# Patient Record
Sex: Male | Born: 1950 | Race: Black or African American | Hispanic: No | Marital: Married | Smoking: Never smoker
Health system: Southern US, Community
[De-identification: ages and names within clinical notes are randomized; demographics above are authoritative.]

## PROBLEM LIST (undated history)

## (undated) DIAGNOSIS — F329 Major depressive disorder, single episode, unspecified: Secondary | ICD-10-CM

## (undated) DIAGNOSIS — F0390 Unspecified dementia without behavioral disturbance: Secondary | ICD-10-CM

## (undated) DIAGNOSIS — F259 Schizoaffective disorder, unspecified: Secondary | ICD-10-CM

## (undated) DIAGNOSIS — I10 Essential (primary) hypertension: Secondary | ICD-10-CM

## (undated) DIAGNOSIS — E119 Type 2 diabetes mellitus without complications: Secondary | ICD-10-CM

---

## 2002-03-08 ENCOUNTER — Inpatient Hospital Stay (HOSPITAL_COMMUNITY): Admission: EM | Admit: 2002-03-08 | Discharge: 2002-03-15 | Payer: Self-pay | Admitting: Psychiatry

## 2004-03-13 ENCOUNTER — Ambulatory Visit: Payer: Self-pay | Admitting: Psychiatry

## 2004-03-13 ENCOUNTER — Inpatient Hospital Stay (HOSPITAL_COMMUNITY): Admission: AD | Admit: 2004-03-13 | Discharge: 2004-03-20 | Payer: Self-pay | Admitting: Psychiatry

## 2004-03-15 ENCOUNTER — Ambulatory Visit (HOSPITAL_COMMUNITY): Admission: RE | Admit: 2004-03-15 | Discharge: 2004-03-15 | Payer: Self-pay | Admitting: Psychiatry

## 2004-04-11 ENCOUNTER — Ambulatory Visit: Payer: Self-pay | Admitting: Psychiatry

## 2004-04-11 ENCOUNTER — Inpatient Hospital Stay (HOSPITAL_COMMUNITY): Admission: AD | Admit: 2004-04-11 | Discharge: 2004-04-26 | Payer: Self-pay | Admitting: Psychiatry

## 2004-06-24 ENCOUNTER — Inpatient Hospital Stay (HOSPITAL_COMMUNITY): Admission: AD | Admit: 2004-06-24 | Discharge: 2004-06-27 | Payer: Self-pay | Admitting: Psychiatry

## 2004-06-24 ENCOUNTER — Ambulatory Visit: Payer: Self-pay | Admitting: Psychiatry

## 2004-06-24 ENCOUNTER — Emergency Department (HOSPITAL_COMMUNITY): Admission: EM | Admit: 2004-06-24 | Discharge: 2004-06-24 | Payer: Self-pay | Admitting: *Deleted

## 2006-07-29 ENCOUNTER — Emergency Department (HOSPITAL_COMMUNITY): Admission: EM | Admit: 2006-07-29 | Discharge: 2006-07-29 | Payer: Self-pay | Admitting: Emergency Medicine

## 2006-07-29 ENCOUNTER — Inpatient Hospital Stay (HOSPITAL_COMMUNITY): Admission: AD | Admit: 2006-07-29 | Discharge: 2006-08-14 | Payer: Self-pay | Admitting: Psychiatry

## 2006-07-29 ENCOUNTER — Ambulatory Visit: Payer: Self-pay | Admitting: Psychiatry

## 2010-07-26 NOTE — Discharge Summary (Signed)
NAME:  Stanley Watkins, Stanley Watkins                   ACCOUNT NO.:  [id number]   MEDICAL RECORD NO.:  [id number]                   PATIENT TYPE:  IPS   LOCATION:  0404                                 FACILITY:  [facility]   PHYSICIAN:  Jeanice Lim, M.D.              DATE OF BIRTH:  03/09/1951   DATE OF ADMISSION:  03/08/2002  DATE OF DISCHARGE:  03/15/2002                                 DISCHARGE SUMMARY   IDENTIFYING DATA:  This is a 60 year old African-American male involuntarily  committed.  He was paranoid, hallucinating, noncompliant with medications,  behavior bizarre.   MEDICATIONS:  None.   DRUG ALLERGIES:  No known drug allergies.   PHYSICAL EXAMINATION:  GENERAL: Essentially within normal limits.  Difficult  to evaluate due to psychosis.  NEUROLOGIC: Nonfocal.   LABORATORY DATA:  Routine admission labs were essentially within normal  limits including CBC and CMET.   MENTAL STATUS EXAM:  Alert male, poor eye contact, casually dressed, mute.  Appeared somewhat withdrawn and possibly depressed versus negative symptoms.  Thought process: Difficult to evaluate due to mute presentation.  Cognitive:  The patient appeared to be most likely intact.  Judgment and insight: Poor.   ADMISSION DIAGNOSES:   AXIS I:  Schizoaffective disorder, depressed.   AXIS II:  None.   AXIS III:  None.   AXIS IV:  Moderate problems with primary support group.   AXIS V:  25/68   HOSPITAL COURSE:  The patient was admitted, ordered routine p.r.n.  medications, underwent further monitoring, and was encouraged to participate  in individual, group, and milieu therapy.  The patient was resumed on  Risperdal and given Ativan and Zyprexa p.r.n. agitation.  Risperdal was  optimized and changed to a wafer to ensure compliance and Cogentin p.r.n.  any extrapyramidal symptoms.  Risperdal was further titrated and Zoloft was  added for possible depressive symptoms.  Ativan was added due to old records  showing the patient had responded well to Ativan, antipsychotics, and  antidepressants.  The patient did show improvement tolerating medications  without side effects, participating some in aftercare planning.   CONDITION ON DISCHARGE:  The patient's condition on discharge was improved.  The patient was less paranoid, no overt psychotic symptoms, denied dangerous  ideation.  Mood was more euthymic.  Affect: Slightly brighter; the patient  no longer appeared to be in distress and was able to maintain eye contact.  The patient reported motivation to be compliant with medications and was  discharged.   DISCHARGE MEDICATIONS:  1. Zoloft 50 mg q.a.m.  2. Ativan 0.5 mg t.i.d. p.r.n. anxiety.  3. Risperdal 2 mg one half q.a.m., one half at 3 p.m., and one q.h.s.  4. Ambien one q.h.s. p.r.n.   FOLLOW UP:  The patient was to follow up at [facility] on January 8 with Dr. Lang Snow.   DISCHARGE DIAGNOSES:   AXIS I:  Schizoaffective disorder, depressed.  AXIS II:  None.   AXIS III:  None.   AXIS IV:  Moderate problems with primary support group.   AXIS V:  Global assessment of functioning on discharge was 59-55.                                                Jeanice Lim, M.D.    JEM/MEDQ  D:  03/28/2002  T:  03/28/2002  Job:  [id number]

## 2010-07-26 NOTE — H&P (Signed)
NAME:  Stanley Watkins, DIRR NO.:  [id number]   MEDICAL RECORD NO.:  [id number]          PATIENT TYPE:  IPS   LOCATION:  0401                          FACILITY:  [facility]   PHYSICIAN:  [name], M.D.      DATE OF BIRTH:  March 16, 1950   DATE OF ADMISSION:  06/24/2004  DATE OF DISCHARGE:                         PSYCHIATRIC ADMISSION ASSESSMENT   IDENTIFYING INFORMATION:  A 60 year old married African-American male,  involuntarily committed on June 24, 2004.   HISTORY OF PRESENT ILLNESS:  The patient presents with a history of  petition.  The patient was petitioned per his wife as the patient was not  drinking or eating for the past several days and not taking his medications.  The patient is mute and sleeping all the time.  Also reports that the  patient has had a 12 pound weight loss.  There are no specific stressors.  The patient again has been noncompliant with his medications for at least 2  weeks, no apparent suicide ideation or psychotic symptoms.   PAST PSYCHIATRIC HISTORY:  Third admission to [facility].  The  patient was here recently for similar symptoms.  He sees Dr. Allyne Gee at  [facility].   SOCIAL HISTORY:  He is a 60 year old married African-American male currently  living with his wife.  The remainder of social history is unclear at this  time.   FAMILY HISTORY:  Denies.   ALCOHOL DRUG HISTORY:  There is no apparent alcohol or drug use.   PAST MEDICAL HISTORY:  Primary care Josi Roediger is unknown. Medical problems  are none.   MEDICATIONS:  The patient was on Risperdal 1 mg in the morning, 2 mg at  bedtime, Celexa 20 mg daily, Cogentin 1 mg b.i.d. and a Risperdal Consta  injection which the patient apparently has not been taking.   DRUG ALLERGIES:  No known allergies.   PHYSICAL EXAMINATION:  The patient was assessed at [facility] ED for  concerns of dehydration.  The patient appears fairly hydrated.  He was  uncooperative in triage area in emergency department, would not allow his  pulse oximetry and temperature to be taken.  His temperature is 97.8, 87  heart rate, 16 respirations, blood pressure is 133/80.  180 pounds.  The  patient is currently sleeping, in no acute distress.  His urine drug screen  is negative.  CMET within normal limits.  Urinalysis was negative.  CBC is  within normal limits.   MENTAL STATUS EXAM:  The patient is in the bed, will not respond, catatonic.  The patient is mute.  Thought processes and mood and affect, unable to  ascertain.  The patient is a poor historian.  Judgment and poor, insight is  poor.   ADMISSION DIAGNOSES:   AXIS I:  Schizoaffective disorder   AXIS II:  Deferred.   AXIS III:  None.   AXIS IV:  Unclear at this time.   AXIS V:  Current is 20, past year 71.   PLAN:  Stabilize mood and thinking.  Will monitor intake.  Will contact wife  for background information and any significant stressors.  Will monitor labs  as needed.  The patient will be placed on 400 hall for close monitoring.  Will consider forced medications.  The patient is to be medication compliant  and to follow up with mental health.   TENTATIVE LENGTH OF CARE:  5-7 days.      JO/MEDQ  D:  06/27/2004  T:  06/27/2004  Job:  [id number]

## 2010-07-26 NOTE — Discharge Summary (Signed)
NAME:  Stanley Watkins, Stanley Watkins NO.:  [id number]   MEDICAL RECORD NO.:  [id number]          PATIENT TYPE:  IPS   LOCATION:  0407                          FACILITY:  [facility]   PHYSICIAN:  [name], M.D.      DATE OF BIRTH:  1950/05/09   DATE OF ADMISSION:  03/13/2004  DATE OF DISCHARGE:  03/20/2004                                 DISCHARGE SUMMARY   CHIEF COMPLAINT/HISTORY OF PRESENT ILLNESS:  This is the second admission to  [facility] for this 60 year old married African-American  male involuntarily committed.  He presents with a history of being  depressed, poor eating, poor sleeping, poor hygiene, not responding  verbally, isolating, withdrawing to a point that he was not functioning at  home for which inpatient treatment was recommended.  He has a history of  depression, had been hospitalized 2 years prior to this admission at [facility].  There was apparently a history of similar responses  when he was under stress in the past.   PAST PSYCHIATRIC HISTORY:  As already stated.  [facility]  in 2003 for psychosis.  He was paranoid, hallucinating and noncompliant with  medication.   PAST MEDICAL HISTORY:  Denies.   ALCOHOL OR DRUG USE:  There is no evidence of alcohol or drug use.   PAST SURGICAL HISTORY:  Noncontributory.   MEDICATIONS:  In December 2003, he was Zoloft 50 mg per day, Risperdal 2 mg  1/2 tablet in the morning and 1/2 tablet at 2 p.m. and 1 at night.  Ativan  0.5 mg three times a day.   PHYSICAL EXAMINATION:  Exam is performed with no acute findings.   LABORATORY:  CBC was within normal limits.  Blood chemistries, glucose 107.  Liver profile within normal limits.  TSH 2.277.  CT scan showed no acute  evidence of intracranial abnormalities.   MENTAL STATUS EXAM:  Reveals a male who was completely withdrawn, mute,  unable to cooperate with initial admission or following simple commands such  at  sit here, come into the room.  No eye contact.  No spontaneous verbal  communication.   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   AXIS II:  No diagnosis.   AXIS III:  No diagnosis.   AXIS IV:  Moderate.   AXIS V:  Global assessment of function on admission 20.  Highest within the  last year 60.   HOSPITAL COURSE:  He was involuntarily committed.  He was started in  supportive psychotherapy.  He was offered Ambien for sleep.  He was given  Zyprexa was placed at 5 in the morning and 5 at night.  He was given some  Ativan as needed for anxiety.  He was also started on Zoloft 25 mg per day.  Zyprexa was eventually discontinued.  He was placed on Risperdal.  As  already stated, initially he was unresponsive.  He would open his eyes when  his name was called, no eye contact.  Obvious evidencing self-neglect, not  taking a bath for awhile.  Dehydrated, refused fluids.  Eventually he was  able to accept fluids and food.  He would not answer questions.  He was  selective in whom he would initiate a couple of words.  He would not offer  more information.  There was psychomotor retardation.  We continued to be  sure that he was hydrated and he had enough nutrients.  He continued to  spend most of the time in bed, eyes closed, mute.  Continued to be isolated,  eye open, would not respond to questions.  Started being able to go to the  cafeteria to eat lunch and dinner and went back to bed.  He would not admit  if he was depressed or hearing voices.  There was an interview with his wife  over the phone.  Apparently, he has been experiencing depression for 17  years to his admission.  Apparently, he had a nervous breakdown, problems  with his job, paranoid of coworkers, neighbors, Nurse, adult.  He felt like he is  a newborn baby as a robot.  He had some delusions, so he was admitted to  [facility].  Over the past 17 years he has been hospitalized several   times at [facility], [place], [facility], __________.  Last episode was  Christmas 2 years prior to this admission.  Wife stated that he will change  _________ bottom on and off.  He has never been compliant with medications.  Apparently one of the triggers could be his interaction with his mother  during Christmas.  She suffers from Alzheimer's and is totally dependent on  others for care.  Apparently it was very hard for him to see her.  The wife  saw increased sleep, isolating, not eating, no showering.  He continued to  stabilize.  He was compliant with the medications and on January 11, he was  better, but he was wanting to be discharged.  It was felt that he would  recover faster if he was in more familiar surroundings.  He was endorsing  that there was no evidence of suicidal or homicidal ideation.  He was more  spontaneous.  He was eating.  He was sleeping, keeping up with his hygiene.  He felt that he was stable enough that he could be discharged to outpatient  treatment.   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   AXIS II:  No diagnosis.   AXIS III:  No diagnosis.   AXIS IV:  Moderate.   AXIS V:  Global assessment of function upon discharge 50.   DISCHARGE MEDICATIONS:  1.  Zoloft 50 mg per day.  2.  Ambien 10 at night for sleep.  3.  Risperdal M-tab 0.5, 1 in the morning and 2 at night.   FOLLOWUP:  Dr. Milagros Evener.      IL/MEDQ  D:  04/09/2004  T:  04/09/2004  Job:  [id number]

## 2010-07-26 NOTE — Discharge Summary (Signed)
NAME:  SIMPSON, PAULOS NO.:  [id number]   MEDICAL RECORD NO.:  [id number]          PATIENT TYPE:  IPS   LOCATION:  0401                          FACILITY:  [facility]   PHYSICIAN:  [name], M.D.      DATE OF BIRTH:  April 16, 1950   DATE OF ADMISSION:  06/24/2004  DATE OF DISCHARGE:  06/27/2004                                 DISCHARGE SUMMARY   CHIEF COMPLAINT AND PRESENTING ILLNESS:  This was the third admission to  [facility]  for this 60 year old married African-American  male, involuntarily committed.  Was petitioned per his wife as the patient  was not drinking or eating for the past several days and not taking his  medications.  He was mute and sleeping all the time.  He has had 12 pound  weight loss.  No specific stressors.  Noncompliant with the medications for  at least 2 weeks.   PAST PSYCHIATRIC HISTORY:  Third time [employer].  Sees Dr. Allyne Gee  at [facility].   ALCOHOL AND DRUG HISTORY:  No apparently alcohol or drug use.   PAST MEDICAL HISTORY:  Noncontributory.   MEDICATIONS:  Risperdal 1 mg in the morning, 2 at night, Celexa 20 mg per  day, Cogentin 1 mg twice a day, Risperdal Consta which he has not been  taking.   PHYSICAL EXAMINATION:  Performed, failed to show any acute findings.   LABORATORY WORKUP:  CBC:  White blood cells 4.6, hemoglobin 16.1.  Blood  chemistries were within normal limits.  Glucose 76.  Liver enzymes:  SGOT  16, SGPT 12. Drug screening negative for substances of abuse.   MENTAL STATUS EXAM:  Reveals a male, in bed, not responding, mute.  Thought  process and mood unable to ascertain.  Affect was pretty constricted, not as  spontaneous.  Endorsed no hallucinations.  Cognition could not be evaluated  due to his inability to communicate.   ADMISSION DIAGNOSES:   AXIS I:  Schizoaffective disorder versus major depression with psychotic  features.   AXIS II:  No  diagnosis.   AXIS III:  No diagnosis.   AXIS IV:  Moderate.   AXIS V:  Global assessment of function upon admission 20 highest global  assessment of function in past year 65.   COURSE IN HOSPITAL:  He was admitted and started on individual and group  psychotherapy.  He was given Ambien for sleep.  He was re-ordered Risperdal  Consta 37.5 IM and placed on Seroquel 150 mg per day and Cogentin 1 mg 3  times a day.  He was also given Ativan 0.5 4 times a day.  He seemed to be  responding to internal stimuli, not as spontaneous, not answering questions,  little eye contact.  On April 19 he was more communicative.  He said he was  doing okay, claimed he was taking his medications but the information we got  said that he was not.  He endorsed that he wanted to go home.  We wanted him  to take the Consta.  Initially he refused to, but later he agreed  and he  was given Consta 50 mg on April 19.  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  On April 20, he was endorsing that he  wanted to leave.  He got the IM and indeed he had communicated verbally more  so than the last time.  He was denying any suicidal or homicidal ideas.  He  was going to be followed by Dr. Allyne Gee and the [facility] Team.   DISCHARGE DIAGNOSES:   AXIS I:  Major depression with psychotic features versus schizoaffective  disorder, depressed.   AXIS II:  No diagnosis.   AXIS III:  No diagnosis.   AXIS IV:  Moderate.   AXIS V:  Global assessment of function upon discharge 45-50.   DISCHARGE MEDICATIONS:  1.  Zoloft 100 mg 1.5 daily.  2.  Cogentin 1 mg 3 times a day.  3.  Ativan 0.5 4 times a day.  4.  Risperdal M-Tab 1 mg twice a day and 2 mg at night.  5.  Consta 50 mg IM given April 19.   DISPOSITION:  Follow up at [facility].      IL/MEDQ  D:  07/27/2004  T:  07/27/2004  Job:  [id number]

## 2010-07-26 NOTE — Discharge Summary (Signed)
NAME:  Stanley Watkins, Stanley Watkins         ACCOUNT NO.:  [id number]   MEDICAL RECORD NO.:  [id number]          PATIENT TYPE:  IPS   LOCATION:  0302                          FACILITY:  [facility]   PHYSICIAN:  Anselm Jungling, MD  DATE OF BIRTH:  02/25/1961   DATE OF ADMISSION:  07/29/2006  DATE OF DISCHARGE:  08/14/2006                               DISCHARGE SUMMARY   IDENTIFYING DATA AND REASON FOR ADMISSION:  This was an inpatient  psychiatric admission for Stanley Watkins, a 60 year old married male  with a history of schizoaffective disorder.  He came to [place] as a client  of [facility], where he sees Dr. Allyne Gee.  He was  admitted due to severe psychotic decompensation involving mutism,  withdrawal, and decreased p.o. intake.  He had come to [place] on a regimen  of Invega and Zoloft, doses unclear.  Please refer to the admission note  for further details pertaining to the symptoms, circumstances and  history that led to his hospitalization.  He was given initial Axis I  diagnosis of schizoaffective disorder NOS, with catatonic presentation.   MEDICAL AND LABORATORY:  The patient was medically and physically  assessed by the psychiatric nurse practitioner.  He was in good health  without any active or chronic medical problems.  There were no  significant medical issues during this inpatient psychiatric stay.   HOSPITAL COURSE:  The patient was admitted to the adult inpatient  psychiatric service.  He presented as a very withdrawn, guarded male who  was well-nourished and normally developed.  He was completely  unresponsive verbally; although, his eyes were open, and he appeared to  be aware of the presence of others around him.   He had previously been on a regimen of Invega and had reportedly been  doing well with this, but apparently stopped taking his medication for  reasons that were never clear.  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   His treatment was challenging, in that he appeared to be taking his  medication during the first several days of his inpatient stay.  He  remained isolative, mute, withdrawn, and spending most of his time in  bed.  We were concerned about his p.o. intake.  Eventually, we found it  necessary to change from oral Risperdal to liquid Haldol concentrate to  make sure that he was getting adequate doses of antipsychotic  medication.  As the patient began to take medication more consistently,  he gradually became less withdrawn and more verbal.  His p.o. intake  improved.   Towards the end of his hospital stay, we began to discuss with the  patient the possibility of getting on Risperdal Consta, which would  obviate the need for daily oral medication.  The patient was reluctant  to agree to this, but his wife insisted, as she has had apparently made  experiences in the past of the patient going off of his medication and  then needing hospitalization.  The patient was discharged on the 17th  hospital day and agreed to the following aftercare plan.   AFTERCARE:  The  patient was to follow-up at the [facility] with an  appointment with Dr. Lang Snow on 08/18/2006.  The [facility] was to  provide other services as indicated.   DISCHARGE MEDICATIONS:  Invega 9 mg at bedtime, Cogentin 1 mg b.i.d.,  Zoloft 50 mg daily, and Risperdal Consta 50 mg IM every 2 weeks, next  due 08/28/2006.   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  AXIS II:  Deferred.  AXIS III:  No acute or chronic illnesses.  AXIS IV:  Stressors severe.  AXIS V:  GAF on discharge 60.      Anselm Jungling, MD  Electronically Signed     SPB/MEDQ  D:  08/26/2006  T:  08/27/2006  Job:  [id number]

## 2010-07-26 NOTE — H&P (Signed)
NAME:  NAJEEB, UPTAIN                   ACCOUNT NO.:  [id number]   MEDICAL RECORD NO.:  [id number]                   PATIENT TYPE:  IPS   LOCATION:  0404                                 FACILITY:  BH   PHYSICIAN:  Jeanice Lim, M.D.              DATE OF BIRTH:  Mar 21, 1950   DATE OF ADMISSION:  03/08/2002  DATE OF DISCHARGE:                         PSYCHIATRIC ADMISSION ASSESSMENT   IDENTIFYING INFORMATION:  This is 60 year old African-American male  involuntarily committed on March 08, 2002.   HISTORY OF PRESENT ILLNESS:  The patient presents on petition papers that  report patient has a 15-year history of paranoia, hallucinations,  withdrawal.  He has been noncompliant with his medications.  He has been  behaving bizarrely, changing clothes frequently, wandering the street and  has been extremely guarded.  Chart documents state that patient has  apparently been depressed over two deaths recently.  The patient was mute  and will not offer any information.   PAST PSYCHIATRIC HISTORY:  Fifteen-year history of psychiatric illness.  Sees Dr. Mila Homer at [facility].  Has been hospitalized eight  times at [facility].   SOCIAL HISTORY:  This is a 60 year old African-American male.  He is married  and works at the [employer].   FAMILY HISTORY:  Unclear.   ALCOHOL/DRUG HISTORY:  There is no apparent drug use.   PRIMARY CARE PHYSICIAN:  Unknown.   MEDICAL PROBLEMS:  None apparent.   MEDICATIONS:  None.   ALLERGIES:  No known allergies.   PHYSICAL EXAMINATION:  Deferred at this time due to patient's psychosis and  patient's unpredictable behavior.  He appears in no acute distress.  His  vital signs seem stable with temperature 97.6, pulse 100, respiratory rate  24, blood pressure 114/85 and he is 232 pounds.   LABORATORY DATA:  CBC is within normal limits.  CMET is within normal  limits.   MENTAL STATUS EXAM:  He is alert.  He  is standing on the side of the bed.  He has a food tray in front of him, most of the food is uneaten.  There is  no eye contact and no sign that patient is realizing I am talking to him.  He is mute.  He appears withdrawn and has a sad expression.  Thought  processes unable to ascertain hallucinations.  Cognitive function intact.  Memory is uncertain.  Judgment and insight is poor.   DIAGNOSES:    AXIS I:  1. Psychosis not otherwise specified.  2. Rule out schizoaffective disorder, depressed.   AXIS II:  Deferred.   AXIS III:  None.   AXIS IV:  Problems with primary support group, other psychosocial problems,  noncompliance with medication.   AXIS V:  Current 25; this past year 3.   PLAN:  Voluntary admit for psychosis.  Contract for safety.  Check every 15  minutes.  The patient to be on the [address].  Will resume Risperdal to  target psychotic symptoms.  Will contact the wife for background  information.  To stabilize mood and thinking to patient can be safe and  functional.  Return to prior living arrangements.  Have a family session  prior to discharge.  The patient to follow up with mental health and to be  medication-compliant.   TENTATIVE LENGTH OF STAY:  Four to six days or more depending on patient's  response to medication.      Landry Corporal, N.P.                       Jeanice Lim, M.D.    JO/MEDQ  D:  03/10/2002  T:  03/10/2002  Job:  [id number]

## 2010-07-26 NOTE — Discharge Summary (Signed)
NAME:  Stanley Watkins, WEINERT NO.:  [id number]   MEDICAL RECORD NO.:  [id number]          PATIENT TYPE:  IPS   LOCATION:  0406                          FACILITY:  [facility]   PHYSICIAN:  [name], M.D.      DATE OF BIRTH:  1951/01/17   DATE OF ADMISSION:  04/11/2004  DATE OF DISCHARGE:  04/26/2004                                 DISCHARGE SUMMARY   CHIEF COMPLAINT/HISTORY OF PRESENT ILLNESS:  This is patient's third  admission to [facility] for this 60 year old  married African-American male involuntarily committed.  Diagnosed with  schizo-affective disorder and has two recent hospitalizations, brought to  mental health by his wife endorsing that he was not functional, not  speaking, eating or drinking.  He has history of stressors of mother's  Alzheimer's disease, history of stopping medications when he feels better,  nonverbal, nonresponsive, cannot give a history upon admission.   PAST PSYCHIATRIC HISTORY:  Followed by Dr. Ezzard Flax, noncompliant.  Third  time to behavioral health center, last time was March 20, 2004.  He was  also hospitalized in 1984   ALCOHOL OR DRUG HISTORY:  Denies use or abuse of any substances.   PAST MEDICAL HISTORY:  Noncontributory.   MEDICATIONS:  1.  Risperdal M-tab 0.5 in the morning and 1 mg at night.  2.  Zoloft 50 mg per day.  3.  Ambien 10 mg at bedtime for sleep.   PHYSICAL EXAMINATION:  Performed and failed to show any acute findings.   LABORATORY WORKUP:  Labs were not repeated as he was recently hospitalized.   MENTAL STATUS EXAM:  Revealed a male with eyes open, blinks rapidly, no  spontaneous content, no eye contract, no verbal communications.  He did not  respond verbally or to simple commands.  Speech is nonverbal.  Mood  withdrawn, noncommunicative.  Thought process no spontaneous content.  Cognition could not be tested due to his being unresponsive verbally.   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   AXIS II:  No diagnosis.   AXIS III:  No diagnosis.   AXIS IV:  Moderate.   AXIS V:  Upon admission 15-20, highest in the last year 60.   COURSE IN HOSPITAL:  He was admitted and started in group psychotherapy.  He  was maintained on his medications, Ambien 10 at night, Risperdal M-tab 0.5  in the morning and 1 mg at night, Cogentin 2 mg as needed for EPS, Zoloft 50  mg per day.  He was also given Ativan as needed.  Risperdal was eventually  changed to 1 mg in the morning and 1.5 at night.  We continued to work with  the Zoloft; that was increased to 75 mg per day.  He was placed on Risperdal  Consta on February 7, and we continued to work with the Zoloft increasing it  as tolerated.  As already stated, initially he was noncommunicative, most of  the time lying in bed, not responsive to verbal stimuli, but able to look at  the  person wanting to interact.  He was compliant with taking his  medications.  Most of the hospitalization he remained verbally  uncommunicative, acknowledged depression.  He seemed to start sleeping  better.  With a lot of encouragement he started taking care of his hygiene.  There were some attempts to communicate with several staff members, but  would not elaborate.  It was almost like he would say a couple of words and  then not produce anything else.  He was visually engaged.  He seemed to  understand what he was being asked, seemed to be involved in some internal  stimuli.  He would deny if he was hearing voices or he was responding to  what seemed to delusional ideations.  As the hospitalization progressed, he  was a little bit more communicative with some staff.  There were attempts to  communicate verbally, and then he would stop, but he continued to be  compliant with medications.  We up to Zoloft 150 mg in the morning and  Risperdal M-tab 1 mg in the  afternoon and 2.5 mg at night.  He endorsed in  one of the better days that he wanted to be the way he was before, but  attempts to pursue this light of thought resulted in him not answering or  saying anything else.  We started trying to encourage him to endorse that he  would be discharged as soon as he was able to demonstrate that he could  communicate, but even then he was not able to do so.  We went up to 200 mg  of Zoloft.  We went up to Risperdal 3 mg per day.  We had him on Consta 25  mg IM.  Apparently he became a little more interactive with his wife.  It  was felt that he had obtained full benefit from the hospitalization, so on  April 26, 2004 he was discharged.  He will still hesitant, but able to  establish more of a conversation, had been compliant with his medication,  eating, drinking, taking care of his ADLs.  We went ahead and discharged him  to outpatient followup.   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   AXIS II:  No diagnosis.   AXIS III:  No diagnosis.   AXIS IV:  Moderate.   AXIS V:  45 on discharge.   DISCHARGE MEDICATIONS:  1.  Ativan 0.5 mg three times a day.  2.  Risperdal M-tabs 1 mg in the morning, in the afternoon, and 2 at night.  3.  Zoloft 200 mg per day.  4.  Cogentin 1 mg three times a day.  5.  Consta 37.5 IM every 2 weeks, last dose April 16, 2004.   FOLLOW UP:  Dr. Mikey Bussing at [facility].      IL/MEDQ  D:  05/28/2004  T:  05/28/2004  Job:  [id number]

## 2012-04-28 ENCOUNTER — Encounter (HOSPITAL_COMMUNITY): Payer: Self-pay | Admitting: Emergency Medicine

## 2012-04-28 ENCOUNTER — Emergency Department (HOSPITAL_COMMUNITY): Payer: Self-pay

## 2012-04-28 ENCOUNTER — Emergency Department (HOSPITAL_COMMUNITY)
Admission: EM | Admit: 2012-04-28 | Discharge: 2012-04-28 | Payer: Self-pay | Attending: Emergency Medicine | Admitting: Emergency Medicine

## 2012-04-28 DIAGNOSIS — Z008 Encounter for other general examination: Secondary | ICD-10-CM | POA: Insufficient documentation

## 2012-04-28 DIAGNOSIS — F319 Bipolar disorder, unspecified: Secondary | ICD-10-CM | POA: Insufficient documentation

## 2012-04-28 DIAGNOSIS — F411 Generalized anxiety disorder: Secondary | ICD-10-CM | POA: Insufficient documentation

## 2012-04-28 LAB — COMPREHENSIVE METABOLIC PANEL
ALT: 22 U/L (ref 0–53)
Alkaline Phosphatase: 80 U/L (ref 39–117)
BUN: 13 mg/dL (ref 6–23)
CO2: 26 mEq/L (ref 19–32)
Chloride: 100 mEq/L (ref 96–112)
GFR calc Af Amer: >90 mL/min (ref >90)
GFR calc non Af Amer: 78 mL/min — ABNORMAL LOW (ref >90)
Glucose, Bld: 140 mg/dL — ABNORMAL HIGH (ref 70–99)
Potassium: 3.7 mEq/L (ref 3.5–5.1)
Sodium: 136 mEq/L (ref 135–145)
Total Bilirubin: 0.2 mg/dL — ABNORMAL LOW (ref 0.3–1.2)
Total Protein: 7.8 g/dL (ref 6.0–8.3)

## 2012-04-28 LAB — CBC WITH DIFFERENTIAL/PLATELET
Basophils Relative: 0 % (ref 0–1)
Eosinophils Absolute: 0.3 10*3/uL (ref 0.0–0.7)
Hemoglobin: 15.1 g/dL (ref 13.0–17.0)
MCH: 27.7 pg (ref 26.0–34.0)
MCHC: 34.2 g/dL (ref 30.0–36.0)
Monocytes Absolute: 0.6 10*3/uL (ref 0.1–1.0)
Monocytes Relative: 7 % (ref 3–12)
Neutrophils Relative %: 59 % (ref 43–77)
RDW: 15.5 % (ref 11.5–15.5)

## 2012-04-28 LAB — URINALYSIS, ROUTINE W REFLEX MICROSCOPIC
Bilirubin Urine: NEGATIVE
Ketones, ur: NEGATIVE mg/dL
Nitrite: NEGATIVE
Protein, ur: NEGATIVE mg/dL
pH: 5.5 (ref 5.0–8.0)

## 2012-04-28 LAB — RAPID URINE DRUG SCREEN, HOSP PERFORMED: Opiates: NOT DETECTED

## 2012-04-28 NOTE — ED Notes (Signed)
Pt is IVC  Papers state pt has been literally where he will not communicate, not performing daily care, eating very little

## 2012-04-28 NOTE — ED Provider Notes (Signed)
History    This chart was scribed for Stanley Gourd, PA-C, non-physician practitioner working with Stanley Anger, DO by Stanley Watkins, ED Scribe. This patient was seen in room WTR4/WLPT4 and the patient's care was started at 2043.    CSN: [id number]  Arrival date & time 04/28/12  2000   First MD Initiated Contact with Patient 04/28/12 2043      Chief Complaint  Patient presents with  . Medical Clearance   Level V Caveat: Pt is non-verbal   History provided by: IVC papers and accompaning security. No language interpreter was used.   Stanley Watkins is a 62 y.o. male who presents to the Emergency Department for medical clearance. Deputy picked the pt up at the home and took the him to [place]. Wife took out the IVC papers on the pt due to him not talking, not eating or taking care of himself in regards to personal hygiene. Pt was sent here from [facility] for medical clearance. IVC papers states that the pt suffers from manic depression and anxiety and that the pt has been non-verbal, occasionally whispers to himself, has no regard for personal hygiene, eating little and calls people on the phone but doesn't speak. IVC papers state that the he is danger to himself.   History reviewed. No pertinent past medical history.  No past surgical history on file.  No family history on file.  History  Substance Use Topics  . Smoking status: Not on file  . Smokeless tobacco: Not on file  . Alcohol Use: Not on file      Review of Systems  Unable to perform ROS: Patient nonverbal    Allergies  Review of patient's allergies indicates not on file.  Home Medications  No current outpatient prescriptions on file.  There were no vitals taken for this visit.  Physical Exam  Nursing note and vitals reviewed. Constitutional: He appears well-developed and well-nourished. No distress.  HENT:  Head: Normocephalic and atraumatic.  Eyes: Conjunctivae are normal. Pupils are  equal, round, and reactive to light.  Neck: Normal range of motion. Neck supple. No tracheal deviation present.  Cardiovascular: Normal rate, regular rhythm and normal heart sounds.   Pulmonary/Chest: Effort normal and breath sounds normal. No respiratory distress.  Abdominal: Soft. Bowel sounds are normal. He exhibits no distension.  Musculoskeletal: Normal range of motion. He exhibits no edema.  Neurological: He is alert.  Skin: Skin is warm and dry.  Psychiatric: He is withdrawn. He is noncommunicative.  Poor eye contact    ED Course  Procedures (including critical care time)  COORDINATION OF CARE:  21:00-Preformed physical exam. A chest x-ray, ethanol and drug screen, CMP, CBC and UA have been ordered.    Results for orders placed during the hospital encounter of 04/28/12  URINE RAPID DRUG SCREEN (HOSP PERFORMED)      Result Value Range   Opiates NONE DETECTED  NONE DETECTED   Cocaine NONE DETECTED  NONE DETECTED   Benzodiazepines NONE DETECTED  NONE DETECTED   Amphetamines NONE DETECTED  NONE DETECTED   Tetrahydrocannabinol NONE DETECTED  NONE DETECTED   Barbiturates NONE DETECTED  NONE DETECTED  COMPREHENSIVE METABOLIC PANEL      Result Value Range   Sodium 136  135 - 145 mEq/L   Potassium 3.7  3.5 - 5.1 mEq/L   Chloride 100  96 - 112 mEq/L   CO2 26  19 - 32 mEq/L   Glucose, Bld 140 (*) 70 - 99 mg/dL  BUN 13  6 - 23 mg/dL   Creatinine, Ser 8.29  0.50 - 1.35 mg/dL   Calcium 9.0  8.4 - 56.2 mg/dL   Total Protein 7.8  6.0 - 8.3 g/dL   Albumin 3.5  3.5 - 5.2 g/dL   AST 17  0 - 37 U/L   ALT 22  0 - 53 U/L   Alkaline Phosphatase 80  39 - 117 U/L   Total Bilirubin 0.2 (*) 0.3 - 1.2 mg/dL   GFR calc non Af Amer 78 (*) >90 mL/min   GFR calc Af Amer >90  >90 mL/min  URINALYSIS, ROUTINE W REFLEX MICROSCOPIC      Result Value Range   Color, Urine YELLOW  YELLOW   APPearance CLEAR  CLEAR   Specific Gravity, Urine 1.021  1.005 - 1.030   pH 5.5  5.0 - 8.0   Glucose, UA  NEGATIVE  NEGATIVE mg/dL   Hgb urine dipstick NEGATIVE  NEGATIVE   Bilirubin Urine NEGATIVE  NEGATIVE   Ketones, ur NEGATIVE  NEGATIVE mg/dL   Protein, ur NEGATIVE  NEGATIVE mg/dL   Urobilinogen, UA 1.0  0.0 - 1.0 mg/dL   Nitrite NEGATIVE  NEGATIVE   Leukocytes, UA NEGATIVE  NEGATIVE  CBC WITH DIFFERENTIAL      Result Value Range   WBC 7.9  4.0 - 10.5 K/uL   RBC 5.46  4.22 - 5.81 MIL/uL   Hemoglobin 15.1  13.0 - 17.0 g/dL   HCT 13.0  86.5 - 78.4 %   MCV 80.8  78.0 - 100.0 fL   MCH 27.7  26.0 - 34.0 pg   MCHC 34.2  30.0 - 36.0 g/dL   RDW 69.6  29.5 - 28.4 %   Platelets 176  150 - 400 K/uL   Neutrophils Relative 59  43 - 77 %   Neutro Abs 4.7  1.7 - 7.7 K/uL   Lymphocytes Relative 30  12 - 46 %   Lymphs Abs 2.4  0.7 - 4.0 K/uL   Monocytes Relative 7  3 - 12 %   Monocytes Absolute 0.6  0.1 - 1.0 K/uL   Eosinophils Relative 4  0 - 5 %   Eosinophils Absolute 0.3  0.0 - 0.7 K/uL   Basophils Relative 0  0 - 1 %   Basophils Absolute 0.0  0.0 - 0.1 K/uL  ETHANOL      Result Value Range   Alcohol, Ethyl (B) <11  0 - 11 mg/dL    Dg Chest 2 View  1/32/4401  *RADIOLOGY REPORT*  Clinical Data: Medical clearance.  CHEST - 2 VIEW  Comparison: None available.  Findings: The heart size is normal.  Minimal left basilar atelectasis is present.  The lungs are otherwise clear. Degenerative changes are noted in the thoracic spine.  IMPRESSION:  1.  Mild left basilar atelectasis. 2.  Degenerative changes of the thoracic spine.   Original Report Authenticated By: Stanley Watkins, M.D.      1. Medical clearance for psychiatric admission       MDM  Patient medically cleared to return to [facility].    I personally performed the services described in this documentation, which was scribed in my presence. The recorded information has been reviewed and is accurate.     Stanley Mace, PA-C 04/28/12 2203

## 2012-04-28 NOTE — ED Notes (Signed)
Spoke to [place] on the phone  Told pt could return to facility  Copy of labs, ekg, and xray report sent with pt  Pt returned to [facility] with Marketing executive

## 2012-04-28 NOTE — ED Notes (Signed)
Pt sent here from [facility] for med clearance with [place] security  Pt will not answer questions in triage regarding medical history  Pt will not make eye contact

## 2012-04-29 NOTE — ED Provider Notes (Signed)
Medical screening examination/treatment/procedure(s) were performed by non-physician practitioner and as supervising physician I was immediately available for consultation/collaboration.   Laray Anger, DO 04/29/12 1144

## 2012-11-09 ENCOUNTER — Encounter (HOSPITAL_COMMUNITY): Payer: Self-pay | Admitting: Emergency Medicine

## 2012-11-09 ENCOUNTER — Emergency Department (HOSPITAL_COMMUNITY)
Admission: EM | Admit: 2012-11-09 | Discharge: 2012-11-11 | Disposition: A | Discharge status: Home / Self Care | Payer: 59 | Attending: Emergency Medicine | Admitting: Emergency Medicine

## 2012-11-09 DIAGNOSIS — F259 Schizoaffective disorder, unspecified: Secondary | ICD-10-CM | POA: Insufficient documentation

## 2012-11-09 DIAGNOSIS — F29 Unspecified psychosis not due to a substance or known physiological condition: Secondary | ICD-10-CM | POA: Insufficient documentation

## 2012-11-09 DIAGNOSIS — Z8659 Personal history of other mental and behavioral disorders: Secondary | ICD-10-CM | POA: Insufficient documentation

## 2012-11-09 DIAGNOSIS — F438 Other reactions to severe stress: Secondary | ICD-10-CM

## 2012-11-09 DIAGNOSIS — F4320 Adjustment disorder, unspecified: Secondary | ICD-10-CM

## 2012-11-09 HISTORY — DX: Type 2 diabetes mellitus without complications: E11.9

## 2012-11-09 HISTORY — DX: Major depressive disorder, single episode, unspecified: F32.9

## 2012-11-09 HISTORY — DX: Essential (primary) hypertension: I10

## 2012-11-09 HISTORY — DX: Schizoaffective disorder, unspecified: F25.9

## 2012-11-09 LAB — COMPREHENSIVE METABOLIC PANEL
AST: 18 U/L (ref 0–37)
Albumin: 4.1 g/dL (ref 3.5–5.2)
Calcium: 9.6 mg/dL (ref 8.4–10.5)
Creatinine, Ser: 1.18 mg/dL (ref 0.50–1.35)

## 2012-11-09 LAB — CBC
HCT: 45.9 % (ref 39.0–52.0)
Hemoglobin: 15.5 g/dL (ref 13.0–17.0)
MCH: 27.6 pg (ref 26.0–34.0)
MCHC: 33.8 g/dL (ref 30.0–36.0)
MCV: 81.7 fL (ref 78.0–100.0)
Platelets: 188 K/uL (ref 150–400)
RBC: 5.62 MIL/uL (ref 4.22–5.81)
RDW: 14.9 % (ref 11.5–15.5)
WBC: 6.4 K/uL (ref 4.0–10.5)

## 2012-11-09 LAB — RAPID URINE DRUG SCREEN, HOSP PERFORMED
Amphetamines: NOT DETECTED
Cocaine: NOT DETECTED
Opiates: NOT DETECTED
Tetrahydrocannabinol: NOT DETECTED

## 2012-11-09 LAB — ETHANOL: Alcohol, Ethyl (B): <11 mg/dL (ref 0–11)

## 2012-11-09 MED ORDER — POTASSIUM CHLORIDE CRYS ER 20 MEQ PO TBCR: 40.0000 meq | EXTENDED_RELEASE_TABLET | Freq: Once | ORAL | Status: DC

## 2012-11-09 MED ORDER — NICOTINE 21 MG/24HR TD PT24
21.0000 mg | MEDICATED_PATCH | Freq: Every day | TRANSDERMAL | Status: DC
  Administered 2012-11-10: 21 mg via TRANSDERMAL
  Filled 2012-11-09: qty 1

## 2012-11-09 MED ORDER — ZOLPIDEM TARTRATE 5 MG PO TABS: 5.0000 mg | ORAL_TABLET | Freq: Every evening | ORAL | Status: DC | PRN

## 2012-11-09 MED ORDER — LORAZEPAM 1 MG PO TABS
1.0000 mg | ORAL_TABLET | Freq: Three times a day (TID) | ORAL | Status: DC | PRN
  Administered 2012-11-09: 1 mg via ORAL
  Filled 2012-11-09 (×3): qty 1

## 2012-11-09 MED ORDER — ACETAMINOPHEN 325 MG PO TABS: 650.0000 mg | ORAL_TABLET | ORAL | Status: DC | PRN

## 2012-11-09 MED ORDER — IBUPROFEN 200 MG PO TABS: 600.0000 mg | ORAL_TABLET | Freq: Three times a day (TID) | ORAL | Status: DC | PRN

## 2012-11-09 NOTE — ED Notes (Signed)
Attempted to speak with pt., pt. Completely nonverbal, minimal eye contact.  Explained where the bathroom and call bell are.  Will continue to monitor pt. And assist when I can.

## 2012-11-09 NOTE — ED Notes (Signed)
Pt brought here by sheriff with IVC, pt won't talk, wife states pt is a danger to himself and others

## 2012-11-09 NOTE — ED Notes (Signed)
Oriented pt. To psych ED. 

## 2012-11-09 NOTE — ED Notes (Signed)
Pt will not converse with any staff; unable to assess psych assessment; pt does seem agitated, constantly moving leg and will not make eye contact or acknowledge staff presence
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
## 2012-11-09 NOTE — ED Provider Notes (Signed)
See prior note   Ward Givens, MD 11/09/12 1732

## 2012-11-09 NOTE — ED Provider Notes (Signed)
CSN: [id number]     Arrival date & time 11/09/12  1406 History   First MD Initiated Contact with Patient 11/09/12 1506     Chief Complaint  Patient presents with  . Medical Clearance   (Consider location/radiation/quality/duration/timing/severity/associated sxs/prior Treatment) HPI Stanley Watkins is a 63 y.o. male with history of schizoaffective disorder, who presents to ED by [employer] with IVC papers taken out by his wife. Pt refuses to talk. He does not answer any questions or even nod yes or no. According to the officer and wife, pt has not been sleeping well, poor hygiene, having hallucinations, having anger management problems. Pt with history tof the same, with prior IVCs and admissions to psychiatric facilities. No medical problems otherwise.    No past medical history on file. No past surgical history on file. No family history on file. History  Substance Use Topics  . Smoking status: Not on file  . Smokeless tobacco: Not on file  . Alcohol Use: Not on file    Review of Systems  Unable to perform ROS: Psychiatric disorder    Allergies  Review of patient's allergies indicates not on file.  Home Medications  No current outpatient prescriptions on file. BP 147/94  Pulse 90  Temp(Src) 98.7 F (37.1 C) (Oral)  Resp 20  SpO2 95% Physical Exam  Nursing note and vitals reviewed. Constitutional: He appears well-developed and well-nourished. No distress.  HENT:  Head: Normocephalic and atraumatic.  Eyes: Conjunctivae are normal.  Neck: Neck supple.  Cardiovascular: Normal rate, regular rhythm and normal heart sounds.   Pulmonary/Chest: Effort normal. No respiratory distress. He has no wheezes. He has no rales.  Abdominal: Bowel sounds are normal. He exhibits no distension.  Musculoskeletal: He exhibits no edema.  Neurological: He is alert.  Skin: Skin is warm and dry.  Psychiatric:  Pt will not answer any questions.    ED Course  Procedures (including critical  care time) Labs Review Results for orders placed during the hospital encounter of 11/09/12  CBC      Result Value Range   WBC 6.4  4.0 - 10.5 K/uL   RBC 5.62  4.22 - 5.81 MIL/uL   Hemoglobin 15.5  13.0 - 17.0 g/dL   HCT 40.9  81.1 - 91.4 %   MCV 81.7  78.0 - 100.0 fL   MCH 27.6  26.0 - 34.0 pg   MCHC 33.8  30.0 - 36.0 g/dL   RDW 78.2  95.6 - 21.3 %   Platelets 188  150 - 400 K/uL  COMPREHENSIVE METABOLIC PANEL      Result Value Range   Sodium 135  135 - 145 mEq/L   Potassium 3.3 (*) 3.5 - 5.1 mEq/L   Chloride 101  96 - 112 mEq/L   CO2 24  19 - 32 mEq/L   Glucose, Bld 109 (*) 70 - 99 mg/dL   BUN 11  6 - 23 mg/dL   Creatinine, Ser 0.86  0.50 - 1.35 mg/dL   Calcium 9.6  8.4 - 57.8 mg/dL   Total Protein 8.0  6.0 - 8.3 g/dL   Albumin 4.1  3.5 - 5.2 g/dL   AST 18  0 - 37 U/L   ALT 19  0 - 53 U/L   Alkaline Phosphatase 71  39 - 117 U/L   Total Bilirubin 0.3  0.3 - 1.2 mg/dL   GFR calc non Af Amer 65 (*) >90 mL/min   GFR calc Af Amer 76 (*) >90 mL/min  ETHANOL      Result Value Range   Alcohol, Ethyl (B) <11  0 - 11 mg/dL  URINE RAPID DRUG SCREEN (HOSP PERFORMED)      Result Value Range   Opiates NONE DETECTED  NONE DETECTED   Cocaine NONE DETECTED  NONE DETECTED   Benzodiazepines NONE DETECTED  NONE DETECTED   Amphetamines NONE DETECTED  NONE DETECTED   Tetrahydrocannabinol NONE DETECTED  NONE DETECTED   Barbiturates NONE DETECTED  NONE DETECTED   No results found.    MDM   1. Adjustment reaction, other   2. Psychosis     Patient with history of schizoaffective disorder. Here with IVC papers taken on by his wife who believes that patient is psychotic and harm to himself or others. Patient is currently mute and will not talk to me or any nursing staff. Reviewed his records it appears that the mucous part of his psychotic episodes. I have medically cleared him. Spoke with asked who will assess patient.  Filed Vitals:   11/09/12 1428  BP: 147/94  Pulse: 90  Temp:  98.7 F (37.1 C)  TempSrc: Oral  Resp: 20  SpO2: 95%       Adriene Padula A Hilarie Sinha, PA-C 11/09/12 1714

## 2012-11-09 NOTE — ED Notes (Signed)
Notified EDP of pt.'s B/P.  Dr. Lynelle Doctor on phone, Dr. Rubin Payor will relay message to Dr. Lynelle Doctor.  New orders to redo B/P in 30 minutes., Ativan, 1mg  given.

## 2012-11-09 NOTE — Consult Note (Signed)
Reason for Consult: Patient Stanley Watkins due to threat to himself and others Referring Physician: ED MD  Stanley Watkins is an 62 y.o. male.  HPI: Patient was IVC'd by his wife as a threat to himself and others.  However, he will not talk on assessment.  He looked at this assessor when his name was called and started twirling his hair with slight pulls but never answered or spoke.  At the end of asking questions, he sighed and looked bored.  No past medical history on file.  No past surgical history on file.  No family history on file.  Social History:  has no tobacco, alcohol, and drug history on file.  Allergies: Not on File  Medications: I have reviewed the patient's current medications.  Results for orders placed during the hospital encounter of 11/09/12 (from the past 48 hour(s))  CBC     Status: None   Collection Time    11/09/12  3:00 PM      Result Value Range   WBC 6.4  4.0 - 10.5 K/uL   RBC 5.62  4.22 - 5.81 MIL/uL   Hemoglobin 15.5  13.0 - 17.0 g/dL   HCT 47.8  29.5 - 62.1 %   MCV 81.7  78.0 - 100.0 fL   MCH 27.6  26.0 - 34.0 pg   MCHC 33.8  30.0 - 36.0 g/dL   RDW 30.8  65.7 - 84.6 %   Platelets 188  150 - 400 K/uL  COMPREHENSIVE METABOLIC PANEL     Status: Abnormal   Collection Time    11/09/12  3:00 PM      Result Value Range   Sodium 135  135 - 145 mEq/L   Potassium 3.3 (*) 3.5 - 5.1 mEq/L   Chloride 101  96 - 112 mEq/L   CO2 24  19 - 32 mEq/L   Glucose, Bld 109 (*) 70 - 99 mg/dL   BUN 11  6 - 23 mg/dL   Creatinine, Ser 9.62  0.50 - 1.35 mg/dL   Calcium 9.6  8.4 - 95.2 mg/dL   Total Protein 8.0  6.0 - 8.3 g/dL   Albumin 4.1  3.5 - 5.2 g/dL   AST 18  0 - 37 U/L   ALT 19  0 - 53 U/L   Alkaline Phosphatase 71  39 - 117 U/L   Total Bilirubin 0.3  0.3 - 1.2 mg/dL   GFR calc non Af Amer 65 (*) >90 mL/min   GFR calc Af Amer 76 (*) >90 mL/min   Comment: (NOTE)     The eGFR has been calculated using the CKD EPI equation.     This calculation has not been  validated in all clinical situations.     eGFR's persistently <90 mL/min signify possible Chronic Kidney     Disease.  ETHANOL     Status: None   Collection Time    11/09/12  3:00 PM      Result Value Range   Alcohol, Ethyl (B) <11  0 - 11 mg/dL   Comment:            LOWEST DETECTABLE LIMIT FOR     SERUM ALCOHOL IS 11 mg/dL     FOR MEDICAL PURPOSES ONLY  URINE RAPID DRUG SCREEN (HOSP PERFORMED)     Status: None   Collection Time    11/09/12  3:01 PM      Result Value Range   Opiates NONE DETECTED  NONE DETECTED  Cocaine NONE DETECTED  NONE DETECTED   Benzodiazepines NONE DETECTED  NONE DETECTED   Amphetamines NONE DETECTED  NONE DETECTED   Tetrahydrocannabinol NONE DETECTED  NONE DETECTED   Barbiturates NONE DETECTED  NONE DETECTED   Comment:            DRUG SCREEN FOR MEDICAL PURPOSES     ONLY.  IF CONFIRMATION IS NEEDED     FOR ANY PURPOSE, NOTIFY LAB     WITHIN 5 DAYS.                LOWEST DETECTABLE LIMITS     FOR URINE DRUG SCREEN     Drug Class       Cutoff (ng/mL)     Amphetamine      1000     Barbiturate      200     Benzodiazepine   200     Tricyclics       300     Opiates          300     Cocaine          300     THC              50    No results found.  ROS Blood pressure 147/94, pulse 90, temperature 98.7 F (37.1 C), temperature source Oral, resp. rate 20, SpO2 95.00%. Physical Exam:  Completed in the ED, reviewed, concur with findings  Assessment/Plan: 1.  Monitor for 24 hours and try to get the patient to talk.  Nanine Means, PMH-NP 11/09/2012, 4:03 PM

## 2012-11-09 NOTE — ED Notes (Signed)
Medical necessity form taken to physician office to be filled out by Dr. Devoria Albe.

## 2012-11-09 NOTE — ED Notes (Signed)
Pt will not answer questions about hx or allergies

## 2012-11-09 NOTE — ED Notes (Signed)
The B/P at 1811 was manual.

## 2012-11-09 NOTE — ED Notes (Signed)
Notified EDP of pt.'s B/P.  Ordered pharm rec to be done asap, redo B/P at 2000, and notify EDP.

## 2012-11-09 NOTE — ED Notes (Signed)
Pt. Ate 2 small pkg.s of crackers and some applesauce.  Drank a few sips of water.

## 2012-11-09 NOTE — ED Provider Notes (Signed)
She brought to emergency department for IVC papers by his wife. He has a history of psychosis and he has episodes where he becomes almost catatonic. Patient is lying in bed and appears to be watching TV. He does not make eye contact. He is nonverbal. However the nurse does report he did feed himself and ate some applesauce and drink on his own.  Medical screening examination/treatment/procedure(s) were conducted as a shared visit with non-physician practitioner(s) and myself.  I personally evaluated the patient during the encounter  Devoria Albe, MD, Franz Dell, MD 11/09/12 [id number]

## 2012-11-09 NOTE — ED Notes (Signed)
Per patient wife [name] patient had written notes stating that he was ready to end everything. Wife also patient reports that patient has been making statements that people was following him and tapping his cell phone. Wife also states that patient has been non-verbal since February and out of work due to this since February. Wife reports that patient will not follow up with his doctors or take his medications. Wife reports that patient use to take risperdal constant injection but he also stop that too.

## 2012-11-10 ENCOUNTER — Encounter (HOSPITAL_COMMUNITY): Payer: Self-pay | Admitting: Emergency Medicine

## 2012-11-10 DIAGNOSIS — F29 Unspecified psychosis not due to a substance or known physiological condition: Secondary | ICD-10-CM

## 2012-11-10 MED ORDER — RISPERIDONE 1 MG PO TBDP
1.0000 mg | ORAL_TABLET | Freq: Two times a day (BID) | ORAL | Status: DC
  Filled 2012-11-10 (×4): qty 1

## 2012-11-10 MED ORDER — ZIPRASIDONE MESYLATE 20 MG IM SOLR
20.0000 mg | Freq: Once | INTRAMUSCULAR | Status: AC
  Administered 2012-11-10: 20 mg via INTRAMUSCULAR
  Filled 2012-11-10: qty 20

## 2012-11-10 MED ORDER — SERTRALINE HCL 50 MG PO TABS
25.0000 mg | ORAL_TABLET | Freq: Every day | ORAL | Status: DC
  Filled 2012-11-10: qty 1

## 2012-11-10 MED ORDER — RISPERIDONE 2 MG PO TBDP
2.0000 mg | ORAL_TABLET | Freq: Every day | ORAL | Status: DC
  Filled 2012-11-10 (×2): qty 1

## 2012-11-10 NOTE — BH Assessment (Signed)
Received a telephone call from [facility] in intake at [facility]. She reports that pt's information is currently under review and she will contact TTS once pt is reviewed by their doctor.   Glorious Peach, MS, LCASA Assessment Counselor

## 2012-11-10 NOTE — ED Notes (Signed)
Attempted to give pt PO medication.  Pt would not take meds.

## 2012-11-10 NOTE — Progress Notes (Signed)
[facility] provided pt with a [employer] Card application.  °

## 2012-11-10 NOTE — ED Notes (Signed)
[employer] and security called to bedside for medication administration.

## 2012-11-10 NOTE — ED Notes (Signed)
Up to bathroom x2 today. Ate some grits and eggs and drank the OJ for breakfast tray. Unsure of lunch tray-did not see it.

## 2012-11-10 NOTE — ED Notes (Signed)
Pt refuses to speak with staff.  Pt refuses to cooperate.  2 staff members attempted to get pt's BP.  Pt refused.

## 2012-11-10 NOTE — BH Assessment (Signed)
Assessment Note  Stanley Watkins is a 62 y.o. male who presents to [facility] with psychosis.  The following assessment is has been completed with collateral information gathered from pt.'s spouse, nurse and IVC petition.  Pt is under currently under a psychiatrist care--Dr. Tomasa Rand with [facility] and has stopped taking his mediations since approx March 2014 and stopped verbally communicating since February 2014; refuses to attend scheduled psychiatric appointments. Pt has been communicating by leaving notes round the home the home.  Pt has not spoken to medical staff since arriving to [facility].  Pt.'s spouse reports poor hygiene, eating/sleeping habits and AVH/Delusional thought process--thinks that the mole on his neck has been put there by the devil and he's being controlled by that mole; thinks he being spied on by the government by the use of his mobile phone and people are following him.  Pt wrote a note to his wife asking her not to send him to hell because she controls his mind.  Pt is employed with the [employer] and has been out of work for several months since he stopped communicating.  Pt has hx of schizoaffective disorder.    Axis I: Psychotic Disorder NOS Axis II: Deferred Axis III:  Past Medical History  Diagnosis Date  . Diabetes mellitus without complication     pre diabetes  . Schizoaffective disorder   . Depression, major   . Hypertension     non complaint   Axis IV: other psychosocial or environmental problems, problems related to social environment and problems with access to health care services Axis V: 11-20 some danger of hurting self or others possible OR occasionally fails to maintain minimal personal hygiene OR gross impairment in communication  Past Medical History:  Past Medical History  Diagnosis Date  . Diabetes mellitus without complication     pre diabetes  . Schizoaffective disorder   . Depression, major   . Hypertension     non complaint     History reviewed. No pertinent past surgical history.  Family History:  Family History  Problem Relation Age of Onset  . Diabetes Mother   . Cancer Father   . Schizophrenia Maternal Aunt     Social History:  reports that he has never smoked. He has never used smokeless tobacco. He reports that he does not drink alcohol. His drug history is not on file.  Additional Social History:  Alcohol / Drug Use Pain Medications: See MAR  Prescriptions: See MAR  Over the Counter: See MAR  History of alcohol / drug use?: No history of alcohol / drug abuse Longest period of sobriety (when/how long): Unable to assess  CIWA: CIWA-Ar BP: 131/87 mmHg Pulse Rate: 85 COWS:    Allergies: No Known Allergies  Home Medications:  (Not in a hospital admission)  OB/GYN Status:  No LMP for male patient.  General Assessment Data [facility] of Assessment: [facility] Is this a Tele or Face-to-Face Assessment?: Tele Assessment Is this an Initial Assessment or a Re-assessment for this encounter?: Initial Assessment Living Arrangements: Spouse/significant other Can pt return to current living arrangement?: Yes Admission Status: Involuntary Is patient capable of signing voluntary admission?: No Transfer from: Acute Hospital Referral Source: MD  Medical Screening Exam [facility] ONLY) Medical Exam completed: No Reason for MSE not completed: Other: (None )  [facility] Crisis Care Plan Living Arrangements: Spouse/significant other Name of Psychiatrist: Dr. Syble Creek  Name of Therapist: Unk   Education Status Is patient currently in school?: No Current Grade:  None  Highest grade of school patient has completed: None  Name of school: None  Contact person: None   Risk to self Suicidal Ideation: No Suicidal Intent: No Is patient at risk for suicide?: No Suicidal Plan?: No Access to Means: No What has been your use of drugs/alcohol within the last 12 months?: None  Previous  Attempts/Gestures: No How many times?: 0 Other Self Harm Risks: None  Triggers for Past Attempts: None known Intentional Self Injurious Behavior: None Family Suicide History: No Recent stressful life event(s): Other (Comment) (has not verbally communicating since February, writes notes ) Persecutory voices/beliefs?: No Depression: No Depression Symptoms:  (None reported ) Substance abuse history and/or treatment for substance abuse?: No Suicide prevention information given to non-admitted patients: Not applicable  Risk to Others Homicidal Ideation: No Thoughts of Harm to Others: No Current Homicidal Intent: No Current Homicidal Plan: No Access to Homicidal Means: No Identified Victim: None  History of harm to others?: No Assessment of Violence: None Noted Violent Behavior Description: None  Does patient have access to weapons?: No Criminal Charges Pending?: No Does patient have a court date: No  Psychosis Hallucinations: Visual;Auditory (Unk if with command ) Delusions: Unspecified;Persecutory (Paranoid )  Mental Status Report Appear/Hygiene: Disheveled Eye Contact: Poor (No eye contact at all ) Motor Activity: Unremarkable Speech: Unable to assess Level of Consciousness: Alert Mood: Apprehensive;Preoccupied Affect: Unable to Assess Anxiety Level: None Thought Processes:  (UTA ) Judgement: Impaired Orientation: Unable to assess Obsessive Compulsive Thoughts/Behaviors: None  Cognitive Functioning Concentration: Normal Memory:  (Unable to assess ) IQ: Average Insight: Poor Impulse Control: Poor Appetite: Fair Weight Loss: 0 Weight Gain: 0 Sleep: No Change Total Hours of Sleep:  (Unable to assess ) Vegetative Symptoms: None  ADLScreening [facility]) Patient's cognitive ability adequate to safely complete daily activities?: Yes Patient able to express need for assistance with ADLs?: Yes Independently performs ADLs?: Yes (appropriate for  developmental age)  Prior Inpatient Therapy Prior Inpatient Therapy: No (Unk ) Prior Therapy Dates: Unk  Prior Therapy Facilty/Provider(s): Unk  Reason for Treatment: Unk   Prior Outpatient Therapy Prior Outpatient Therapy: Yes Prior Therapy Dates: Current  Prior Therapy Facilty/Provider(s): Dr.Cunningham--[facility]  Reason for Treatment: Med Mgt   ADL Screening (condition at time of admission) Patient's cognitive ability adequate to safely complete daily activities?: Yes Is the patient deaf or have difficulty hearing?: No Does the patient have difficulty seeing, even when wearing glasses/contacts?: No Does the patient have difficulty concentrating, remembering, or making decisions?: No Patient able to express need for assistance with ADLs?: Yes Does the patient have difficulty dressing or bathing?: No Independently performs ADLs?: Yes (appropriate for developmental age) Does the patient have difficulty walking or climbing stairs?: No Weakness of Legs: None Weakness of Arms/Hands: None  Home Assistive Devices/Equipment Home Assistive Devices/Equipment: None  Therapy Consults (therapy consults require a physician order) PT Evaluation Needed: No OT Evalulation Needed: No SLP Evaluation Needed: No Abuse/Neglect Assessment (Assessment to be complete while patient is alone) Physical Abuse: Denies Verbal Abuse: Denies Sexual Abuse: Denies Exploitation of patient/patient's resources: Denies Self-Neglect: Denies Values / Beliefs Cultural Requests During Hospitalization: None Spiritual Requests During Hospitalization: None Consults Spiritual Care Consult Needed: No Social Work Consult Needed: No Merchant navy officer (For Healthcare) Advance Directive: Patient does not have advance directive;Patient would not like information Pre-existing out of facility DNR order (yellow form or pink MOST form): No Nutrition Screen- [facility] Patient's home diet: Other (Comment) (Unable to  assess)  Additional Information  1:1 In Past 12 Months?: No CIRT Risk: No Elopement Risk: No Does patient have medical clearance?: Yes     Disposition:  Disposition Initial Assessment Completed for this Encounter: Yes Disposition of Patient: Inpatient treatment program;Referred to (Pt accepted by Donell Sievert, PA pending 400 hall bed ) Type of inpatient treatment program: Adult Patient referred to: Other (Comment) (Pt accepted by Donell Sievert, PA pending 400 hall bed )  On Site Evaluation by:   Reviewed with Physician:    Murrell Redden 11/10/2012 6:35 AM

## 2012-11-10 NOTE — ED Notes (Signed)
Tele- assessment in progress with Terri, Counselor.

## 2012-11-10 NOTE — BH Assessment (Signed)
This Clinical research associate notified by Thurman Coyer, [facility] in assessment that pt has been accepted to [facility]. Per Catha Gosselin LCSW pt was accepted by Donell Sievert and Dr. Lolly Mustache. Pt is assigned to bed 405-1. Pt's admission checklist and Consent to Release, and IVC paperwork faxed to [facility].   Glorious Peach, MS, LCASA Assessment Counselor

## 2012-11-10 NOTE — Progress Notes (Signed)
CSW called [facility] again.  Still unable to get Thayer Ohm on the phone.  CSW spoke with Dahlia Byes, NP who confirmed that she had accepted patient to [facility].  She stated that room assignment and other information would have to be confirmed with Thayer Ohm who had been very busy and not available.  CSW confirmed that it would be ok to have day time CSW follow up in the am and coordinate transport, etc.  .Antionette Fairy. Antiono Ettinger, LCSWA  [phone]  .11/10/2012  11;15 pm

## 2012-11-10 NOTE — Progress Notes (Addendum)
Call from [place] at [facility].  Pt has been accepted. CSW discussed with Thayer Ohm that I thought pt had been accepted to another facility but that I would confirm before turning down placement.  Thayer Ohm reported that he would be there until 11:30.   CSW called to [place] @ [facility] concerning pts pending acceptance to [facility]  Per Minerva Areola, if [facility] will accept pt, then send pt to [facility] as [facility] only has one opening currently and that bed could be given to another hard to place patient that was being reviewed so that both pts could receive treatment and placement.   CSW attempted to call [facility] back and left a message for [place].  Marva Panda, Theresia Majors  [phone]  .11/10/2012 8:45 pm   CSW called Thayer Ohm at [facility] again and left another message concerning pts acceptance to facility.  CSW will call again prior to shift change @ 11:30 pm.  Marva Panda, LCSWA  [phone] .11/10/2012 10:30 pm

## 2012-11-10 NOTE — Consult Note (Signed)
Patient did not speak except to say no when asked if he slept.  He follows directions appropriately.  Flat affect and forwards no information.  He is restarted on Risperdal 1 mg BID and 2 mg at bedtime with Zoloft 25 mg daily from his last discharge from [facility].  Recommend placement in an inpatient hospital for stabilization.  I have personally seen the patient and agreed with the findings and involved in the treatment plan. Kathryne Sharper, MD

## 2012-11-11 ENCOUNTER — Inpatient Hospital Stay (HOSPITAL_COMMUNITY)
Admission: AD | Admit: 2012-11-11 | Discharge: 2012-11-23 | DRG: 885 | Disposition: A | Discharge status: Home / Self Care | Payer: Federal, State, Local not specified - Other | Source: Intra-hospital | Attending: Psychiatry | Admitting: Psychiatry

## 2012-11-11 DIAGNOSIS — E119 Type 2 diabetes mellitus without complications: Secondary | ICD-10-CM | POA: Diagnosis present

## 2012-11-11 DIAGNOSIS — F329 Major depressive disorder, single episode, unspecified: Secondary | ICD-10-CM | POA: Diagnosis present

## 2012-11-11 DIAGNOSIS — Z91199 Patient's noncompliance with other medical treatment and regimen due to unspecified reason: Secondary | ICD-10-CM

## 2012-11-11 DIAGNOSIS — Z9119 Patient's noncompliance with other medical treatment and regimen: Secondary | ICD-10-CM

## 2012-11-11 DIAGNOSIS — F259 Schizoaffective disorder, unspecified: Principal | ICD-10-CM | POA: Diagnosis present

## 2012-11-11 DIAGNOSIS — I10 Essential (primary) hypertension: Secondary | ICD-10-CM | POA: Diagnosis present

## 2012-11-11 MED ORDER — ACETAMINOPHEN 325 MG PO TABS: 650.0000 mg | ORAL_TABLET | Freq: Four times a day (QID) | ORAL | Status: DC | PRN

## 2012-11-11 MED ORDER — FLUPHENAZINE HCL 5 MG PO TABS
5.0000 mg | ORAL_TABLET | Freq: Every day | ORAL | Status: DC
  Administered 2012-11-12 – 2012-11-13 (×2): 5 mg via ORAL
  Filled 2012-11-11 (×4): qty 1

## 2012-11-11 MED ORDER — LORAZEPAM 2 MG/ML IJ SOLN
1.0000 mg | Freq: Two times a day (BID) | INTRAMUSCULAR | Status: DC
  Administered 2012-11-11: 1 mg via INTRAMUSCULAR
  Filled 2012-11-11: qty 1

## 2012-11-11 MED ORDER — BENZTROPINE MESYLATE 1 MG PO TABS
1.0000 mg | ORAL_TABLET | Freq: Every day | ORAL | Status: DC
  Administered 2012-11-12 – 2012-11-22 (×9): 1 mg via ORAL
  Filled 2012-11-11 (×7): qty 1
  Filled 2012-11-11: qty 14
  Filled 2012-11-11 (×6): qty 1

## 2012-11-11 MED ORDER — SERTRALINE HCL 50 MG PO TABS
50.0000 mg | ORAL_TABLET | Freq: Every day | ORAL | Status: DC
  Administered 2012-11-12 – 2012-11-14 (×3): 50 mg via ORAL
  Filled 2012-11-11 (×5): qty 1

## 2012-11-11 MED ORDER — LORAZEPAM 1 MG PO TABS: 1.0000 mg | ORAL_TABLET | Freq: Two times a day (BID) | ORAL | Status: DC

## 2012-11-11 MED ORDER — ALUM & MAG HYDROXIDE-SIMETH 200-200-20 MG/5ML PO SUSP: 30.0000 mL | ORAL | Status: DC | PRN

## 2012-11-11 MED ORDER — LISINOPRIL 5 MG PO TABS
5.0000 mg | ORAL_TABLET | Freq: Every day | ORAL | Status: DC
  Administered 2012-11-12 – 2012-11-22 (×10): 5 mg via ORAL
  Filled 2012-11-11 (×12): qty 1
  Filled 2012-11-11: qty 14
  Filled 2012-11-11 (×3): qty 1

## 2012-11-11 MED ORDER — LORAZEPAM 1 MG PO TABS
1.0000 mg | ORAL_TABLET | Freq: Two times a day (BID) | ORAL | Status: DC
  Administered 2012-11-12 – 2012-11-23 (×20): 1 mg via ORAL
  Filled 2012-11-11 (×23): qty 1

## 2012-11-11 MED ORDER — TRAZODONE HCL 50 MG PO TABS
50.0000 mg | ORAL_TABLET | Freq: Every evening | ORAL | Status: DC | PRN
  Administered 2012-11-12 – 2012-11-17 (×6): 50 mg via ORAL
  Filled 2012-11-11 (×16): qty 1

## 2012-11-11 MED ORDER — POTASSIUM CHLORIDE CRYS ER 20 MEQ PO TBCR
20.0000 meq | EXTENDED_RELEASE_TABLET | Freq: Two times a day (BID) | ORAL | Status: AC
  Administered 2012-11-12 – 2012-11-13 (×3): 20 meq via ORAL
  Filled 2012-11-11 (×4): qty 1

## 2012-11-11 MED ORDER — MAGNESIUM HYDROXIDE 400 MG/5ML PO SUSP: 30.0000 mL | Freq: Every day | ORAL | Status: DC | PRN

## 2012-11-11 NOTE — BHH Group Notes (Signed)
[facility] Group Notes:  (Counselor/Nursing/MHT/Case Management/Adjunct)  11/11/2012 1:15PM  Type of Therapy:  Group Therapy  Participation Level:  Did not attend    Summary of Progress/Problems: The topic for group was balance in life.  Pt participated in the discussion about when their life was in balance and out of balance and how this feels.  Pt discussed ways to get back in balance and short term goals they can work on to get where they want to be.    Daryel Gerald B 11/11/2012 2:10 PM

## 2012-11-11 NOTE — ED Notes (Signed)
Report called to Louie Bun, RN at [facility].
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
## 2012-11-11 NOTE — ED Notes (Signed)
Sheriff arrived to transport pt to [facility].

## 2012-11-11 NOTE — [facility] Suicide Risk Assessment (Signed)
Suicide Risk Assessment  Admission Assessment     Nursing information obtained from:    Demographic factors:    Current Mental Status:    Loss Factors:    Historical Factors:    Risk Reduction Factors:     CLINICAL FACTORS:   Depression:   Anhedonia Delusional Hopelessness Insomnia Currently Psychotic Previous Psychiatric Diagnoses and Treatments  COGNITIVE FEATURES THAT CONTRIBUTE TO RISK:  Closed-mindedness Polarized thinking    SUICIDE RISK:   Minimal: No identifiable suicidal ideation.  Patients presenting with no risk factors but with morbid ruminations; may be classified as minimal risk based on the severity of the depressive symptoms  PLAN OF CARE:1. Admit for crisis management and stabilization. 2. Medication management to reduce current symptoms to base line and improve the     patient's overall level of functioning 3. Treat health problems as indicated. 4. Develop treatment plan to decrease risk of relapse upon discharge and the need for     readmission. 5. Psycho-social education regarding relapse prevention and self care. 6. Health care follow up as needed for medical problems. 7. Restart home medications where appropriate.   I certify that inpatient services furnished can reasonably be expected to improve the patient's condition.  Thedore Mins, MD 11/11/2012, 10:30 AM

## 2012-11-11 NOTE — Consult Note (Signed)
Note reviewed and agreed with  

## 2012-11-11 NOTE — ED Provider Notes (Signed)
3:45 AM patient accepted to [facility]. Will discharge and transfer.  Audree Camel, MD 11/11/12 [phone]

## 2012-11-11 NOTE — ED Notes (Signed)
Patient refused vitals rn notified

## 2012-11-11 NOTE — Progress Notes (Signed)
Patient ID: [name], male   DOB: Mar 08, 1951, 62 y.o.   MRN: [id number] D: Patient continues to not respond to staff.  He has poor eye contact and does not answer questions.  Patient was asked to just nod his head and patient would not respond.  Patient observed eating breakfast this morning.  When doctor attempted to assess patient, he laid in bed nonresponsive. Patient arrived on unit early this morning with minimal sleep.  Assessment can not be completed by nursing staff.  SI/HI/AVH cannot be assessed. A: Continue to observe patient and redirect as necessary.  Safety checks completed every 15 minutes per protocol. R: Patient is not receptive to staff.

## 2012-11-11 NOTE — Progress Notes (Signed)
Patient ID: Stanley Watkins, male   DOB: December 16, 1950, 62 y.o.   MRN: [id number]   Pt was had a flat affect during the adm process. Wouldn't answer questions, sign documents, however pt did follow most instructions. Pt wouldn't take off his scrubs but he did lift up his shirt and pull down his pants to allow writer to do a skin assessment. Writer didn't observe any A/V during assessment. Continue to monitor 15 min checks.

## 2012-11-11 NOTE — Tx Team (Signed)
  Interdisciplinary Treatment Plan Update   Date Reviewed:  11/11/2012  Time Reviewed:  8:07 AM  Progress in Treatment:   Attending groups: Yes Participating in groups: Yes Taking medication as prescribed: Yes  Tolerating medication: Yes Family/Significant other contact made: No Patient understands diagnosis: No  Limited insight currently  Discussing patient identified problems/goals with staff: Yes  See initial plan Medical problems stabilized or resolved: Yes Denies suicidal/homicidal ideation: No  Not responding to questions Patient has not harmed self or others: Yes  For review of initial/current patient goals, please see plan of care.  Estimated Length of Stay:  4-5 days  Reason for Continuation of Hospitalization: Delusions  Depression Hallucinations Medication stabilization Other; describe Catatonia  New Problems/Goals identified:  N/A  Discharge Plan or Barriers:   return home, follow up outpt  Additional Comments:  Stanley Watkins is a 63 y.o. male who presents to [facility] with psychosis. The following assessment is has been completed with collateral information gathered from pt.'s spouse, nurse and IVC petition. Pt is under currently under a psychiatrist care--Dr. Tomasa Rand with [facility] and has stopped taking his mediations since approx March 2014 and stopped verbally communicating since February 2014; refuses to attend scheduled psychiatric appointments. Pt has been communicating by leaving notes round the home the home. Pt has not spoken to medical staff since arriving to [facility]. Pt.'s spouse reports poor hygiene, eating/sleeping habits and AVH/Delusional thought process--thinks that the mole on his neck has been put there by the devil and he's being controlled by that mole; thinks he being spied on by the government by the use of his mobile phone and people are following him. Pt wrote a note to his wife asking her not to send him to hell because she controls his  mind. Pt is employed with the [employer] and has been out of work for several months since he stopped communicating. Pt has hx of schizoaffective disorder. Pt in bed, non communicative today.   Attendees:  Signature: Thedore Mins, MD 11/11/2012 8:07 AM   Signature: Richelle Ito, LCSW 11/11/2012 8:07 AM  Signature: Fransisca Kaufmann, NP 11/11/2012 8:07 AM  Signature: Joslyn Devon, RN 11/11/2012 8:07 AM  Signature: Nestor Ramp, RN 11/11/2012 8:07 AM  Signature:  11/11/2012 8:07 AM  Signature:   11/11/2012 8:07 AM  Signature:    Signature:    Signature:    Signature:    Signature:    Signature:      Scribe for Treatment Team:   Richelle Ito, LCSW  11/11/2012 8:07 AM

## 2012-11-11 NOTE — ED Notes (Addendum)
Pt allowed staff to take his vitals with minimal resistance.  Pt refused to open his mouth for temperature to be taken.

## 2012-11-12 LAB — GLUCOSE, CAPILLARY

## 2012-11-12 NOTE — Progress Notes (Addendum)
Patient ID: Stanley Watkins, male   DOB: 1951-02-05, 62 y.o.   MRN: [id number] 11-12-12 @ 1412 nursing shift note: D: unable to complete this admission due to the patient remaining selectively mute. rn was able to administer him his am medications. Lab was unable to do a blood draw for a HbA1C.  It was rescheduled for 1930 on 11-12-12. A: attempted to encourage and engage this pt in conversation. R: he remains mute. He has not voiced any complaints or needs. rn will monitor and q 15 min cks continue.

## 2012-11-12 NOTE — Progress Notes (Signed)
Patient refused CBG monitoring.  2 staff members tried and he pulled away.  Communication to provider.

## 2012-11-12 NOTE — Progress Notes (Signed)
Patient took po meds with much prompting.  Fluids left at bedside.  Lunch tryt 75% eaten.  Continues to be mute.

## 2012-11-12 NOTE — Progress Notes (Signed)
Adult Psychoeducational Group Note  Date:  11/12/2012 Time:  11:28 AM  Group Topic/Focus:  Early Warning Signs:   The focus of this group is to help patients identify signs or symptoms they exhibit before slipping into an unhealthy state or crisis.  Participation Level:  Did Not Attend  Cathlean Cower 11/12/2012, 11:28 AM

## 2012-11-12 NOTE — [facility] Group Notes (Signed)
Adult Psychoeducational Group Note  Date:  11/12/2012 Time:  9:17 PM  Group Topic/Focus:  Wrap-Up Group:   The focus of this group is to help patients review their daily goal of treatment and discuss progress on daily workbooks.  Participation Level:  Did Not Attend  Participation Quality:  Did not attend  Affect:  None  Cognitive:  None  Insight: None  Engagement in Group:  None  Modes of Intervention:  Discussion  Additional Comments:  [place] did not attend group.  Caroll Rancher A 11/12/2012, 9:17 PM

## 2012-11-12 NOTE — [facility] Group Notes (Signed)
[facility] LCSW Aftercare Discharge Planning Group Note   11/12/2012 8:23 AM  Participation Quality:  Did not attend.  Found him in bed, awake prior to group.  After I introduced self, he asked if he could leave.  I explained assessment process to him.  Asked for permission to call wife.  He was not verbally responsive to this.   Daryel Gerald B

## 2012-11-12 NOTE — Progress Notes (Addendum)
[facility] MD Progress Note  11/12/2012 11:13 AM Stanley Watkins  MRN:  [id number] Subjective:  Patient does not verbally respond to any questions asked by writer this morning. Objective: MHT staff report that patient only spoke this morning to ask for something to eat. An empty breakfast tray was observed by his bed. Patient observed staring at the ceiling and playing with his hair. He was observed moving his legs in the bed. The patient is more alert than yesterday. He received an IM dose of ativan yesterday and began taking oral medications this morning with encouragement from nursing staff. Patient is observed to be in no acute distress physically.   Diagnosis:   DSM5: Axis I: Schizoaffective Disorder Axis II: Deferred Axis III:  Past Medical History  Diagnosis Date  . Diabetes mellitus without complication     pre diabetes  . Schizoaffective disorder   . Depression, major   . Hypertension     non complaint   Axis IV: occupational problems, other psychosocial or environmental problems and problems with primary support group Axis V: 41-50 serious symptoms  ADL's:  Impaired  Sleep: Good  Appetite:  Fair  Suicidal Ideation:  Patient would not answer Homicidal Ideation:  Patient would not answer AEB (as evidenced by):  Psychiatric Specialty Exam: Review of Systems  Unable to perform ROS: acuity of condition    Blood pressure 125/85, pulse 96, temperature 98 F (36.7 C), temperature source Oral, resp. rate 18, height 5' 8.9" (1.75 m), weight 105.235 kg (232 lb).Body mass index is 34.36 kg/(m^2).  General Appearance: Disheveled  Eye Contact::  Poor  Speech:  Unable to assess  Volume:  Unable to assess  Mood:  Depressed  Affect:  Flat  Thought Process:  Unable to assess  Orientation:  Other:  Unable to assess   Thought Content:  Unable to assess  Suicidal Thoughts:  Patient would not respond to Clinical research associate  Homicidal Thoughts:  Patient would not respond to Clinical research associate  Memory:   Unable to assess  Judgement:  Poor  Insight:  Lacking  Psychomotor Activity:  Decreased  Concentration:  Poor  Recall:  Unable to assess  Akathisia:  No  Handed:  Right  AIMS (if indicated):     Assets:  Intimacy Leisure Time Physical Health Resilience  Sleep:  Number of Hours: 6.75   Current Medications: Current Facility-Administered Medications  Medication Dose Route Frequency Provider Last Rate Last Dose  . acetaminophen (TYLENOL) tablet 650 mg  650 mg Oral Q6H PRN Kerry Hough, PA-C      . alum & mag hydroxide-simeth (MAALOX/MYLANTA) 200-200-20 MG/5ML suspension 30 mL  30 mL Oral Q4H PRN Kerry Hough, PA-C      . benztropine (COGENTIN) tablet 1 mg  1 mg Oral QHS Mojeed Akintayo      . fluPHENAZine (PROLIXIN) tablet 5 mg  5 mg Oral QHS Mojeed Akintayo      . lisinopril (PRINIVIL,ZESTRIL) tablet 5 mg  5 mg Oral Daily Fransisca Kaufmann, NP   5 mg at 11/12/12 0826  . LORazepam (ATIVAN) tablet 1 mg  1 mg Oral BID Fransisca Kaufmann, NP   1 mg at 11/12/12 4098   Or  . LORazepam (ATIVAN) injection 1 mg  1 mg Intramuscular BID Fransisca Kaufmann, NP   1 mg at 11/11/12 1701  . magnesium hydroxide (MILK OF MAGNESIA) suspension 30 mL  30 mL Oral Daily PRN Kerry Hough, PA-C      . potassium chloride SA (K-DUR,KLOR-CON) CR tablet  20 mEq  20 mEq Oral BID Fransisca Kaufmann, NP   20 mEq at 11/12/12 0826  . sertraline (ZOLOFT) tablet 50 mg  50 mg Oral Daily Mojeed Akintayo   50 mg at 11/12/12 0826  . traZODone (DESYREL) tablet 50 mg  50 mg Oral QHS,MR X 1 Kerry Hough, PA-C        Lab Results:  Results for orders placed during the hospital encounter of 11/11/12 (from the past 48 hour(s))  GLUCOSE, CAPILLARY     Status: None   Collection Time    11/11/12  6:10 AM      Result Value Range   Glucose-Capillary 98  70 - 99 mg/dL  GLUCOSE, CAPILLARY     Status: None   Collection Time    11/12/12  6:12 AM      Result Value Range   Glucose-Capillary 92  70 - 99 mg/dL   Comment 1 Notify RN     Comment 2  Documented in Chart      Physical Findings: AIMS: Facial and Oral Movements Muscles of Facial Expression: None, normal Lips and Perioral Area: None, normal Jaw: None, normal Tongue: None, normal,Extremity Movements Upper (arms, wrists, hands, fingers): None, normal Lower (legs, knees, ankles, toes): None, normal, Trunk Movements Neck, shoulders, hips: None, normal, Overall Severity Severity of abnormal movements (highest score from questions above): None, normal Incapacitation due to abnormal movements: None, normal Patient's awareness of abnormal movements (rate only patient's report): No Awareness,    CIWA:    COWS:     Treatment Plan Summary: Daily contact with patient to assess and evaluate symptoms and progress in treatment Medication management  Plan: Continue crisis management and stabilization.  Medication management: Continue Cogentin 1 mg at hs for EPS prevention. Continue Prolixin 5 mg hs for psychosis. Continue Zoloft 50 mg daily for depressive symptoms.  Encouraged patient to attend groups and participate in group counseling sessions and activities.  Discharge plan in progress.  Continue current treatment plan.  Address health issues: Vitals improved since being started on Lisinopril 5 mg daily. Replacing low potassium level with K-DUR.  Medical Decision Making Problem Points:  Established problem, stable/improving (1) and Review of psycho-social stressors (1) Data Points:  Review of medication regiment & side effects (2) Review of new medications or change in dosage (2)  I certify that inpatient services furnished can reasonably be expected to improve the patient's condition.   Gelisa Tieken NP-C 11/12/2012, 11:13 AM

## 2012-11-12 NOTE — Progress Notes (Signed)
Recreation Therapy Notes  Date: 09.05.2014  Time: 9:30pm  Location: [address] Dayroom   Group Topic: Coping Skills   Goal Area(s) Addresses:  Patient will identify things they are grateful for.  Patient will identify how being grateful can influence decision making.   Behavioral Response: Did not attend    Jearl Klinefelter, LRT/CTRS  Jearl Klinefelter 11/12/2012 2:23 PM

## 2012-11-12 NOTE — Progress Notes (Signed)
Patient ID: Stanley Watkins, male   DOB: 12/14/1950, 62 y.o.   MRN: [id number]  D: Pt continues with elective mutism. Pt did take medication after coaxing.   A:  Pt was given scheduled medications.  Q 15 minute checks were done for safety.   R:. Pt is taking medication.  safety maintained on unit.

## 2012-11-12 NOTE — [facility] Group Notes (Signed)
[facility] LCSW Group Therapy  11/12/2012  1:05 PM  Type of Therapy:  Group therapy  Participation Level:  In bed, declined to attend    Summary of Progress/Problems:  Chaplain was here to lead a group on themes of hope and courage.  Daryel Gerald B 11/12/2012 1:39 PM

## 2012-11-12 NOTE — Clinical Social Work Note (Signed)
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

## 2012-11-12 NOTE — Progress Notes (Addendum)
D: Pt in bed resting with eyes closed. Respirations even and unlabored. Pt appears to be in no signs of distress at this time. A: Q57min checks remains for this pt. R: Pt remains safe at this time.  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 Pt did not get up for labs. Pt did respond with leg movement during the verbal encouragement for him to ambulate to lab. Pt did not open his eyes during this period.

## 2012-11-13 LAB — HEMOGLOBIN A1C: Hgb A1c MFr Bld: 6.2 % — ABNORMAL HIGH (ref <5.7)

## 2012-11-13 NOTE — [facility] Group Notes (Signed)
[facility] Group Notes:  (Nursing/MHT/Case Management/Adjunct)  Date:  11/13/2012  Time:  11:20 AM  Type of Therapy:  Nurse Education  Participation Level:  Did Not Attend  Participation Quality:    Affect:   Cognitive:    Insight:    Engagement in Group:    Modes of Intervention:   Summary of Progress/Problems:  Stanley Watkins 11/13/2012, 11:20 AM

## 2012-11-13 NOTE — Progress Notes (Signed)
D-  Patients presents with flat affect and depressed  mood, continues to present with non verbal  Isolative behavior staying in bed. Encouraged pt to take medications pt was able to do so and   by extending hand  And took meds .   A- Support and Encouragement provided, Encouraged patient to ventilate during 1:1.however continues mutism  R- Will continue to monitor on q 15 minute checks for safety, compliant with medications .Pt did ask Clinical research associate for a soda but when Clinical research associate ask pt to take shower he did not respond will continue to encourage.

## 2012-11-13 NOTE — Progress Notes (Signed)
Staff was in the middle of doing a 15 minute check when [name] walked out into the hallway. He stopped me and asked me what kind of hospital is this, and I explained to him where he is. He then asked what he would have to do so that he can leave. Staff told pt that he would have to start talking, taking his medications, and attending groups. Pt was smiling inappropriately throughout the conversation.

## 2012-11-13 NOTE — [facility] Group Notes (Signed)
[facility] Group Notes: (Clinical Social Work)   11/13/2012      Type of Therapy:  Group Therapy   Participation Level:  Did Not Attend    Ambrose Mantle, LCSW 11/13/2012, 12:56 PM

## 2012-11-13 NOTE — Progress Notes (Signed)
Patient ID: Stanley Watkins, male   DOB: [birth year], 62 y.o.   MRN: [id number] [facility] MD Progress Note  11/13/2012 3:06 PM Stanley Watkins  MRN:  [id number] Subjective:  Patient still not responds and keeps looking outside the window. Objective: Looking at ceiling. Not wanted to talk, guarded but not agitated. Still patient is observed to be in no acute distress physically. May be responding to internal stimuli. Diagnosis:   DSM5: Axis I: Schizoaffective Disorder Axis II: Deferred Axis III:  Past Medical History  Diagnosis Date  . Diabetes mellitus without complication     pre diabetes  . Schizoaffective disorder   . Depression, major   . Hypertension     non complaint   Axis IV: occupational problems, other psychosocial or environmental problems and problems with primary support group Axis V: 41-50 serious symptoms  ADL's:  Impaired  Sleep: Good  Appetite:  Fair  Suicidal Ideation:  Patient would not answer Homicidal Ideation:  Patient would not answer AEB (as evidenced by):  Psychiatric Specialty Exam: Review of Systems  Unable to perform ROS: acuity of condition    Blood pressure 125/85, pulse 96, temperature 98 F (36.7 C), temperature source Oral, resp. rate 18, height 5' 8.9" (1.75 m), weight 105.235 kg (232 lb).Body mass index is 34.36 kg/(m^2).  General Appearance: Disheveled  Eye Contact::  Poor  Speech:  Unable to assess  Volume:  Unable to assess  Mood:  Depressed  Affect:  Flat  Thought Process:  Unable to assess  Orientation:  Other:  Unable to assess   Thought Content:  Unable to assess  Suicidal Thoughts:  Patient would not respond to Clinical research associate  Homicidal Thoughts:  Patient would not respond to Clinical research associate  Memory:  Unable to assess  Judgement:  Poor  Insight:  Lacking  Psychomotor Activity:  Decreased  Concentration:  Poor  Recall:  Unable to assess  Akathisia:  No  Handed:  Right  AIMS (if indicated):     Assets:  Intimacy Leisure  Time Physical Health Resilience  Sleep:  Number of Hours: 4   Current Medications: Current Facility-Administered Medications  Medication Dose Route Frequency Provider Last Rate Last Dose  . acetaminophen (TYLENOL) tablet 650 mg  650 mg Oral Q6H PRN Kerry Hough, PA-C      . alum & mag hydroxide-simeth (MAALOX/MYLANTA) 200-200-20 MG/5ML suspension 30 mL  30 mL Oral Q4H PRN Kerry Hough, PA-C      . benztropine (COGENTIN) tablet 1 mg  1 mg Oral QHS Mojeed Akintayo   1 mg at 11/12/12 2125  . fluPHENAZine (PROLIXIN) tablet 5 mg  5 mg Oral QHS Mojeed Akintayo   5 mg at 11/12/12 2126  . lisinopril (PRINIVIL,ZESTRIL) tablet 5 mg  5 mg Oral Daily Fransisca Kaufmann, NP   5 mg at 11/13/12 0954  . LORazepam (ATIVAN) tablet 1 mg  1 mg Oral BID Fransisca Kaufmann, NP   1 mg at 11/13/12 0951   Or  . LORazepam (ATIVAN) injection 1 mg  1 mg Intramuscular BID Fransisca Kaufmann, NP   1 mg at 11/11/12 1701  . magnesium hydroxide (MILK OF MAGNESIA) suspension 30 mL  30 mL Oral Daily PRN Kerry Hough, PA-C      . potassium chloride SA (K-DUR,KLOR-CON) CR tablet 20 mEq  20 mEq Oral BID Fransisca Kaufmann, NP   20 mEq at 11/13/12 0955  . sertraline (ZOLOFT) tablet 50 mg  50 mg Oral Daily Mojeed Akintayo   50 mg at  11/13/12 0957  . traZODone (DESYREL) tablet 50 mg  50 mg Oral QHS,MR X 1 Kerry Hough, PA-C   50 mg at 11/12/12 2126    Lab Results:  Results for orders placed during the hospital encounter of 11/11/12 (from the past 48 hour(s))  GLUCOSE, CAPILLARY     Status: None   Collection Time    11/12/12  6:12 AM      Result Value Range   Glucose-Capillary 92  70 - 99 mg/dL   Comment 1 Notify RN     Comment 2 Documented in Chart    HEMOGLOBIN A1C     Status: Abnormal   Collection Time    11/13/12  6:46 AM      Result Value Range   Hemoglobin A1C 6.2 (*) <5.7 %   Comment: (NOTE)                                                                               According to the ADA Clinical Practice Recommendations for  2011, when     HbA1c is used as a screening test:      >=6.5%   Diagnostic of Diabetes Mellitus               (if abnormal result is confirmed)     5.7-6.4%   Increased risk of developing Diabetes Mellitus     References:Diagnosis and Classification of Diabetes Mellitus,Diabetes     Care,2011,34(Suppl 1):S62-S69 and Standards of Medical Care in             Diabetes - 2011,Diabetes Care,2011,34 (Suppl 1):S11-S61.   Mean Plasma Glucose 131 (*) <117 mg/dL   Comment: Performed at [facility]    Physical Findings: AIMS: Facial and Oral Movements Muscles of Facial Expression: None, normal Lips and Perioral Area: None, normal Jaw: None, normal Tongue: None, normal,Extremity Movements Upper (arms, wrists, hands, fingers): None, normal Lower (legs, knees, ankles, toes): None, normal, Trunk Movements Neck, shoulders, hips: None, normal, Overall Severity Severity of abnormal movements (highest score from questions above): None, normal Incapacitation due to abnormal movements: None, normal Patient's awareness of abnormal movements (rate only patient's report): No Awareness,    CIWA:    COWS:     Treatment Plan Summary: Daily contact with patient to assess and evaluate symptoms and progress in treatment Medication management  Plan: Continue crisis management and stabilization.  Medication management: Continue Cogentin 1 mg at hs for EPS prevention. Continue Prolixin 5 mg hs for psychosis. Continue Zoloft 50 mg daily for depressive symptoms.  May consider increasing prolixin if no improvement or changing to another antipsychotic. Also may reassess for depression or increase in zoloft by tomorrow. Discharge plan in progress.  Continue current treatment plan.  Address health issues: Vitals improved since being started on Lisinopril 5 mg daily. Replacing low potassium level with K-DUR.  Medical Decision Making Problem Points:  Established problem, stable/improving (1) and Review of  psycho-social stressors (1) Data Points:  Review of medication regiment & side effects (2) Review of new medications or change in dosage (2)  I certify that inpatient services furnished can reasonably be expected to improve the patient's condition.   Gilmore Laroche, Angelina Venard NP-C  11/13/2012, 3:06 PM

## 2012-11-14 LAB — GLUCOSE, CAPILLARY: Glucose-Capillary: 107 mg/dL — ABNORMAL HIGH (ref 70–99)

## 2012-11-14 MED ORDER — FLUPHENAZINE HCL 5 MG PO TABS
5.0000 mg | ORAL_TABLET | Freq: Two times a day (BID) | ORAL | Status: DC
  Administered 2012-11-14 – 2012-11-15 (×2): 5 mg via ORAL
  Filled 2012-11-14 (×4): qty 1

## 2012-11-14 MED ORDER — SERTRALINE HCL 100 MG PO TABS
100.0000 mg | ORAL_TABLET | Freq: Every day | ORAL | Status: DC
  Administered 2012-11-15 – 2012-11-18 (×4): 100 mg via ORAL
  Filled 2012-11-14 (×5): qty 1

## 2012-11-14 NOTE — Progress Notes (Signed)
Patient ID: Stanley Watkins, male   DOB: 18-Apr-1950, 62 y.o.   MRN: [id number] Patient ID: Stanley Watkins, male   DOB: November 27, 1950, 62 y.o.   MRN: [id number] [facility] MD Progress Note  11/14/2012 12:31 PM Stanley Watkins  MRN:  [id number] Subjective:  Patient lying in bed not interested to talk. Objective:  In bed not interested to get up. Nurse note reviewed , still remains aloof and withdrawn. Affect blunted. Diagnosis:   DSM5: Axis I: Schizoaffective Disorder Axis II: Deferred Axis III:  Past Medical History  Diagnosis Date  . Diabetes mellitus without complication     pre diabetes  . Schizoaffective disorder   . Depression, major   . Hypertension     non complaint   Axis IV: occupational problems, other psychosocial or environmental problems and problems with primary support group Axis V: 41-50 serious symptoms  ADL's:  Impaired  Sleep: Good  Appetite:  Fair  Suicidal Ideation:  Patient would not answer Homicidal Ideation:  Patient would not answer AEB (as evidenced by):  Psychiatric Specialty Exam: Review of Systems  Unable to perform ROS: acuity of condition    Blood pressure 125/85, pulse 96, temperature 98 F (36.7 C), temperature source Oral, resp. rate 18, height 5' 8.9" (1.75 m), weight 105.235 kg (232 lb).Body mass index is 34.36 kg/(m^2).  General Appearance: Disheveled  Eye Contact::  Poor  Speech:  Unable to assess  Volume:  Unable to assess  Mood:  Depressed  Affect:  Flat  Thought Process:  Unable to assess  Orientation:  Other:  Unable to assess   Thought Content:  Unable to assess  Suicidal Thoughts:  Patient would not respond to Clinical research associate  Homicidal Thoughts:  Patient would not respond to Clinical research associate  Memory:  Unable to assess  Judgement:  Poor  Insight:  Lacking  Psychomotor Activity:  Decreased  Concentration:  Poor  Recall:  Unable to assess  Akathisia:  No  Handed:  Right  AIMS (if indicated):     Assets:  Intimacy Leisure  Time Physical Health Resilience  Sleep:  Number of Hours: 6.75   Current Medications: Current Facility-Administered Medications  Medication Dose Route Frequency Provider Last Rate Last Dose  . acetaminophen (TYLENOL) tablet 650 mg  650 mg Oral Q6H PRN Kerry Hough, PA-C      . alum & mag hydroxide-simeth (MAALOX/MYLANTA) 200-200-20 MG/5ML suspension 30 mL  30 mL Oral Q4H PRN Kerry Hough, PA-C      . benztropine (COGENTIN) tablet 1 mg  1 mg Oral QHS Mojeed Akintayo   1 mg at 11/13/12 2138  . fluPHENAZine (PROLIXIN) tablet 5 mg  5 mg Oral BID Thresa Ross, MD      . lisinopril (PRINIVIL,ZESTRIL) tablet 5 mg  5 mg Oral Daily Fransisca Kaufmann, NP   5 mg at 11/14/12 0800  . LORazepam (ATIVAN) tablet 1 mg  1 mg Oral BID Fransisca Kaufmann, NP   1 mg at 11/14/12 0800   Or  . LORazepam (ATIVAN) injection 1 mg  1 mg Intramuscular BID Fransisca Kaufmann, NP   1 mg at 11/11/12 1701  . magnesium hydroxide (MILK OF MAGNESIA) suspension 30 mL  30 mL Oral Daily PRN Kerry Hough, PA-C      . [START ON 11/15/2012] sertraline (ZOLOFT) tablet 100 mg  100 mg Oral Daily Thresa Ross, MD      . traZODone (DESYREL) tablet 50 mg  50 mg Oral QHS,MR X 1 Kerry Hough, PA-C  50 mg at 11/13/12 2138    Lab Results:  Results for orders placed during the hospital encounter of 11/11/12 (from the past 48 hour(s))  HEMOGLOBIN A1C     Status: Abnormal   Collection Time    11/13/12  6:46 AM      Result Value Range   Hemoglobin A1C 6.2 (*) <5.7 %   Comment: (NOTE)                                                                               According to the ADA Clinical Practice Recommendations for 2011, when     HbA1c is used as a screening test:      >=6.5%   Diagnostic of Diabetes Mellitus               (if abnormal result is confirmed)     5.7-6.4%   Increased risk of developing Diabetes Mellitus     References:Diagnosis and Classification of Diabetes Mellitus,Diabetes     Care,2011,34(Suppl 1):S62-S69 and Standards of  Medical Care in             Diabetes - 2011,Diabetes Care,2011,34 (Suppl 1):S11-S61.   Mean Plasma Glucose 131 (*) <117 mg/dL   Comment: Performed at [facility]    Physical Findings: AIMS: Facial and Oral Movements Muscles of Facial Expression: None, normal Lips and Perioral Area: None, normal Jaw: None, normal Tongue: None, normal,Extremity Movements Upper (arms, wrists, hands, fingers): None, normal Lower (legs, knees, ankles, toes): None, normal, Trunk Movements Neck, shoulders, hips: None, normal, Overall Severity Severity of abnormal movements (highest score from questions above): None, normal Incapacitation due to abnormal movements: None, normal Patient's awareness of abnormal movements (rate only patient's report): No Awareness,    CIWA:    COWS:     Treatment Plan Summary: Daily contact with patient to assess and evaluate symptoms and progress in treatment Medication management  Plan: Continue crisis management and stabilization.  Medication management: Continue Cogentin 1 mg at hs for EPS prevention. Will increase zoloft to 100mg  and prolixin to 5mg  bid considering poor response and improvement to prior doses. Discharge plan in progress.  Continue current treatment plan.   Medical Decision Making Problem Points:  Established problem, stable/improving (1), Review of last therapy session (1) and Review of psycho-social stressors (1) Data Points:  Review of medication regiment & side effects (2) Review of new medications or change in dosage (2)  I certify that inpatient services furnished can reasonably be expected to improve the patient's condition.   Sarim Rothman NP-C 11/14/2012, 12:31 PM

## 2012-11-14 NOTE — Progress Notes (Signed)
D: Presents as non verbal, in bed laying down majority of shift. Avoids eye contact and refuses to speak when this writer attempts to engage in conversation. Did take medication and observed getting up to use the bathroom but otherwise remains in bed all shift. A: Support and encouragement given, encouraged group attendance. Meds given as ordered. Encourage to verbalize feelings. Maintained Q15 min checks for safety. R: Remains isolative to room, non verbal and only up to use bathroom.

## 2012-11-14 NOTE — Clinical Social Work Note (Signed)
Clinical social work note  On 11/13/12 and 11/14/12, CSW attempted twice daily to meet with patient to do PSA, but was informed each time by staff that he remained mute and unable to be interviewed.  Ambrose Mantle, LCSW 11/14/2012, 4:35 PM

## 2012-11-14 NOTE — Progress Notes (Signed)
Patient ID: Stanley Watkins, male   DOB: [birth year], 62 y.o.   MRN: [id number] The patient remained in bed all evening resting with his eyes closed. He was not asleep. Encouraged to attend evening group, but remained in bed. He refused to respond when attempt made to draw blood for CBG. However, with his eyes remaining shut he extended his hand to receive his oral medication. Swallowed his medications without opening his eyes. Will continue to monitor.

## 2012-11-14 NOTE — [facility] Group Notes (Signed)
[facility] Group Notes: (Clinical Social Work)   11/14/2012      Type of Therapy:  Group Therapy   Participation Level:  Did Not Attend    Ambrose Mantle, LCSW 11/14/2012, 12:51 PM

## 2012-11-15 LAB — GLUCOSE, CAPILLARY

## 2012-11-15 MED ORDER — FLUPHENAZINE HCL 2.5 MG PO TABS
7.5000 mg | ORAL_TABLET | Freq: Two times a day (BID) | ORAL | Status: DC
  Administered 2012-11-16 – 2012-11-18 (×5): 7.5 mg via ORAL
  Filled 2012-11-15 (×8): qty 3

## 2012-11-15 NOTE — Progress Notes (Signed)
Pt resting in bed with eyes closed.  No distress observed.  Respirations even/unlabored.  Safety maintained with q15 minute checks. 

## 2012-11-15 NOTE — [facility] Group Notes (Signed)
[facility] LCSW Group Therapy  11/15/2012 1:15 pm  Type of Therapy: Process Group Therapy  Did not attend   Summary of Progress/Problems: Today's group addressed the issue of overcoming obstacles.  Patients were asked to identify their biggest obstacle post d/c that stands in the way of their on-going success, and then problem solve as to how to manage this.  Daryel Gerald B 11/15/2012   3:26 PM

## 2012-11-15 NOTE — Progress Notes (Signed)
Pt standing in hallway looking out door window. Writer approached pt and offered meds. Pt selectively mute. No nonverbal cues indicating that pt was willing to take meds. Writer informed pt that it would be documented that he refused his meds.

## 2012-11-15 NOTE — Progress Notes (Signed)
D: Pt presents with flat affect. Depressed mood. Pt selectively mute. Writer UTA for SI/HI/AVH. Pt is guarded and forwards no information during assessment. Pt has poor hygiene/body odor. Pt compliant with taking meds. Pt refuses to get out of bed. A: Medications administered as ordered per MD. Margot Ables support given. Pt encouraged to attend groups, perform ADL and verbalize feelings. 15 minute checks performed for safety. R: Pt remains safe at this time.

## 2012-11-15 NOTE — Progress Notes (Signed)
Seen and agreed. Jessa Stinson, MD 

## 2012-11-15 NOTE — [facility] Suicide Risk Assessment (Signed)
[facility] INPATIENT:  Family/Significant Other Suicide Prevention Education  Suicide Prevention Education:  Education Completed; No one has been identified by the patient as the family member/significant other with whom the patient will be residing, and identified as the person(s) who will aid the patient in the event of a mental health crisis (suicidal ideations/suicide attempt).  With written consent from the patient, the family member/significant other has been provided the following suicide prevention education, prior to the and/or following the discharge of the patient.  The suicide prevention education provided includes the following:  Suicide risk factors  Suicide prevention and interventions  National Suicide Hotline telephone number  [facility] assessment telephone number  [facility] Emergency Assistance 911  [facility] and/or Residential Mobile Crisis Unit telephone number  Request made of family/significant other to:  Remove weapons (e.g., guns, rifles, knives), all items previously/currently identified as safety concern.    Remove drugs/medications (over-the-counter, prescriptions, illicit drugs), all items previously/currently identified as a safety concern.  The family member/significant other verbalizes understanding of the suicide prevention education information provided.  The family member/significant other agrees to remove the items of safety concern listed above. The patient did not endorse SI at the time of admission, nor did the patient c/o SI during the stay here.  SPE not required.   Daryel Gerald B 11/15/2012, 4:05 PM

## 2012-11-15 NOTE — Progress Notes (Signed)
Patient ID: Stanley Watkins, male   DOB: 04-03-1950, 62 y.o.   MRN: [id number] [facility] MD Progress Note  11/15/2012 10:58 AM Dewey Viens Carmicheal  MRN:  [id number] Subjective: Patient seeing laying bed refusing to talk and not maintaining eye contact. However, he answers some questions by nodding his head. He remains withdrawn, guarded with no interaction with peers or the staffs except when he wants to eat or take his medication. Patient has been refusing to maintain a good personal hygiene. He has been observed talking to himself as if responding to internal stimuli. Diagnosis:   DSM5: Axis I: Schizoaffective Disorder Axis II: Deferred Axis III:  Past Medical History  Diagnosis Date  . Diabetes mellitus without complication     pre diabetes  . Schizoaffective disorder   . Depression, major   . Hypertension     non complaint   Axis IV: occupational problems, other psychosocial or environmental problems and problems with primary support group Axis V: 41-50 serious symptoms  ADL's:  Impaired  Sleep: Good  Appetite:  Fair  Suicidal Ideation:  Patient would not answer Homicidal Ideation:  Patient would not answer AEB (as evidenced by):  Psychiatric Specialty Exam: Review of Systems  Unable to perform ROS: acuity of condition    Blood pressure 130/89, pulse 99, temperature 98.1 F (36.7 C), temperature source Oral, resp. rate 20, height 5' 8.9" (1.75 m), weight 105.235 kg (232 lb).Body mass index is 34.36 kg/(m^2).  General Appearance: Disheveled  Eye Contact::  Poor  Speech:  Unable to assess  Volume:  Unable to assess  Mood:  Depressed  Affect:  Flat  Thought Process:  Unable to assess  Orientation:  Other:  Unable to assess   Thought Content:  Unable to assess  Suicidal Thoughts:  Patient would not respond to Clinical research associate  Homicidal Thoughts:  Patient would not respond to Clinical research associate  Memory:  Unable to assess  Judgement:  Poor  Insight:  Lacking  Psychomotor Activity:   Decreased  Concentration:  Poor  Recall:  Unable to assess  Akathisia:  No  Handed:  Right  AIMS (if indicated):     Assets:  Intimacy Leisure Time Physical Health Resilience  Sleep:  Number of Hours: 5.75   Current Medications: Current Facility-Administered Medications  Medication Dose Route Frequency Provider Last Rate Last Dose  . acetaminophen (TYLENOL) tablet 650 mg  650 mg Oral Q6H PRN Kerry Hough, PA-C      . alum & mag hydroxide-simeth (MAALOX/MYLANTA) 200-200-20 MG/5ML suspension 30 mL  30 mL Oral Q4H PRN Kerry Hough, PA-C      . benztropine (COGENTIN) tablet 1 mg  1 mg Oral QHS Lailyn Appelbaum   1 mg at 11/14/12 2134  . fluPHENAZine (PROLIXIN) tablet 7.5 mg  7.5 mg Oral BID Murrell Dome      . lisinopril (PRINIVIL,ZESTRIL) tablet 5 mg  5 mg Oral Daily Fransisca Kaufmann, NP   5 mg at 11/15/12 0847  . LORazepam (ATIVAN) tablet 1 mg  1 mg Oral BID Fransisca Kaufmann, NP   1 mg at 11/15/12 0847   Or  . LORazepam (ATIVAN) injection 1 mg  1 mg Intramuscular BID Fransisca Kaufmann, NP   1 mg at 11/11/12 1701  . magnesium hydroxide (MILK OF MAGNESIA) suspension 30 mL  30 mL Oral Daily PRN Kerry Hough, PA-C      . sertraline (ZOLOFT) tablet 100 mg  100 mg Oral Daily Thresa Ross, MD   100 mg at 11/15/12  28  . traZODone (DESYREL) tablet 50 mg  50 mg Oral QHS,MR X 1 Kerry Hough, PA-C   50 mg at 11/14/12 2134    Lab Results:  Results for orders placed during the hospital encounter of 11/11/12 (from the past 48 hour(s))  GLUCOSE, CAPILLARY     Status: Abnormal   Collection Time    11/14/12  6:29 PM      Result Value Range   Glucose-Capillary 107 (*) 70 - 99 mg/dL   Comment 1 Documented in Chart     Comment 2 Notify RN    GLUCOSE, CAPILLARY     Status: Abnormal   Collection Time    11/15/12  8:25 AM      Result Value Range   Glucose-Capillary 101 (*) 70 - 99 mg/dL   Comment 1 Documented in Chart     Comment 2 Notify RN      Physical Findings: AIMS: Facial and Oral  Movements Muscles of Facial Expression: None, normal Lips and Perioral Area: None, normal Jaw: None, normal Tongue: None, normal,Extremity Movements Upper (arms, wrists, hands, fingers): None, normal Lower (legs, knees, ankles, toes): None, normal, Trunk Movements Neck, shoulders, hips: None, normal, Overall Severity Severity of abnormal movements (highest score from questions above): None, normal Incapacitation due to abnormal movements: None, normal Patient's awareness of abnormal movements (rate only patient's report): No Awareness,    CIWA:    COWS:     Treatment Plan Summary: Daily contact with patient to assess and evaluate symptoms and progress in treatment Medication management  Plan: Continue crisis management and stabilization.  Medication management: Continue Cogentin 1 mg at qhs for EPS prevention.  Continue Prolixin 5 mg hs for psychosis.  Continue Zoloft 50 mg daily for depressive symptoms.  Continue Lisinopril 5 mg daily. Replacing low potassium level with K-DUR.  Medical Decision Making Problem Points:  Established problem, stable/improving (1) and Review of psycho-social stressors (1) Data Points:  Review of medication regiment & side effects (2) Review of new medications or change in dosage (2)  I certify that inpatient services furnished can reasonably be expected to improve the patient's condition.   Thedore Mins, MD 11/15/2012, 10:58 AM

## 2012-11-15 NOTE — Progress Notes (Signed)
Adult Psychoeducational Group Note  Date:  11/15/2012 Time:  2:02 PM  Group Topic/Focus:  Dimensions of Wellness:   The focus of this group is to introduce the topic of wellness and discuss the role each dimension of wellness plays in total health.  Participation Level:  Did Not Attend  Participation Quality:  did not attend group  Affect:  did not attend group  Cognitive:  did not attend group  Insight: did not attend group  Engagement in Group:  did not attend group  Modes of Intervention:  did not attend group  Additional Comments:  did not attend group  [place] Shi K 11/15/2012, 2:02 PM

## 2012-11-15 NOTE — Progress Notes (Signed)
Patient ID: Stanley Watkins, male   DOB: 06-13-1950, 62 y.o.   MRN: [id number] D. The patient spent the evening in bed with his eyes closed. Body odor present. A.Encouraged to attend evening group. Encouraged to shower. Attempts made to obtain blood for CBG. Administered medication. R. The patient remained in bed all evening with eyes closed. Did not attend group or shower. Did not eat a snack. Would not allow tech to do a finger stick for CBG. Compliant with medication.

## 2012-11-15 NOTE — Progress Notes (Signed)
Recreation Therapy Notes  Date: 09.08.2014 Time: 9:30am Location: [address] Dayroom  Group Topic: Wellness  Goal Area(s) Addresses:  Patient will define components of whole wellness. Patient will verbalize benefit of whole wellness.  Behavioral Response: Did not attend  Jearl Klinefelter, LRT/CTRS  Jearl Klinefelter 11/15/2012 4:47 PM

## 2012-11-15 NOTE — [facility] Group Notes (Signed)
[facility] LCSW Aftercare Discharge Planning Group Note   11/15/2012 10:49 AM  Participation Quality:  Did not attend    [place]

## 2012-11-15 NOTE — Progress Notes (Signed)
Psychoeducational Group Note  Date:  11/15/2012 Time:  2000  Group Topic/Focus:  Wrap-Up Group:   The focus of this group is to help patients review their daily goal of treatment and discuss progress on daily workbooks.  Participation Level: Did Not Attend  Participation Quality:  Not Applicable  Affect:  Not Applicable  Cognitive:  Not Applicable  Insight:  Not Applicable  Engagement in Group: Not Applicable  Additional Comments:  The patient did not attend group since he was asleep.   Alyne Martinson S 11/15/2012, 2:11 AM

## 2012-11-16 LAB — GLUCOSE, CAPILLARY: Glucose-Capillary: 99 mg/dL (ref 70–99)

## 2012-11-16 MED ORDER — METFORMIN HCL 500 MG PO TABS
500.0000 mg | ORAL_TABLET | Freq: Two times a day (BID) | ORAL | Status: DC
  Administered 2012-11-16 – 2012-11-23 (×8): 500 mg via ORAL
  Filled 2012-11-16 (×8): qty 1
  Filled 2012-11-16: qty 28
  Filled 2012-11-16: qty 1
  Filled 2012-11-16: qty 28
  Filled 2012-11-16 (×7): qty 1

## 2012-11-16 NOTE — Progress Notes (Signed)
Patient ID: Stanley Watkins, male   DOB: 04-08-1950, 62 y.o.   MRN: [id number] [facility] MD Progress Note  11/16/2012 10:39 AM Stanley Watkins  MRN:  [id number] Subjective: Patient seeing laying bed refusing to talk and not maintaining eye contact. He remains guarded and withdrawn. Patient is still not speaking with staff. Patient would not look at writer this morning nor answer any questions asked of him. Nursing staff report the patient will only take medications if resting in the bed. He is not cooperative with care for example he will not show his armband when asked by staff.   Diagnosis:   DSM5: Axis I: Schizoaffective Disorder Axis II: Deferred Axis III:  Past Medical History  Diagnosis Date  . Diabetes mellitus without complication     pre diabetes  . Schizoaffective disorder   . Depression, major   . Hypertension     non complaint   Axis IV: occupational problems, other psychosocial or environmental problems and problems with primary support group Axis V: 41-50 serious symptoms  ADL's:  Impaired  Sleep: Good  Appetite:  Fair  Suicidal Ideation:  Patient would not answer Homicidal Ideation:  Patient would not answer AEB (as evidenced by):  Psychiatric Specialty Exam: Review of Systems  Unable to perform ROS: acuity of condition    Blood pressure 130/89, pulse 99, temperature 98.1 F (36.7 C), temperature source Oral, resp. rate 20, height 5' 8.9" (1.75 m), weight 105.235 kg (232 lb).Body mass index is 34.36 kg/(m^2).  General Appearance: Disheveled  Eye Contact::  Poor  Speech:  Unable to assess  Volume:  Unable to assess  Mood:  Depressed  Affect:  Flat  Thought Process:  Unable to assess  Orientation:  Other:  Unable to assess   Thought Content:  Unable to assess  Suicidal Thoughts:  Patient would not respond to Clinical research associate  Homicidal Thoughts:  Patient would not respond to Clinical research associate  Memory:  Unable to assess  Judgement:  Poor  Insight:  Lacking   Psychomotor Activity:  Decreased  Concentration:  Poor  Recall:  Unable to assess  Akathisia:  No  Handed:  Right  AIMS (if indicated):     Assets:  Intimacy Leisure Time Physical Health Resilience  Sleep:  Number of Hours: 1.5   Current Medications: Current Facility-Administered Medications  Medication Dose Route Frequency Provider Last Rate Last Dose  . acetaminophen (TYLENOL) tablet 650 mg  650 mg Oral Q6H PRN Kerry Hough, PA-C      . alum & mag hydroxide-simeth (MAALOX/MYLANTA) 200-200-20 MG/5ML suspension 30 mL  30 mL Oral Q4H PRN Kerry Hough, PA-C      . benztropine (COGENTIN) tablet 1 mg  1 mg Oral QHS Mojeed Akintayo   1 mg at 11/15/12 2326  . fluPHENAZine (PROLIXIN) tablet 7.5 mg  7.5 mg Oral BID Mojeed Akintayo   7.5 mg at 11/16/12 0813  . lisinopril (PRINIVIL,ZESTRIL) tablet 5 mg  5 mg Oral Daily Fransisca Kaufmann, NP   5 mg at 11/16/12 0813  . LORazepam (ATIVAN) tablet 1 mg  1 mg Oral BID Fransisca Kaufmann, NP   1 mg at 11/16/12 0813   Or  . LORazepam (ATIVAN) injection 1 mg  1 mg Intramuscular BID Fransisca Kaufmann, NP   1 mg at 11/11/12 1701  . magnesium hydroxide (MILK OF MAGNESIA) suspension 30 mL  30 mL Oral Daily PRN Kerry Hough, PA-C      . metFORMIN (GLUCOPHAGE) tablet 500 mg  500 mg Oral  BID WC Fransisca Kaufmann, NP      . sertraline (ZOLOFT) tablet 100 mg  100 mg Oral Daily Thresa Ross, MD   100 mg at 11/16/12 0813  . traZODone (DESYREL) tablet 50 mg  50 mg Oral QHS,MR X 1 Kerry Hough, PA-C   50 mg at 11/15/12 2328    Lab Results:  Results for orders placed during the hospital encounter of 11/11/12 (from the past 48 hour(s))  GLUCOSE, CAPILLARY     Status: Abnormal   Collection Time    11/14/12  6:29 PM      Result Value Range   Glucose-Capillary 107 (*) 70 - 99 mg/dL   Comment 1 Documented in Chart     Comment 2 Notify RN    GLUCOSE, CAPILLARY     Status: Abnormal   Collection Time    11/15/12  8:25 AM      Result Value Range   Glucose-Capillary 101 (*) 70  - 99 mg/dL   Comment 1 Documented in Chart     Comment 2 Notify RN    GLUCOSE, CAPILLARY     Status: None   Collection Time    11/15/12  5:10 PM      Result Value Range   Glucose-Capillary 95  70 - 99 mg/dL   Comment 1 Documented in Chart     Comment 2 Notify RN      Physical Findings: AIMS: Facial and Oral Movements Muscles of Facial Expression: None, normal Lips and Perioral Area: None, normal Jaw: None, normal Tongue: None, normal,Extremity Movements Upper (arms, wrists, hands, fingers): None, normal Lower (legs, knees, ankles, toes): None, normal, Trunk Movements Neck, shoulders, hips: None, normal, Overall Severity Severity of abnormal movements (highest score from questions above): None, normal Incapacitation due to abnormal movements: None, normal Patient's awareness of abnormal movements (rate only patient's report): No Awareness,    CIWA:    COWS:     Treatment Plan Summary: Daily contact with patient to assess and evaluate symptoms and progress in treatment Medication management  Plan: Continue crisis management and stabilization.  Medication management: Continue Cogentin 1 mg at qhs for EPS prevention.  Continue Prolixin 7.5 mg hs for psychosis.  Continue Zoloft 50 mg daily for depressive symptoms.  Continue Lisinopril 5 mg daily. Consult with Dr. Dub Mikes for second opinion for forced medications as the patient is not completely medication compliant. Case manager to contact wife to come in to meet with the treatment team.  Address health issues: Vitals reviewed and stable. Patient needing encouragement to shower and tend to hygiene needs. Started on Metformin 500 mg BID for A1C of 6.2. CBG checks ordered TID. Medical Decision Making Problem Points:  Established problem, worsening  Data Points:  Review of medication regiment & side effects (2) Review of new medications or change in dosage (2)  I certify that inpatient services furnished can reasonably be expected to  improve the patient's condition.   Fransisca Kaufmann, NP-C 11/16/2012, 10:39 AM

## 2012-11-16 NOTE — [facility] Group Notes (Signed)
Adult Psychoeducational Group Note  Date:  11/16/2012 Time:  10:10 PM  Group Topic/Focus:  Wrap-Up Group:   The focus of this group is to help patients review their daily goal of treatment and discuss progress on daily workbooks.  Participation Level:  Did Not Attend  Participation Quality:  None  Affect:  None  Cognitive:  None  Insight: None  Engagement in Group:  None  Modes of Intervention:  Discussion  Additional Comments:  [place] did not attend group.  Caroll Rancher A 11/16/2012, 10:10 PM

## 2012-11-16 NOTE — Progress Notes (Signed)
Patient ID: Stanley Watkins, male   DOB: 08-30-1950, 62 y.o.   MRN: [id number]   D: Pt was laying in bed during the assessment. Writer attempted to get pt to attend group, however pt continued to lay in bed and would not Probation officer. Writer mentioned to pt that in report she was informed he'd spoken to the mht stating, "he was ready to go home. And that pt also asked her nationality." Pt briefly looked up at the writer but never spoke.  A:  Support and encouragement was offered. 15 min checks continued for safety.  R: Pt remains safe.

## 2012-11-16 NOTE — [facility] Group Notes (Signed)
[facility] LCSW Group Therapy  11/16/2012 , 2:14 PM   Type of Therapy:  Group Therapy  Participation Level: Did not attend    Summary of Progress/Problems: Today's group focused on the term Diagnosis.  Participants were asked to define the term, and then pronounce whether it is a negative, positive or neutral term.  Stanley Watkins 11/16/2012 , 2:14 PM

## 2012-11-16 NOTE — Progress Notes (Signed)
D: Pt presents wit flat affect. Depressed mood. Selectively mute. Writer UTA pt during assessment. Pt do not appear to be in distress. Resp are even and unlabored. Pt has poor hygiene/ odor. A: medications administered as ordered per MD. Stanley Watkins support given pt encouraged to attend groups. Pt encouraged to performed ADL. 15 minute checks performed for safety. R: Pt remains safe at this time.

## 2012-11-16 NOTE — Progress Notes (Signed)
Patient ID: Stanley Watkins, male   DOB: [birth year], 62 y.o.   MRN: [id number]   D: Writer introduced self to the pt, however, pt wouldn't speak or give eye contact. Writer stood for several minutes calling pt's name. Informed the pt that hs meds were due as well as the names. After several minutes writer informed the pt that if he did not take the meds his Dr would have to be notified that he (pt) refused them. Pt eventually put his hand out to retrieve the meds. Writer asked pt to show the arm with the armband, but he wouldn't.  Writer adm meds to pt and stayed in the room in an attempt to make sure the meds were swallowed.   A:  Support and encouragement was offered. 15 min checks continued for safety.  R: Pt remains safe.

## 2012-11-17 LAB — GLUCOSE, CAPILLARY: Glucose-Capillary: 115 mg/dL — ABNORMAL HIGH (ref 70–99)

## 2012-11-17 NOTE — Progress Notes (Signed)
Patient ID: Stanley Watkins, male   DOB: 1950-10-10, 62 y.o.   MRN: [id number]  [facility] MD Progress Note  11/17/2012 11:06 AM Stanley Watkins  MRN:  [id number] Subjective: Patient states with prompting from writer "I want to go home." The patient did not respond to further questions.  Objective:  Patient observed lying in the bed staring blankly ahead. Writer informed patient that if he wanted to go home he would need to attend groups and take medications. Nursing staff found medication pills this morning under his bed. Later staff reported that the patient was walking in the hallway. Patient was informed by writer also that he would be put on the wait list for [facility] as no signs of improvement had been observed by the treatment team. Staff report patient has not showered in days possibly since his admission and has a strong body odor present.   Diagnosis:   DSM5: Axis I: Schizoaffective Disorder Axis II: Deferred Axis III:  Past Medical History  Diagnosis Date  . Diabetes mellitus without complication     pre diabetes  . Schizoaffective disorder   . Depression, major   . Hypertension     non complaint   Axis IV: occupational problems, other psychosocial or environmental problems and problems with primary support group Axis V: 41-50 serious symptoms  ADL's:  Impaired  Sleep: Good  Appetite:  Fair  Suicidal Ideation:  Patient would not answer Homicidal Ideation:  Patient would not answer AEB (as evidenced by):  Psychiatric Specialty Exam: Review of Systems  Unable to perform ROS: acuity of condition    Blood pressure 139/82, pulse 87, temperature 97.9 F (36.6 C), temperature source Oral, resp. rate 18, height 5' 8.9" (1.75 m), weight 105.235 kg (232 lb).Body mass index is 34.36 kg/(m^2).  General Appearance: Disheveled  Eye Contact::  Poor  Speech:  Unable to assess  Volume:  Unable to assess  Mood:  Depressed  Affect:  Flat  Thought Process:  Unable to assess   Orientation:  Other:  Unable to assess   Thought Content:  Unable to assess  Suicidal Thoughts:  Patient would not respond to Clinical research associate  Homicidal Thoughts:  Patient would not respond to Clinical research associate  Memory:  Unable to assess  Judgement:  Poor  Insight:  Lacking  Psychomotor Activity:  Decreased  Concentration:  Poor  Recall:  Unable to assess  Akathisia:  No  Handed:  Right  AIMS (if indicated):     Assets:  Intimacy Leisure Time Physical Health Resilience  Sleep:  Number of Hours: 4.25   Current Medications: Current Facility-Administered Medications  Medication Dose Route Frequency Provider Last Rate Last Dose  . acetaminophen (TYLENOL) tablet 650 mg  650 mg Oral Q6H PRN Kerry Hough, PA-C      . alum & mag hydroxide-simeth (MAALOX/MYLANTA) 200-200-20 MG/5ML suspension 30 mL  30 mL Oral Q4H PRN Kerry Hough, PA-C      . benztropine (COGENTIN) tablet 1 mg  1 mg Oral QHS Mojeed Akintayo   1 mg at 11/16/12 2141  . fluPHENAZine (PROLIXIN) tablet 7.5 mg  7.5 mg Oral BID Mojeed Akintayo   7.5 mg at 11/17/12 0749  . lisinopril (PRINIVIL,ZESTRIL) tablet 5 mg  5 mg Oral Daily Fransisca Kaufmann, NP   5 mg at 11/17/12 0749  . LORazepam (ATIVAN) tablet 1 mg  1 mg Oral BID Fransisca Kaufmann, NP   1 mg at 11/17/12 0749   Or  . LORazepam (ATIVAN) injection 1 mg  1 mg Intramuscular BID Fransisca Kaufmann, NP   1 mg at 11/11/12 1701  . magnesium hydroxide (MILK OF MAGNESIA) suspension 30 mL  30 mL Oral Daily PRN Kerry Hough, PA-C      . metFORMIN (GLUCOPHAGE) tablet 500 mg  500 mg Oral BID WC Fransisca Kaufmann, NP   500 mg at 11/17/12 0750  . sertraline (ZOLOFT) tablet 100 mg  100 mg Oral Daily Thresa Ross, MD   100 mg at 11/17/12 0750  . traZODone (DESYREL) tablet 50 mg  50 mg Oral QHS,MR X 1 Kerry Hough, PA-C   50 mg at 11/16/12 2141    Lab Results:  Results for orders placed during the hospital encounter of 11/11/12 (from the past 48 hour(s))  GLUCOSE, CAPILLARY     Status: None   Collection Time     11/15/12  5:10 PM      Result Value Range   Glucose-Capillary 95  70 - 99 mg/dL   Comment 1 Documented in Chart     Comment 2 Notify RN    GLUCOSE, CAPILLARY     Status: None   Collection Time    11/16/12 11:54 AM      Result Value Range   Glucose-Capillary 99  70 - 99 mg/dL  GLUCOSE, CAPILLARY     Status: Abnormal   Collection Time    11/17/12  6:24 AM      Result Value Range   Glucose-Capillary 115 (*) 70 - 99 mg/dL    Physical Findings: AIMS: Facial and Oral Movements Muscles of Facial Expression: None, normal Lips and Perioral Area: None, normal Jaw: None, normal Tongue: None, normal,Extremity Movements Upper (arms, wrists, hands, fingers): None, normal Lower (legs, knees, ankles, toes): None, normal, Trunk Movements Neck, shoulders, hips: None, normal, Overall Severity Severity of abnormal movements (highest score from questions above): None, normal Incapacitation due to abnormal movements: None, normal Patient's awareness of abnormal movements (rate only patient's report): No Awareness,    CIWA:    COWS:     Treatment Plan Summary: Daily contact with patient to assess and evaluate symptoms and progress in treatment Medication management  Plan: Continue crisis management and stabilization.  Medication management: Continue Cogentin 1 mg at qhs for EPS prevention. Continue Prolixin 7.5 mg bid for psychosis.  Continue Zoloft 100 mg daily for depressive symptoms.  Continue Lisinopril 5 mg daily. Consult with Dr. Dub Mikes for second opinion for forced medications as the patient is not completely medication compliant. Staff are performing mouth checks to ensure increased medication compliance. Case manager to contact wife to come in to meet with the treatment team. Place on wait list for [facility] if patient does not begin to show improvement.  Address health issues: Vitals reviewed and stable. Patient needing encouragement to shower and tend to hygiene needs. Started on Metformin 500  mg BID for A1C of 6.2. CBG checks ordered TID.  Medical Decision Making Problem Points:  Established problem, improving Data Points:  Review of medication regiment & side effects (2) Review of new medications or change in dosage (2)  I certify that inpatient services furnished can reasonably be expected to improve the patient's condition.   Fransisca Kaufmann, NP-C 11/17/2012, 11:06 AM

## 2012-11-17 NOTE — Progress Notes (Signed)
Seen and agreed. Guadalupe Nickless, MD 

## 2012-11-17 NOTE — [facility] Group Notes (Signed)
[facility] Group Therapy  11/17/2012 , 1:33 PM    Type of Therapy:  [employer] Presentation  Participation Level:  Did not attend    Summary of Progress/Problems:  Onalee Hua from [employer] came to present his recovery story and play the guitar.    Daryel Gerald B 11/17/2012 , 1:33 PM

## 2012-11-17 NOTE — Tx Team (Signed)
  Interdisciplinary Treatment Plan Update   Date Reviewed:  11/17/2012  Time Reviewed:  9:12 AM  Progress in Treatment:   Attending groups: Yes Participating in groups: Yes Taking medication as prescribed: Yes  Tolerating medication: Yes Family/Significant other contact made: Yes  Patient understands diagnosis: Yes  Discussing patient identified problems/goals with staff: Yes Medical problems stabilized or resolved: Yes Denies suicidal/homicidal ideation: Yes  In tx team Patient has not harmed self or others: Yes  For review of initial/current patient goals, please see plan of care.  Estimated Length of Stay:  4-5 days  Reason for Continuation of Hospitalization: Depression Hallucinations Medication stabilization  New Problems/Goals identified:  N/A  Discharge Plan or Barriers:   return home, follow up outpt  Additional Comments:  Yesterday, pt got out of bed, showered, but declined group attendance.  It was also discovered that he had been cheeking some meds as several pills were found in his bed.  Verbal communication is minimal.  Multiple staff reports of Stanley Watkins approaching them and stating that he wants to go is the extent of verbal communication.  Spouse came to visit on Tues nite.  Pt was told yesterday that if there was not change he would be sent to [facility].  Attendees:  Signature: Thedore Mins, MD 11/17/2012 9:12 AM   Signature: Richelle Ito, LCSW 11/17/2012 9:12 AM  Signature: Fransisca Kaufmann, NP 11/17/2012 9:12 AM  Signature: Joslyn Devon, RN 11/17/2012 9:12 AM  Signature: Liborio Nixon, RN 11/17/2012 9:12 AM  Signature:  11/17/2012 9:12 AM  Signature:   11/17/2012 9:12 AM  Signature:    Signature:    Signature:    Signature:    Signature:    Signature:      Scribe for Treatment Team:   Richelle Ito, LCSW  11/17/2012 9:12 AM

## 2012-11-17 NOTE — Progress Notes (Signed)
D: Pt presents with flat affect. Depressed mood. Pt cheeking meds. Writer found pills in pt bed. Pt selectively mute during assessment. Pt has poor hygiene/body odor. Pt continues to lay in bed. Avoids eye contact. A: Medications administered as ordered per MD. Writer encouraged pt to perform ADLs. Writer encouraged pt to verbalize feelings. Verbal support given. Pt encouraged to attend groups. 15 minute checks performed for safety. R: Pt noncompliant with treatment. Pt remains safe.

## 2012-11-17 NOTE — Progress Notes (Signed)
Patient ID: Stanley Watkins, male   DOB: [birth year], 62 y.o.   MRN: [id number]  D: Pt was laying in bed during the assessment. Initially, pt barely acknowledged that the writer was in the room. After apprx a min of talking and introduction, the writer mentioned that she was informed pt had been cheeking his meds. Pt looked up and smiled at the writer, but wouldn't say anything. Writer stated, "What are we gonna do with you?" Pt smiled and stated, "send me home". Writer reminded pt how important it is for him to follow dr's orders. Also reminded pt of importance of speaking to his SW and Dr.   Mervyn Skeeters:  Support and encouragement was offered. 15 min checks continued for safety.  R: Pt remains safe.

## 2012-11-17 NOTE — Progress Notes (Signed)
Adult Psychoeducational Group Note  Date: 11/17/2012  Time: 11:56 AM  Group Topic/Focus:  Personal Choices and Values: The focus of this group is to help patients assess and explore the importance of values in their lives, how their values affect their decisions, how they express their values and what opposes their expression.  Participation Level: Did Not Attend  Participation Quality:  Affect:  Cognitive:  Insight:  Engagement in Group:  Modes of Intervention:  Additional Comments: Pt was in the bed sleeping, pt refused to attend.  Stanley Watkins  11/17/2012, 11:56 AM  

## 2012-11-17 NOTE — [facility] Group Notes (Signed)
[facility] LCSW Aftercare Discharge Planning Group Note   11/17/2012 11:10 AM  Participation Quality:  Did not attend      [place]

## 2012-11-17 NOTE — Progress Notes (Signed)
Recreation Therapy Notes  Date: 09.10.2014 Time: 9:30am Location: [address] dayroom  Group Topic: Leisure Education  Goal Area(s) Addresses:  Patient will verbalize activity of interest by end of group session. Patient will verbalize the ability to use positive leisure/recreation as a coping mechanism.  Behavioral Response: Did not attend.  Marykay Lex Garyn Arlotta, LRT/CTRS  Jearl Klinefelter 11/17/2012 7:27 PM

## 2012-11-18 LAB — GLUCOSE, CAPILLARY: Glucose-Capillary: 111 mg/dL — ABNORMAL HIGH (ref 70–99)

## 2012-11-18 MED ORDER — SERTRALINE HCL 50 MG PO TABS
150.0000 mg | ORAL_TABLET | Freq: Every day | ORAL | Status: DC
  Administered 2012-11-20 – 2012-11-23 (×4): 150 mg via ORAL
  Filled 2012-11-18 (×7): qty 1

## 2012-11-18 MED ORDER — TRAZODONE HCL 100 MG PO TABS
100.0000 mg | ORAL_TABLET | Freq: Every evening | ORAL | Status: DC | PRN
  Administered 2012-11-20 – 2012-11-22 (×3): 100 mg via ORAL
  Filled 2012-11-18 (×2): qty 1
  Filled 2012-11-18: qty 28
  Filled 2012-11-18 (×4): qty 1
  Filled 2012-11-18: qty 28
  Filled 2012-11-18 (×6): qty 1

## 2012-11-18 MED ORDER — FLUPHENAZINE HCL 5 MG PO TABS
10.0000 mg | ORAL_TABLET | Freq: Two times a day (BID) | ORAL | Status: DC
  Administered 2012-11-18 – 2012-11-23 (×8): 10 mg via ORAL
  Filled 2012-11-18: qty 56
  Filled 2012-11-18 (×8): qty 1
  Filled 2012-11-18: qty 56
  Filled 2012-11-18 (×4): qty 1

## 2012-11-18 NOTE — Progress Notes (Signed)
Patient ID: Stanley Watkins, male   DOB: 21-Apr-1950, 62 y.o.   MRN: [id number]  [facility] MD Progress Note  11/18/2012 10:19 AM Stanley Watkins  MRN:  [id number] Subjective: " all I want is to go home, nothing is wrong with me." Objective:  Patient remains depressed, socially withdrawn with minimal interaction with staffs and peers. He is showing lack of motivation, low energy level and still refuse to volunteer information regarding his mental illness. He has poor grooming and hygiene. He has started talking a little but the only statement he uttered today is just; "I want to go home." Patient is compliant with her medications and has not endorsed any adverse reactions. He has not made any significant improvement.  Diagnosis:   DSM5: Axis I: Schizoaffective Disorder Axis II: Deferred Axis III:  Past Medical History  Diagnosis Date  . Diabetes mellitus without complication     pre diabetes  . Hypertension     non complaint   Axis IV: occupational problems, other psychosocial or environmental problems and problems with primary support group Axis V: 41-50 serious symptoms  ADL's:  Impaired  Sleep: Good  Appetite:  Fair  Suicidal Ideation:  Patient would not answer Homicidal Ideation:  Patient would not answer AEB (as evidenced by):  Psychiatric Specialty Exam: Review of Systems  Constitutional: Negative.   HENT: Negative.   Eyes: Negative.   Respiratory: Negative.   Cardiovascular: Negative.   Gastrointestinal: Negative.   Genitourinary: Negative.   Musculoskeletal: Negative.   Skin: Negative.   Neurological: Negative.   Endo/Heme/Allergies: Negative.   Psychiatric/Behavioral: Positive for depression. The patient is nervous/anxious and has insomnia.     Blood pressure 141/99, pulse 85, temperature 98.5 F (36.9 C), temperature source Oral, resp. rate 18, height 5' 8.9" (1.75 m), weight 105.235 kg (232 lb).Body mass index is 34.36 kg/(m^2).  General Appearance:  Disheveled  Eye Contact::  Poor  Speech:  Poverty of speech  Volume:  decreased  Mood:  Depressed  Affect:  Flat  Thought Process:  disorganized  Orientation: to place and person only  Thought Content:  paranoid  Suicidal Thoughts: denies  Homicidal Thoughts:  denies  Memory: poor  Judgement:  Poor  Insight:  Lacking  Psychomotor Activity:  Decreased  Concentration:  Poor  Recall:  poor  Akathisia:  No  Handed:  Right  AIMS (if indicated):     Assets:  Intimacy Leisure Time Physical Health Resilience  Sleep:  Number of Hours: 1.75   Current Medications: Current Facility-Administered Medications  Medication Dose Route Frequency Provider Last Rate Last Dose  . acetaminophen (TYLENOL) tablet 650 mg  650 mg Oral Q6H PRN Kerry Hough, PA-C      . alum & mag hydroxide-simeth (MAALOX/MYLANTA) 200-200-20 MG/5ML suspension 30 mL  30 mL Oral Q4H PRN Kerry Hough, PA-C      . benztropine (COGENTIN) tablet 1 mg  1 mg Oral QHS Macee Venables   1 mg at 11/17/12 2247  . fluPHENAZine (PROLIXIN) tablet 10 mg  10 mg Oral BID Gracen Southwell      . lisinopril (PRINIVIL,ZESTRIL) tablet 5 mg  5 mg Oral Daily Fransisca Kaufmann, NP   5 mg at 11/18/12 0804  . LORazepam (ATIVAN) tablet 1 mg  1 mg Oral BID Fransisca Kaufmann, NP   1 mg at 11/18/12 0804   Or  . LORazepam (ATIVAN) injection 1 mg  1 mg Intramuscular BID Fransisca Kaufmann, NP   1 mg at 11/11/12 1701  .  magnesium hydroxide (MILK OF MAGNESIA) suspension 30 mL  30 mL Oral Daily PRN Kerry Hough, PA-C      . metFORMIN (GLUCOPHAGE) tablet 500 mg  500 mg Oral BID WC Fransisca Kaufmann, NP   500 mg at 11/18/12 0805  . [START ON 11/19/2012] sertraline (ZOLOFT) tablet 150 mg  150 mg Oral Daily Harika Laidlaw      . traZODone (DESYREL) tablet 100 mg  100 mg Oral QHS,MR X 1 Brinnley Lacap        Lab Results:  Results for orders placed during the hospital encounter of 11/11/12 (from the past 48 hour(s))  GLUCOSE, CAPILLARY     Status: None   Collection Time     11/16/12 11:54 AM      Result Value Range   Glucose-Capillary 99  70 - 99 mg/dL  GLUCOSE, CAPILLARY     Status: Abnormal   Collection Time    11/17/12  6:24 AM      Result Value Range   Glucose-Capillary 115 (*) 70 - 99 mg/dL  GLUCOSE, CAPILLARY     Status: Abnormal   Collection Time    11/18/12  7:25 AM      Result Value Range   Glucose-Capillary 111 (*) 70 - 99 mg/dL    Physical Findings: AIMS: Facial and Oral Movements Muscles of Facial Expression: None, normal Lips and Perioral Area: None, normal Jaw: None, normal Tongue: None, normal,Extremity Movements Upper (arms, wrists, hands, fingers): None, normal Lower (legs, knees, ankles, toes): None, normal, Trunk Movements Neck, shoulders, hips: None, normal, Overall Severity Severity of abnormal movements (highest score from questions above): None, normal Incapacitation due to abnormal movements: None, normal Patient's awareness of abnormal movements (rate only patient's report): No Awareness,    CIWA:    COWS:     Treatment Plan Summary: Daily contact with patient to assess and evaluate symptoms and progress in treatment Medication management  Plan: Continue crisis management and stabilization.  Medication management: Continue Cogentin 1 mg at qhs for EPS prevention. Increase Prolixin to 10mg  bid for psychosis and delusions  Increase Zoloft to 150 mg daily for depressive symptoms.  Continue Lisinopril 5 mg daily.  Patient on the [facility] waiting list due to lack of improvement of symptoms.  Address health issues: Vitals reviewed and stable. Patient needing encouragement to shower and tend to hygiene needs.  Continue  Metformin 500 mg BID for A1C of 6.2. CBG checks ordered TID.  Medical Decision Making Problem Points:  Established problem, slowly improving Data Points:  Review of medication regiment & side effects (2) Review of new medications or change in dosage (2)  I certify that inpatient services furnished can  reasonably be expected to improve the patient's condition.   Thedore Mins, MD 11/18/2012, 10:19 AM

## 2012-11-18 NOTE — [facility] Group Notes (Signed)
[facility] Group Notes:  (Counselor/Nursing/MHT/Case Management/Adjunct)  11/18/2012 1:15PM  Type of Therapy:  Group Therapy  Participation Level:  Did not attend.  After group found pt dressed, standing in room doorway.  He engaged in conversation related to wanting to go home.  Refused to sign release of information for outpt services.    Summary of Progress/Problems: The topic for group was balance in life.  Pt participated in the discussion about when their life was in balance and out of balance and how this feels.  Pt discussed ways to get back in balance and short term goals they can work on to get where they want to be.    Daryel Gerald B 11/18/2012 1:09 PM

## 2012-11-18 NOTE — Progress Notes (Signed)
Patient ID: Stanley Watkins, male   DOB: 1950/05/05, 62 y.o.   MRN: [id number]  D: Pt denies SI/HI/AVH . Pt is pleasant and cooperative. Approached pt and pt was not responsive after 2 minutes of silence and numerous questions pt stated " talk about what, I'll tell you what, I want something to eat". Pt was informed that he could get something to eat at [facility].   A: Pt was offered support and encouragement.  Pt was encourage to attend groups. Q 15 minute checks were done for safety.  R:  Pt has no complaints at this time.Pt receptive to treatment and safety maintained on unit.

## 2012-11-18 NOTE — Progress Notes (Signed)
Tried to give pt medication then pt began to snore very loud when writer was in the room . Writer stated i know you are not sleep, then pt stopped snoring , but continued to seem like he was asleep. Pt did not respond when medications were offered numerous times after.

## 2012-11-18 NOTE — Progress Notes (Signed)
D: Pt presents with flat affect. Depressed mood. Minimal interaction with others. Pt isolates in his room. When asked questions during shift assessment pt stated, "I want to go home, I have nothing to talk about". Pt is uncooperative with staff. Pt has poor insight.  Poor hygiene/body odor. A: Medications administered as ordered per MD. Verbal support given. 15 minute checks performed for safety. R: Pt remains safe at this time.

## 2012-11-19 NOTE — Progress Notes (Signed)
D: Pt refused to get out of bed initially to get medication. Pt not speaking to staff. It was reported to this RN that pt often cheeks his meds. Poor ADLs, pt had bad odor. A: Pt got dressed and walked to end of hall, staring out the window. Staff tried again to give pt am meds. Pt walked to his room, held out hands. Pt took meds as staff watched. When asked to open his mouth to check for cheeking, pt refused. Asked a MHT to watch pt while I attended to his room-mate. R: Within 15 minutes, pt had spit all meds in the toilet. MD notfied. Pt continues not to speak or attend groups. MD aware.

## 2012-11-19 NOTE — Progress Notes (Signed)
Refused dinnertime CBG

## 2012-11-19 NOTE — Progress Notes (Signed)
Adult Psychoeducational Group Note  Date:  11/19/2012 Time:  11:27 AM  Group Topic/Focus:  Early Warning Signs:   The focus of this group is to help patients identify signs or symptoms they exhibit before slipping into an unhealthy state or crisis.  Participation Level:  Did Not Attend  [name] Y 11/19/2012, 11:27 AM 

## 2012-11-19 NOTE — [facility] Group Notes (Signed)
[facility] LCSW Aftercare Discharge Planning Group Note   11/19/2012 7:56 AM  Participation Quality:  Did not attend.    Stanley Watkins B   

## 2012-11-19 NOTE — Progress Notes (Signed)
CBG - 94 at lunchtime

## 2012-11-19 NOTE — Progress Notes (Signed)
Patient ID: Stanley Watkins, male   DOB: November 26, 1950, 62 y.o.   MRN: [id number]  [facility] MD Progress Note  11/19/2012 11:55 AM Stanley Watkins  MRN:  [id number] Subjective: "I'm good." Patient would not respond to other questions posed by writer today.  Objective:  Patient remains depressed, socially withdrawn with minimal interaction with staffs and peers. He is showing lack of motivation, low energy level and still refuse to volunteer information regarding his mental illness. He has poor grooming and hygiene. Patient observed lying in his bed this morning staring at the ceiling. He would not make eye contact with Clinical research associate. His room over the past few days was noted by staff to smell very poorly. Nursing staff reported today that patient spit his medications in the toilet. Patient remains noncompliant with providing information about his mental illness and with his basic care.  Diagnosis:   DSM5: Axis I: Schizoaffective Disorder Axis II: Deferred Axis III:  Past Medical History  Diagnosis Date  . Diabetes mellitus without complication     pre diabetes  . Hypertension     non complaint   Axis IV: occupational problems, other psychosocial or environmental problems and problems with primary support group Axis V: 41-50 serious symptoms  ADL's:  Impaired  Sleep: Good  Appetite:  Fair  Suicidal Ideation:  Patient would not answer Homicidal Ideation:  Patient would not answer AEB (as evidenced by):  Psychiatric Specialty Exam: Review of Systems  Constitutional: Negative.   HENT: Negative.   Eyes: Negative.   Respiratory: Negative.   Cardiovascular: Negative.   Gastrointestinal: Negative.   Genitourinary: Negative.   Musculoskeletal: Negative.   Skin: Negative.   Neurological: Negative.   Endo/Heme/Allergies: Negative.   Psychiatric/Behavioral: Positive for depression. Negative for suicidal ideas, hallucinations, memory loss and substance abuse. The patient is  nervous/anxious and has insomnia.     Blood pressure 125/86, pulse 82, temperature 97.9 F (36.6 C), temperature source Oral, resp. rate 16, height 5' 8.9" (1.75 m), weight 105.235 kg (232 lb).Body mass index is 34.36 kg/(m^2).  General Appearance: Disheveled  Eye Contact::  Poor  Speech:  Poverty of speech  Volume:  decreased  Mood:  Depressed  Affect:  Flat  Thought Process:  disorganized  Orientation: to place and person only  Thought Content:  paranoid  Suicidal Thoughts: denies  Homicidal Thoughts:  denies  Memory: poor  Judgement:  Poor  Insight:  Lacking  Psychomotor Activity:  Decreased  Concentration:  Poor  Recall:  poor  Akathisia:  No  Handed:  Right  AIMS (if indicated):     Assets:  Intimacy Leisure Time Physical Health Resilience  Sleep:  Number of Hours: 6.25   Current Medications: Current Facility-Administered Medications  Medication Dose Route Frequency Provider Last Rate Last Dose  . acetaminophen (TYLENOL) tablet 650 mg  650 mg Oral Q6H PRN Kerry Hough, PA-C      . alum & mag hydroxide-simeth (MAALOX/MYLANTA) 200-200-20 MG/5ML suspension 30 mL  30 mL Oral Q4H PRN Kerry Hough, PA-C      . benztropine (COGENTIN) tablet 1 mg  1 mg Oral QHS Mojeed Akintayo   1 mg at 11/17/12 2247  . fluPHENAZine (PROLIXIN) tablet 10 mg  10 mg Oral BID Mojeed Akintayo   10 mg at 11/18/12 1653  . lisinopril (PRINIVIL,ZESTRIL) tablet 5 mg  5 mg Oral Daily Fransisca Kaufmann, NP   5 mg at 11/18/12 0804  . LORazepam (ATIVAN) tablet 1 mg  1 mg Oral BID Vernona Rieger  Earlene Plater, NP   1 mg at 11/18/12 1653   Or  . LORazepam (ATIVAN) injection 1 mg  1 mg Intramuscular BID Fransisca Kaufmann, NP   1 mg at 11/11/12 1701  . magnesium hydroxide (MILK OF MAGNESIA) suspension 30 mL  30 mL Oral Daily PRN Kerry Hough, PA-C      . metFORMIN (GLUCOPHAGE) tablet 500 mg  500 mg Oral BID WC Fransisca Kaufmann, NP   500 mg at 11/18/12 1653  . sertraline (ZOLOFT) tablet 150 mg  150 mg Oral Daily Mojeed Akintayo      .  traZODone (DESYREL) tablet 100 mg  100 mg Oral QHS,MR X 1 Mojeed Akintayo        Lab Results:  Results for orders placed during the hospital encounter of 11/11/12 (from the past 48 hour(s))  GLUCOSE, CAPILLARY     Status: Abnormal   Collection Time    11/18/12  7:25 AM      Result Value Range   Glucose-Capillary 111 (*) 70 - 99 mg/dL  GLUCOSE, CAPILLARY     Status: Abnormal   Collection Time    11/18/12  6:28 PM      Result Value Range   Glucose-Capillary 105 (*) 70 - 99 mg/dL   Comment 1 Documented in Chart     Comment 2 Notify RN      Physical Findings: AIMS: Facial and Oral Movements Muscles of Facial Expression: None, normal Lips and Perioral Area: None, normal Jaw: None, normal Tongue: None, normal,Extremity Movements Upper (arms, wrists, hands, fingers): None, normal Lower (legs, knees, ankles, toes): None, normal, Trunk Movements Neck, shoulders, hips: None, normal, Overall Severity Severity of abnormal movements (highest score from questions above): None, normal Incapacitation due to abnormal movements: None, normal Patient's awareness of abnormal movements (rate only patient's report): No Awareness, Dental Status Current problems with teeth and/or dentures?: No Does patient usually wear dentures?: No  CIWA:    COWS:     Treatment Plan Summary: Daily contact with patient to assess and evaluate symptoms and progress in treatment Medication management  Plan: Continue crisis management and stabilization.  Medication management: Continue Cogentin 1 mg at qhs for EPS prevention. Continue Prolixin to 10mg  bid for psychosis and delusions  Continue Zoloft to 150 mg daily for depressive symptoms.  Continue Lisinopril 5 mg daily.  Patient on the [facility] waiting list due to lack of improvement of symptoms. Will monitor for continued medication refusals and will initiate forced medications if necessary as second opinion is in place from Dr. Dub Mikes.  Address health issues: Vitals  reviewed and stable. Patient needing encouragement to shower and tend to hygiene needs.  Continue  Metformin 500 mg BID for A1C of 6.2. CBG checks ordered TID.  Medical Decision Making Problem Points:  Established problem, slowly improving Data Points:  Review of medication regiment & side effects (2) Review of new medications or change in dosage (2)  I certify that inpatient services furnished can reasonably be expected to improve the patient's condition.   Fransisca Kaufmann, NP-C 11/19/2012, 11:55 AM

## 2012-11-19 NOTE — Progress Notes (Signed)
D. Pt has been in room and in bed for most of the evening. Pt has chosen not to participate in any milieu activities and has refused medications today. Pt has remained non-verbal for most of the day. Pt was seen talking on the phone and has been in the hallway from time-to-time but does not engage peers or staff. Pt seen freely eating and drinking and does not appear to be in any acute distress. A. Support and encouragement provided. R. Safety has been maintained.

## 2012-11-19 NOTE — [facility] Group Notes (Signed)
[facility] LCSW Group Therapy  11/19/2012  1:05 PM  Type of Therapy:  Group therapy  Participation Level:  Did not attend    Summary of Progress/Problems:  Chaplain was here to lead a group on themes of hope and courage.  Daryel Gerald B 11/19/2012 11:30 AM

## 2012-11-20 LAB — GLUCOSE, CAPILLARY
Glucose-Capillary: 98 mg/dL (ref 70–99)
Glucose-Capillary: 97 mg/dL (ref 70–99)
Glucose-Capillary: 96 mg/dL (ref 70–99)
Glucose-Capillary: 96 mg/dL (ref 70–99)

## 2012-11-20 NOTE — Progress Notes (Signed)
Patient ID: Stanley Watkins, male   DOB: 03-Apr-1950, 62 y.o.   MRN: [id number]  [facility] MD Progress Note  11/20/2012 2:45 PM Stanley Watkins  MRN:  [id number] Subjective: Patient would not answer any questions posed to him by writer.  Objective:  Patient remains depressed, socially withdrawn with minimal interaction with staffs and peers. The patient observed lying in bed still not consistently responding to staff. He is observed blinking his eyes. Patient was compliant with his medications this morning with encouragement from nursing staff.   Diagnosis:   DSM5: Axis I: Schizoaffective Disorder Axis II: Deferred Axis III:  Past Medical History  Diagnosis Date  . Diabetes mellitus without complication     pre diabetes  . Hypertension     non complaint   Axis IV: occupational problems, other psychosocial or environmental problems and problems with primary support group Axis V: 41-50 serious symptoms  ADL's:  Impaired  Sleep: Good  Appetite:  Fair  Suicidal Ideation:  Patient would not answer Homicidal Ideation:  Patient would not answer AEB (as evidenced by):  Psychiatric Specialty Exam: Review of Systems  Constitutional: Negative.   HENT: Negative.   Eyes: Negative.   Respiratory: Negative.   Cardiovascular: Negative.   Gastrointestinal: Negative.   Genitourinary: Negative.   Musculoskeletal: Negative.   Skin: Negative.   Neurological: Negative.   Endo/Heme/Allergies: Negative.   Psychiatric/Behavioral: Positive for depression. Negative for suicidal ideas, hallucinations, memory loss and substance abuse. The patient is nervous/anxious and has insomnia.     Blood pressure 147/102, pulse 88, temperature 98.4 F (36.9 C), temperature source Oral, resp. rate 17, height 5' 8.9" (1.75 m), weight 105.235 kg (232 lb).Body mass index is 34.36 kg/(m^2).  General Appearance: Disheveled  Eye Contact::  Poor  Speech:  Poverty of speech  Volume:  decreased  Mood:   Depressed  Affect:  Flat  Thought Process:  disorganized  Orientation: to place and person only  Thought Content:  paranoid  Suicidal Thoughts: denies  Homicidal Thoughts:  denies  Memory: poor  Judgement:  Poor  Insight:  Lacking  Psychomotor Activity:  Decreased  Concentration:  Poor  Recall:  poor  Akathisia:  No  Handed:  Right  AIMS (if indicated):     Assets:  Intimacy Leisure Time Physical Health Resilience  Sleep:  Number of Hours: 5   Current Medications: Current Facility-Administered Medications  Medication Dose Route Frequency Provider Last Rate Last Dose  . acetaminophen (TYLENOL) tablet 650 mg  650 mg Oral Q6H PRN Kerry Hough, PA-C      . alum & mag hydroxide-simeth (MAALOX/MYLANTA) 200-200-20 MG/5ML suspension 30 mL  30 mL Oral Q4H PRN Kerry Hough, PA-C      . benztropine (COGENTIN) tablet 1 mg  1 mg Oral QHS Mojeed Akintayo   1 mg at 11/17/12 2247  . fluPHENAZine (PROLIXIN) tablet 10 mg  10 mg Oral BID Mojeed Akintayo   10 mg at 11/20/12 0828  . lisinopril (PRINIVIL,ZESTRIL) tablet 5 mg  5 mg Oral Daily Fransisca Kaufmann, NP   5 mg at 11/20/12 8657  . LORazepam (ATIVAN) tablet 1 mg  1 mg Oral BID Fransisca Kaufmann, NP   1 mg at 11/20/12 0850   Or  . LORazepam (ATIVAN) injection 1 mg  1 mg Intramuscular BID Fransisca Kaufmann, NP   1 mg at 11/11/12 1701  . magnesium hydroxide (MILK OF MAGNESIA) suspension 30 mL  30 mL Oral Daily PRN Kerry Hough, PA-C      .  metFORMIN (GLUCOPHAGE) tablet 500 mg  500 mg Oral BID WC Fransisca Kaufmann, NP   500 mg at 11/20/12 4098  . sertraline (ZOLOFT) tablet 150 mg  150 mg Oral Daily Mojeed Akintayo   150 mg at 11/20/12 0850  . traZODone (DESYREL) tablet 100 mg  100 mg Oral QHS,MR X 1 Mojeed Akintayo        Lab Results:  Results for orders placed during the hospital encounter of 11/11/12 (from the past 48 hour(s))  GLUCOSE, CAPILLARY     Status: Abnormal   Collection Time    11/18/12  6:28 PM      Result Value Range   Glucose-Capillary 105  (*) 70 - 99 mg/dL   Comment 1 Documented in Chart     Comment 2 Notify RN    GLUCOSE, CAPILLARY     Status: None   Collection Time    11/18/12  9:47 PM      Result Value Range   Glucose-Capillary 98  70 - 99 mg/dL  GLUCOSE, CAPILLARY     Status: None   Collection Time    11/19/12  6:41 AM      Result Value Range   Glucose-Capillary 97  70 - 99 mg/dL  GLUCOSE, CAPILLARY     Status: None   Collection Time    11/19/12 11:50 AM      Result Value Range   Glucose-Capillary 94  70 - 99 mg/dL   Comment 1 Notify RN    GLUCOSE, CAPILLARY     Status: None   Collection Time    11/20/12  6:41 AM      Result Value Range   Glucose-Capillary 97  70 - 99 mg/dL   Comment 1 Notify RN    GLUCOSE, CAPILLARY     Status: None   Collection Time    11/20/12 11:52 AM      Result Value Range   Glucose-Capillary 96  70 - 99 mg/dL    Physical Findings: AIMS: Facial and Oral Movements Muscles of Facial Expression: None, normal Lips and Perioral Area: None, normal Jaw: None, normal Tongue: None, normal,Extremity Movements Upper (arms, wrists, hands, fingers): None, normal Lower (legs, knees, ankles, toes): None, normal, Trunk Movements Neck, shoulders, hips: None, normal, Overall Severity Severity of abnormal movements (highest score from questions above): None, normal Incapacitation due to abnormal movements: None, normal Patient's awareness of abnormal movements (rate only patient's report): No Awareness, Dental Status Current problems with teeth and/or dentures?: No Does patient usually wear dentures?: No  CIWA:    COWS:     Treatment Plan Summary: Daily contact with patient to assess and evaluate symptoms and progress in treatment Medication management  Plan: Continue crisis management and stabilization.  Medication management: Continue Cogentin 1 mg at qhs for EPS prevention. Continue Prolixin to 10mg  bid for psychosis and delusions  Continue Zoloft to 150 mg daily for depressive  symptoms.  Continue Lisinopril 5 mg daily.  Patient on the [facility] waiting list due to lack of improvement of symptoms. Will monitor for continued medication refusals and will initiate forced medications if necessary as second opinion is in place from Dr. Dub Mikes.  Address health issues: Vitals reviewed and stable. Patient needing encouragement to shower and tend to hygiene needs.  Continue  Metformin 500 mg BID for A1C of 6.2. CBG checks ordered TID. CBG's monitored as ordered and running in the 90's.   Medical Decision Making Problem Points:  Established problem, slowly improving Data Points:  Review  of medication regiment & side effects (2) Review of new medications or change in dosage (2)  I certify that inpatient services furnished can reasonably be expected to improve the patient's condition.   Fransisca Kaufmann, NP-C 11/20/2012, 2:44 PM  Reviewed note, agree with findings and plan.  Jacqulyn Cane, M.D.  11/20/2012 11:00 PM

## 2012-11-20 NOTE — [facility] Group Notes (Signed)
[facility] Group Notes: (Clinical Social Work)   11/20/2012      Type of Therapy:  Group Therapy   Participation Level:  Did Not Attend    Ambrose Mantle, LCSW 11/20/2012, 12:43 PM

## 2012-11-20 NOTE — Progress Notes (Signed)
Pt in bed all evening. Will not make eye contact with this writer or respond verbally to any questions. Assisted with taking meds. Offered snack but pt did not respond. Pt remains profoundly depressed, withdrawn and isolated. Will continue to monitor closely. Lawrence Marseilles

## 2012-11-20 NOTE — Progress Notes (Signed)
D. Pt has been in room and in bed for much of the day, very minimal interaction or participation in milieu. Pt remains mostly non-verbal but able to talk when prompted. Pt did receive medications today with much encouragement and support from  Pt has not verbalized any complaints today and does not appear to be in any acute distress. A. Support and encouragement provided. R. Safety maintained.

## 2012-11-20 NOTE — Progress Notes (Signed)
Patient ID: [name], male   DOB: May 17, 1950, 62 y.o.   MRN: [id number] Psychoeducational Group Note  Date:  11/20/2012 Time:0930am  Group Topic/Focus:  Identifying Needs:   The focus of this group is to help patients identify their personal needs that have been historically problematic and identify healthy behaviors to address their needs.  Participation Level:  Did Not Attend  Participation Quality:    Affect:  Cognitive:  Insight:   Engagement in Group:  Additional Comments:  Inventory and Psychoeducational group   Valente David 11/20/2012,10:07 AM

## 2012-11-21 NOTE — [facility] Group Notes (Signed)
[facility] Group Notes: (Clinical Social Work)   11/21/2012      Type of Therapy:  Group Therapy   Participation Level:  Did Not Attend    Ambrose Mantle, LCSW 11/21/2012, 1:07 PM

## 2012-11-21 NOTE — [facility] Group Notes (Signed)
[facility] Group Notes:  (Nursing/MHT/Case Management/Adjunct)  Date:  11/21/2012  Time:  10:52 AM  Type of Therapy:  Nurse Education  Participation Level:  Did Not Attend  Participation Quality:    Affect:    Cognitive:    Insight:    Engagement in Group:    Modes of Intervention:    Summary of Progress/Problems:  Stanley Watkins 11/21/2012, 10:52 AM

## 2012-11-21 NOTE — Progress Notes (Signed)
Patient ID: Stanley Watkins, male   DOB: [birth year], 62 y.o.   MRN: [id number]  [facility] MD Progress Note  11/21/2012 2:25 PM Stanley Watkins  MRN:  [id number] Subjective: Patient continues to not respond to questions posed by writer. He made a hand gesture but Clinical research associate was unsure what it meant.  Objective:  Patient remains depressed, socially withdrawn with minimal interaction with staffs and peers. The patient observed lying in bed still not consistently responding to staff. He is observed blinking his eyes. Patient continues to be compliant with his medications  with encouragement from nursing staff.   Diagnosis:   DSM5: Axis I: Schizoaffective Disorder Axis II: Deferred Axis III:  Past Medical History  Diagnosis Date  . Diabetes mellitus without complication     pre diabetes  . Hypertension     non complaint   Axis IV: occupational problems, other psychosocial or environmental problems and problems with primary support group Axis V: 41-50 serious symptoms  ADL's:  Impaired  Sleep: Good  Appetite:  Fair  Suicidal Ideation:  Patient would not answer Homicidal Ideation:  Patient would not answer AEB (as evidenced by):  Psychiatric Specialty Exam: Review of Systems  Constitutional: Negative.   HENT: Negative.   Eyes: Negative.   Respiratory: Negative.   Cardiovascular: Negative.   Gastrointestinal: Negative.   Genitourinary: Negative.   Musculoskeletal: Negative.   Skin: Negative.   Neurological: Negative.   Endo/Heme/Allergies: Negative.   Psychiatric/Behavioral: Positive for depression. Negative for suicidal ideas, hallucinations, memory loss and substance abuse. The patient is nervous/anxious and has insomnia.     Blood pressure 118/75, pulse 89, temperature 98.4 F (36.9 C), temperature source Oral, resp. rate 20, height 5' 8.9" (1.75 m), weight 105.235 kg (232 lb).Body mass index is 34.36 kg/(m^2).  General Appearance: Disheveled  Eye Contact::  Poor   Speech:  Poverty of speech  Volume:  decreased  Mood:  Depressed  Affect:  Flat  Thought Process:  disorganized  Orientation: to place and person only  Thought Content:  paranoid  Suicidal Thoughts: denies  Homicidal Thoughts:  denies  Memory: poor  Judgement:  Poor  Insight:  Lacking  Psychomotor Activity:  Decreased  Concentration:  Poor  Recall:  poor  Akathisia:  No  Handed:  Right  AIMS (if indicated):     Assets:  Intimacy Leisure Time Physical Health Resilience  Sleep:  Number of Hours: 6.75   Current Medications: Current Facility-Administered Medications  Medication Dose Route Frequency Provider Last Rate Last Dose  . acetaminophen (TYLENOL) tablet 650 mg  650 mg Oral Q6H PRN Kerry Hough, PA-C      . alum & mag hydroxide-simeth (MAALOX/MYLANTA) 200-200-20 MG/5ML suspension 30 mL  30 mL Oral Q4H PRN Kerry Hough, PA-C      . benztropine (COGENTIN) tablet 1 mg  1 mg Oral QHS Mojeed Akintayo   1 mg at 11/20/12 2131  . fluPHENAZine (PROLIXIN) tablet 10 mg  10 mg Oral BID Mojeed Akintayo   10 mg at 11/21/12 0744  . lisinopril (PRINIVIL,ZESTRIL) tablet 5 mg  5 mg Oral Daily Fransisca Kaufmann, NP   5 mg at 11/21/12 0744  . LORazepam (ATIVAN) tablet 1 mg  1 mg Oral BID Fransisca Kaufmann, NP   1 mg at 11/21/12 0743   Or  . LORazepam (ATIVAN) injection 1 mg  1 mg Intramuscular BID Fransisca Kaufmann, NP   1 mg at 11/11/12 1701  . magnesium hydroxide (MILK OF MAGNESIA) suspension 30 mL  30 mL Oral Daily PRN Kerry Hough, PA-C      . metFORMIN (GLUCOPHAGE) tablet 500 mg  500 mg Oral BID WC Fransisca Kaufmann, NP   500 mg at 11/21/12 0744  . sertraline (ZOLOFT) tablet 150 mg  150 mg Oral Daily Mojeed Akintayo   150 mg at 11/21/12 0744  . traZODone (DESYREL) tablet 100 mg  100 mg Oral QHS,MR X 1 Mojeed Akintayo   100 mg at 11/20/12 2131    Lab Results:  Results for orders placed during the hospital encounter of 11/11/12 (from the past 48 hour(s))  GLUCOSE, CAPILLARY     Status: None    Collection Time    11/20/12  6:41 AM      Result Value Range   Glucose-Capillary 97  70 - 99 mg/dL   Comment 1 Notify RN    GLUCOSE, CAPILLARY     Status: None   Collection Time    11/20/12 11:52 AM      Result Value Range   Glucose-Capillary 96  70 - 99 mg/dL  GLUCOSE, CAPILLARY     Status: None   Collection Time    11/20/12  4:27 PM      Result Value Range   Glucose-Capillary 96  70 - 99 mg/dL    Physical Findings: AIMS: Facial and Oral Movements Muscles of Facial Expression: None, normal Lips and Perioral Area: None, normal Jaw: None, normal Tongue: None, normal,Extremity Movements Upper (arms, wrists, hands, fingers): None, normal Lower (legs, knees, ankles, toes): None, normal, Trunk Movements Neck, shoulders, hips: None, normal, Overall Severity Severity of abnormal movements (highest score from questions above): None, normal Incapacitation due to abnormal movements: None, normal Patient's awareness of abnormal movements (rate only patient's report): No Awareness, Dental Status Current problems with teeth and/or dentures?: No Does patient usually wear dentures?: No  CIWA:    COWS:     Treatment Plan Summary: Daily contact with patient to assess and evaluate symptoms and progress in treatment Medication management  Plan: Continue crisis management and stabilization.  Medication management: Continue Cogentin 1 mg at qhs for EPS prevention. Continue Prolixin to 10mg  bid for psychosis and delusions  Continue Zoloft to 150 mg daily for depressive symptoms.  Continue Lisinopril 5 mg daily.  Patient on the [facility] waiting list due to lack of improvement of symptoms. Will monitor for continued medication refusals and will initiate forced medications if necessary as second opinion is in place from Dr. Dub Mikes.  Address health issues: Vitals reviewed and stable. Patient needing encouragement to shower and tend to hygiene needs.  Continue  Metformin 500 mg BID for A1C of 6.2. CBG  checks ordered TID. CBG's monitored as ordered and running in the 90's.   Medical Decision Making Problem Points:  Established problem, slowly improving Data Points:  Review of medication regiment & side effects (2) Review of new medications or change in dosage (2)  I certify that inpatient services furnished can reasonably be expected to improve the patient's condition.   Fransisca Kaufmann, NP-C 11/21/2012, 2:25 PM  Reviewed note, agree with findings and plan.  Jacqulyn Cane, M.D.  11/22/2012 4:59 AM

## 2012-11-21 NOTE — Progress Notes (Signed)
Pt's status remains unchanged. He stays in bed and will not answer questions. Would only briefly open his eyes for this Clinical research associate. Took meds with sips of ginger ale. Level III obs in place for safety. UTA pt's SI/HI/AVH. He remains safe. Lawrence Marseilles

## 2012-11-21 NOTE — Progress Notes (Signed)
D- Patient is isolative and mute with no group attendance.  Compliant with medications. Taking fluids and eating meals.  UTA SI/HI/AVH.  A- Continue to encourage interaction.  Continue current POC and evaluation of treatment goals.  Continue 15' checks for safety.  R- Safety maintained.

## 2012-11-22 NOTE — Progress Notes (Signed)
D: Pt presents with flat affect. Minimal interaction. Selectively mute. Pt refused to communicate with writer during shift assessment this morning. Pt then opened up and spoke with MD during consult. Pt excited about going home tomorrow. Writer informed pt that he needs to complete his ADLs and participate in scheduled activities today. A: Medications administered as ordered per MD. Verbal support given. Pt encouraged to attend groups. 15 minute check performed for safety. R: Pt forwards little information. Pt refused to open mouth for writer to check and see if pt swallowed morning meds.

## 2012-11-22 NOTE — [facility] Counselor (Signed)
Adult Comprehensive Assessment  Patient ID: Stanley Watkins, male   DOB: April 11, 1950, 62 y.o.   MRN: [id number]  Information Source: Information source: Patient  Current Stressors:  Educational / Learning stressors: N/A Employment / Job issues: Yes  Unable to work since Feb. due to severe depression-went back one week  Family Relationships: Yes  Parents both died-he shares minimally with wife Surveyor, quantity / Lack of resources (include bankruptcy): Yes  Wife reports that is one of the stressors Housing / Lack of housing: N/A Physical health (include injuries & life threatening diseases): N/A Social relationships: N/A Substance abuse: N/A Bereavement / Loss: Father-three years ago was significant-also when mother was diagnosed with Alzeimers  Living/Environment/Situation:  Living conditions (as described by patient or guardian): comfortable How long has patient lived in current situation?: many years What is atmosphere in current home: Supportive;Comfortable  Family History:  Marital status: Married What types of issues is patient dealing with in the relationship?: mental heatlh issues-unwillingness/inability to communicate with spouse Does patient have children?: Yes  Childhood History:  By whom was/is the patient raised?: Both parents Description of patient's relationship with caregiver when they were a child: good Patient's description of current relationship with people who raised him/her: both deceased Does patient have siblings?: No Did patient suffer any verbal/emotional/physical/sexual abuse as a child?: No Did patient suffer from severe childhood neglect?: No Has patient ever been sexually abused/assaulted/raped as an adolescent or adult?: No Was the patient ever a victim of a crime or a disaster?: No Witnessed domestic violence?: No Has patient been effected by domestic violence as an adult?: No  Education:  Highest grade of school patient has completed:  12  Employment/Work Situation:   Employment situation: Employed 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, political party How long has patient been employed?: many years Patient's job has been impacted by current illness: Yes Describe how patient's job has been impacted: unable to work due to severe depression-has had 2 previous episodes when he was off of work for a month or so What is the longest time patient has a held a job?: see above Has patient ever been in the [employer]?: No Has patient ever served in combat?: No  Financial Resources:   Financial resources: Income from employment Does patient have a Lawyer or guardian?: No  Alcohol/Substance Abuse:   Alcohol/Substance Abuse Treatment Hx: Denies past history Has alcohol/substance abuse ever caused legal problems?: No  Social Support System:   Conservation officer, nature Support System: Good Describe Community Support System: family Type of faith/religion: Did not answer  Leisure/Recreation:   Leisure and Hobbies: Did not answer  Strengths/Needs:   What things does the patient do well?: Did not answer  Discharge Plan:   Does patient have access to transportation?: Yes Will patient be returning to same living situation after discharge?: Yes Currently receiving community mental health services: Yes (From Whom) ([facility]) Does patient have financial barriers related to discharge medications?: No  Summary/Recommendations:   Summary and Recommendations (to be completed by the evaluator): Stanley Watkins is a 62 YO AA male who has a history of significant mental illness and non-compliance with medications.  He has had 3 significant episodes in the past 3 years, all of which have resulted in time in the hospital and also time away from work.  His last two times resulted in a quicker recovery period.  He can benefit from crises stabilization, medication managment, therapeutic milieu and referral for services.  Stanley Watkins B. 11/22/2012

## 2012-11-22 NOTE — Progress Notes (Signed)
Recreation Therapy Notes   Date: 09.15.2014 Time: 9:30am Location: [address] Dayroom  Group Topic: Coping Skills  Goal Area(s) Addresses:  Patient will verbalize importance of recognizing emotions. Patient will identify at least one emotion. Patient will successfully represent varying emotions in pictures or words.   Behavioral Response:  Did not attend  Jearl Klinefelter, LRT/CTRS  Jearl Klinefelter 11/22/2012 12:34 PM

## 2012-11-22 NOTE — Progress Notes (Signed)
Pt is not allowing access to hands for staff to obtain CBG. Pt holds covers up to chin and will not release. Lawrence Marseilles

## 2012-11-22 NOTE — [facility] Group Notes (Signed)
[facility] LCSW Aftercare Discharge Planning Group Note   11/22/2012 8:02 AM  Participation Quality:  Did not attend    [place]

## 2012-11-22 NOTE — Progress Notes (Signed)
Patient ID: Stanley Watkins, male   DOB: 25-Jun-1950, 62 y.o.   MRN: [id number]  [facility] MD Progress Note  11/22/2012 9:57 AM Beacher Every Watkins  MRN:  [id number] Subjective: " I am ready to go home, I had a good weekend I guess."  Objective:  Patient reports decreased depressive symptoms, denies psychosis or delusional symptoms. However, he remains withdrawn, guarded with minimal participation in the unit activities. He is compliant with his medications and seems to be making a steady but significant improvement. He did not endorse any adverse reactions to his medications.   Diagnosis:   DSM5: Axis I: Schizoaffective Disorder Axis II: Deferred Axis III:  Past Medical History  Diagnosis Date  . Diabetes mellitus without complication     pre diabetes  . Hypertension     non complaint   Axis IV: occupational problems, other psychosocial or environmental problems and problems with primary support group Axis V: 50-60 moderate symptoms  ADL's:  Impaired  Sleep: Good  Appetite:  Fair  Suicidal Ideation:  Patient would not answer Homicidal Ideation:  Patient would not answer AEB (as evidenced by):  Psychiatric Specialty Exam: Review of Systems  Constitutional: Negative.   HENT: Negative.   Eyes: Negative.   Respiratory: Negative.   Cardiovascular: Negative.   Gastrointestinal: Negative.   Genitourinary: Negative.   Musculoskeletal: Negative.   Skin: Negative.   Neurological: Negative.   Endo/Heme/Allergies: Negative.   Psychiatric/Behavioral: Positive for depression. Negative for suicidal ideas, hallucinations, memory loss and substance abuse. The patient is nervous/anxious and has insomnia.     Blood pressure 118/75, pulse 89, temperature 98.4 F (36.9 C), temperature source Oral, resp. rate 20, height 5' 8.9" (1.75 m), weight 105.235 kg (232 lb).Body mass index is 34.36 kg/(m^2).  General Appearance: Disheveled  Eye Contact::  Poor  Speech:  Poverty of speech   Volume:  decreased  Mood:  Depressed  Affect:  Flat  Thought Process:  disorganized  Orientation: to place and person only  Thought Content:  paranoid  Suicidal Thoughts: denies  Homicidal Thoughts:  denies  Memory: poor  Judgement:  Poor  Insight:  Lacking  Psychomotor Activity:  Decreased  Concentration:  Poor  Recall:  poor  Akathisia:  No  Handed:  Right  AIMS (if indicated):     Assets:  Intimacy Leisure Time Physical Health Resilience  Sleep:  Number of Hours: 3.5   Current Medications: Current Facility-Administered Medications  Medication Dose Route Frequency Provider Last Rate Last Dose  . acetaminophen (TYLENOL) tablet 650 mg  650 mg Oral Q6H PRN Kerry Hough, PA-C      . alum & mag hydroxide-simeth (MAALOX/MYLANTA) 200-200-20 MG/5ML suspension 30 mL  30 mL Oral Q4H PRN Kerry Hough, PA-C      . benztropine (COGENTIN) tablet 1 mg  1 mg Oral QHS Chrissie Dacquisto   1 mg at 11/21/12 2144  . fluPHENAZine (PROLIXIN) tablet 10 mg  10 mg Oral BID Dorianne Perret   10 mg at 11/22/12 0813  . lisinopril (PRINIVIL,ZESTRIL) tablet 5 mg  5 mg Oral Daily Fransisca Kaufmann, NP   5 mg at 11/22/12 0272  . LORazepam (ATIVAN) tablet 1 mg  1 mg Oral BID Fransisca Kaufmann, NP   1 mg at 11/22/12 0813   Or  . LORazepam (ATIVAN) injection 1 mg  1 mg Intramuscular BID Fransisca Kaufmann, NP   1 mg at 11/11/12 1701  . magnesium hydroxide (MILK OF MAGNESIA) suspension 30 mL  30 mL Oral Daily  PRN Kerry Hough, PA-C      . metFORMIN (GLUCOPHAGE) tablet 500 mg  500 mg Oral BID WC Fransisca Kaufmann, NP   500 mg at 11/21/12 0744  . sertraline (ZOLOFT) tablet 150 mg  150 mg Oral Daily Charlene Cowdrey   150 mg at 11/22/12 0812  . traZODone (DESYREL) tablet 100 mg  100 mg Oral QHS,MR X 1 Oslo Huntsman   100 mg at 11/21/12 2144    Lab Results:  Results for orders placed during the hospital encounter of 11/11/12 (from the past 48 hour(s))  GLUCOSE, CAPILLARY     Status: None   Collection Time    11/20/12 11:52 AM       Result Value Range   Glucose-Capillary 96  70 - 99 mg/dL  GLUCOSE, CAPILLARY     Status: None   Collection Time    11/20/12  4:27 PM      Result Value Range   Glucose-Capillary 96  70 - 99 mg/dL    Physical Findings: AIMS: Facial and Oral Movements Muscles of Facial Expression: None, normal Lips and Perioral Area: None, normal Jaw: None, normal Tongue: None, normal,Extremity Movements Upper (arms, wrists, hands, fingers): None, normal Lower (legs, knees, ankles, toes): None, normal, Trunk Movements Neck, shoulders, hips: None, normal, Overall Severity Severity of abnormal movements (highest score from questions above): None, normal Incapacitation due to abnormal movements: None, normal Patient's awareness of abnormal movements (rate only patient's report): No Awareness, Dental Status Current problems with teeth and/or dentures?: No Does patient usually wear dentures?: No  CIWA:    COWS:     Treatment Plan Summary: Daily contact with patient to assess and evaluate symptoms and progress in treatment Medication management  Plan: Continue crisis management and stabilization.  Medication management: Continue Cogentin 1 mg at qhs for EPS prevention. Continue Prolixin to 10mg  bid for psychosis and delusions  Continue Zoloft to 150 mg daily for depressive symptoms.  Continue Lisinopril 5 mg daily.  Patient on the [facility] waiting list due to lack of improvement of symptoms. Will monitor for continued medication refusals and will initiate forced medications if necessary as second opinion is in place from Dr. Dub Mikes.  Address health issues: Vitals reviewed and stable. Patient needing encouragement to shower and tend to hygiene needs.  Continue  Metformin 500 mg BID for A1C of 6.2. CBG checks ordered TID. CBG's monitored as ordered and running in the 90's.   Medical Decision Making Problem Points:  Established problem, slowly improving Data Points:  Review of medication regiment & side  effects (2) Review of new medications or change in dosage (2)  I certify that inpatient services furnished can reasonably be expected to improve the patient's condition.   Thedore Mins, MD 11/22/2012, 9:57 AM

## 2012-11-22 NOTE — Tx Team (Signed)
  Interdisciplinary Treatment Plan Update   Date Reviewed:  11/22/2012  Time Reviewed:  8:17 AM  Progress in Treatment:   Attending groups: Yes Participating in groups: Yes Taking medication as prescribed: Yes  Tolerating medication: Yes Family/Significant other contact made: Yes  Patient understands diagnosis: Yes  Discussing patient identified problems/goals with staff: Yes Medical problems stabilized or resolved: Yes Denies suicidal/homicidal ideation: Yes Patient has not harmed self or others: Yes  For review of initial/current patient goals, please see plan of care.  Estimated Length of Stay:  D/C tomorrow  Reason for Continuation of Hospitalization:   New Problems/Goals identified:  N/A  Discharge Plan or Barriers:   return home, follow up outpt  Additional Comments:  Attendees:  Signature: Thedore Mins, MD 11/22/2012 8:17 AM   Signature: Richelle Ito, LCSW 11/22/2012 8:17 AM  Signature: Fransisca Kaufmann, NP 11/22/2012 8:17 AM  Signature: Joslyn Devon, RN 11/22/2012 8:17 AM  Signature: Liborio Nixon, RN 11/22/2012 8:17 AM  Signature:  11/22/2012 8:17 AM  Signature:   11/22/2012 8:17 AM  Signature:    Signature:    Signature:    Signature:    Signature:    Signature:      Scribe for Treatment Team:   Richelle Ito, LCSW  11/22/2012 8:17 AM

## 2012-11-22 NOTE — [facility] Group Notes (Signed)
[facility] LCSW Group Therapy  11/22/2012 1:15 pm  Type of Therapy: Process Group Therapy  Participation Level:  Did not attend    Summary of Progress/Problems: Today's group addressed the issue of overcoming obstacles.  Patients were asked to identify their biggest obstacle post d/c that stands in the way of their on-going success, and then problem solve as to how to manage this.  Daryel Gerald B 11/22/2012   4:08 PM

## 2012-11-23 LAB — GLUCOSE, CAPILLARY: Glucose-Capillary: 108 mg/dL — ABNORMAL HIGH (ref 70–99)

## 2012-11-23 MED ORDER — LORAZEPAM 0.5 MG PO TABS: 0.5000 mg | ORAL_TABLET | Freq: Two times a day (BID) | ORAL | Status: DC

## 2012-11-23 MED ORDER — FLUPHENAZINE HCL 10 MG PO TABS: 10.0000 mg | ORAL_TABLET | Freq: Two times a day (BID) | ORAL | Status: DC

## 2012-11-23 MED ORDER — SERTRALINE HCL 50 MG PO TABS: 150.0000 mg | ORAL_TABLET | Freq: Every day | ORAL | Status: DC

## 2012-11-23 MED ORDER — TRAZODONE HCL 100 MG PO TABS: 100.0000 mg | ORAL_TABLET | Freq: Every evening | ORAL | Status: DC | PRN

## 2012-11-23 MED ORDER — BENZTROPINE MESYLATE 1 MG PO TABS: 1.0000 mg | ORAL_TABLET | Freq: Every day | ORAL | Status: DC

## 2012-11-23 MED ORDER — LISINOPRIL 5 MG PO TABS: 5.0000 mg | ORAL_TABLET | Freq: Every day | ORAL | Status: DC

## 2012-11-23 MED ORDER — METFORMIN HCL 500 MG PO TABS: 500.0000 mg | ORAL_TABLET | Freq: Two times a day (BID) | ORAL | Status: DC

## 2012-11-23 MED ORDER — SERTRALINE HCL 50 MG PO TABS
150.0000 mg | ORAL_TABLET | Freq: Every day | ORAL | Status: DC
  Filled 2012-11-23: qty 42

## 2012-11-23 NOTE — Discharge Summary (Signed)
Physician Discharge Summary Note  Patient:  Stanley Watkins is an 62 y.o., male MRN:  [id number] DOB:  [birth year] Patient phone:  [phone] (home)  Patient address:   [address] [place] [address]   Date of Admission:  11/11/2012 Date of Discharge: 11/23/12  Discharge Diagnoses: Active Problems:   * No active hospital problems. *  Axis Diagnosis:  AXIS I: Schizoaffective disorder  AXIS II: Deferred  AXIS III:  Past Medical History   Diagnosis  Date   .  Diabetes mellitus without complication      pre diabetes   .  Hypertension      non complaint   AXIS IV: other psychosocial or environmental problems and problems related to social environment  AXIS V: 61-70 mild symptoms  Level of Care:  OP  Hospital Course:   Stanley Watkins is a 62 year old male who presented to [facility] via [facility] with IVC papers taken out by his wife who was concerned that the patient had stopped taking medications since March 2014 and verbally communicating as well. The ED notes indicate that the patient has not communicated with any staff since his arrival to the ED. The patient's wife reported to ED staff that the patient thinks she is controlling his mind and also feels that he is being spied on by the government. The patient has a history of schizoaffective disorder diagnosed by Dr. Tomasa Rand. The chart also indicates that the patient has a history of IVC commitments in the past and also several admissions to [facility]. Today upon assessment the patient does not acknowledge writer and is observed resting in bed with no eye contact made. Patient does not respond to gentle nudging of his leg either with his name being called. The patient gave no verbal or physical response when the admission questions were asked.   While a patient in this hospital, Stanley Watkins was enrolled in group counseling and activities as well as received the following medication No current facility-administered  medications for this encounter. Current outpatient prescriptions:benztropine (COGENTIN) 1 MG tablet, Take 1 tablet (1 mg total) by mouth at bedtime., Disp: 30 tablet, Rfl: 0;  fluPHENAZine (PROLIXIN) 10 MG tablet, Take 1 tablet (10 mg total) by mouth 2 (two) times daily., Disp: 60 tablet, Rfl: 0;  lisinopril (PRINIVIL,ZESTRIL) 5 MG tablet, Take 1 tablet (5 mg total) by mouth daily., Disp: 30 tablet, Rfl: 0 LORazepam (ATIVAN) 0.5 MG tablet, Take 1 tablet (0.5 mg total) by mouth 2 (two) times daily., Disp: 60 tablet, Rfl: 0;  metFORMIN (GLUCOPHAGE) 500 MG tablet, Take 1 tablet (500 mg total) by mouth 2 (two) times daily with a meal., Disp: 60 tablet, Rfl: 0;  sertraline (ZOLOFT) 50 MG tablet, Take 3 tablets (150 mg total) by mouth daily., Disp: 90 tablet, Rfl: 0 traZODone (DESYREL) 100 MG tablet, Take 1 tablet (100 mg total) by mouth at bedtime and may repeat dose one time if needed., Disp: 60 tablet, Rfl: 0 The patient during his admission continued to be selectively mute with staff. The nursing staff would report that he would request something to eat but then would not talk to complete daily assessments. He was observed often lying in bed staring at the ceiling refusing to make eye contact. The patient was started on Prolixin 10 mg BID for psychosis and Zoloft 150 mg for depression. These medications were titrated during his stay. Patient was also started on metformin for type 2 diabetes and Lisinopril for elevated blood pressure. The  patient did not attend groups and his hygiene was very poor. He needed encouragement to shower and his room was noted to have a strong odor. Patient had limited verbal communication but did express a desire to go home. Patient slowly showed improvement in his symptoms. However, patient remained selectively mute even during his discharge from the facility. Patient was found stable to discharge and returned home with his wife.  Patient attended treatment team meeting this am and  met with treatment team members. Pt symptoms, treatment plan and response to treatment discussed. Stanley Watkins endorsed that their symptoms have improved. Pt also stated that they are stable for discharge.  In other to control Active Problems:   * No active hospital problems. * , they will continue psychiatric care on outpatient basis. They will follow-up at  Follow-up Information   Follow up with [facility] On 11/29/2012. (with Dr Tomasa Rand at 11:45 AM)    Contact information:   [address] [facility], [place] [phone]    .  In addition they were instructed to take all your medications as prescribed by your mental healthcare provider, to report any adverse effects and or reactions from your medicines to your outpatient provider promptly, patient is instructed and cautioned to not engage in alcohol and or illegal drug use while on prescription medicines, in the event of worsening symptoms, patient is instructed to call the crisis hotline, 911 and or go to the nearest ED for appropriate evaluation and treatment of symptoms.   Upon discharge, patient adamantly denies suicidal, homicidal ideations, auditory, visual hallucinations and or delusional thinking. They left [facility] with all personal belongings in no apparent distress.  Consults:  See electronic record for details  Significant Diagnostic Studies:  See electronic record for details  Discharge Vitals:   Blood pressure 128/89, pulse 81, temperature 97.8 F (36.6 C), temperature source Oral, resp. rate 20, height 5' 8.9" (1.75 m), weight 105.235 kg (232 lb)..  Mental Status Exam: See Mental Status Examination and Suicide Risk Assessment completed by Attending Physician prior to discharge.  Discharge destination:  Home  Is patient on multiple antipsychotic therapies at discharge:  No  Has Patient had three or more failed trials of antipsychotic monotherapy by history: N/A Recommended Plan for Multiple Antipsychotic  Therapies: N/A     Discharge Orders   Future Orders Complete By Expires   Discharge instructions  As directed    Comments:     Please follow up with your Primary Care Provider for further management of your high blood pressure and diabetes. You are started on Lisinopril and Metformin during your hospital stay.       Medication List    STOP taking these medications       methylphenidate 5 MG tablet  Commonly known as:  RITALIN      TAKE these medications     Indication   benztropine 1 MG tablet  Commonly known as:  COGENTIN  Take 1 tablet (1 mg total) by mouth at bedtime.   Indication:  Extrapyramidal Reaction caused by Medications     fluPHENAZine 10 MG tablet  Commonly known as:  PROLIXIN  Take 1 tablet (10 mg total) by mouth 2 (two) times daily.   Indication:  Psychosis     lisinopril 5 MG tablet  Commonly known as:  PRINIVIL,ZESTRIL  Take 1 tablet (5 mg total) by mouth daily.   Indication:  High Blood Pressure     LORazepam 0.5 MG tablet  Commonly known as:  ATIVAN  Take 1 tablet (0.5 mg total) by mouth 2 (two) times daily.   Indication:  Catatonia     metFORMIN 500 MG tablet  Commonly known as:  GLUCOPHAGE  Take 1 tablet (500 mg total) by mouth 2 (two) times daily with a meal.   Indication:  Type 2 Diabetes     sertraline 50 MG tablet  Commonly known as:  ZOLOFT  Take 3 tablets (150 mg total) by mouth daily.   Indication:  Major Depressive Disorder     traZODone 100 MG tablet  Commonly known as:  DESYREL  Take 1 tablet (100 mg total) by mouth at bedtime and may repeat dose one time if needed.   Indication:  Trouble Sleeping       Follow-up Information   Follow up with [facility] On 11/29/2012. (with Dr Tomasa Rand at 11:45 AM)    Contact information:   [address]  [place] [336] [phone]     Follow-up recommendations:   Activities: Resume typical activities Diet: Resume typical diet Tests: none Other: Follow up with  outpatient provider and report any side effects to out patient prescriber.  Comments:  Take all your medications as prescribed by your mental healthcare provider. Report any adverse effects and or reactions from your medicines to your outpatient provider promptly. Patient is instructed and cautioned to not engage in alcohol and or illegal drug use while on prescription medicines. In the event of worsening symptoms, patient is instructed to call the crisis hotline, 911 and or go to the nearest ED for appropriate evaluation and treatment of symptoms. Follow-up with your primary care provider for your other medical issues, concerns and or health care needs.  SignedFransisca Kaufmann NP-C 11/24/2012 11:36 AM

## 2012-11-23 NOTE — Progress Notes (Signed)
Discharge note: Pt received both written and verbal discharge instructions. Pt did not verbalize understanding. Pt selectively mute during discharge. Pt received all belongings from room and locker. Pt was given prescriptions and sample meds. Pt safely left [facility] with his wife.   During am shift assessment, pt spoke with MD and writer and verbalized that he was ready to go home today. Pt denied SI during assessment.

## 2012-11-23 NOTE — [facility] Suicide Risk Assessment (Signed)
Suicide Risk Assessment  Discharge Assessment     Demographic Factors:  Male and lives with his wife  Mental Status Per Nursing Assessment::   On Admission:   (denies si/av/hi )  Current Mental Status by Physician: patient denies suicidal ideation, intent or plan  Loss Factors: Financial problems/change in socioeconomic status  Historical Factors: NA  Risk Reduction Factors:   Sense of responsibility to family, Living with another person, especially a relative and Positive social support  Continued Clinical Symptoms:  Resolving mood and psychotic symptoms  Cognitive Features That Contribute To Risk:  Closed-mindedness Polarized thinking    Suicide Risk:  Minimal: No identifiable suicidal ideation.  Patients presenting with no risk factors but with morbid ruminations; may be classified as minimal risk based on the severity of the depressive symptoms  Discharge Diagnoses:   AXIS I:  Schizoaffective disorder  AXIS II:  Deferred AXIS III:   Past Medical History  Diagnosis Date  . Diabetes mellitus without complication     pre diabetes  . Hypertension     non complaint   AXIS IV:  other psychosocial or environmental problems and problems related to social environment AXIS V:  61-70 mild symptoms  Plan Of Care/Follow-up recommendations:  Activity:  as tolerated Diet:  healthy Tests:  routine Other:  patient to keep his after care appointment  Is patient on multiple antipsychotic therapies at discharge:  No   Has Patient had three or more failed trials of antipsychotic monotherapy by history:  No  Recommended Plan for Multiple Antipsychotic Therapies: N/A  Thedore Mins, MD 11/23/2012, 9:01 AM

## 2012-11-23 NOTE — Progress Notes (Signed)
Patient ID: Stanley Watkins, male   DOB: 14-Sep-1950, 62 y.o.   MRN: [id number]  D: Pt was laying in the bed resting during the adm process. Like previous days pt wouldn't engage the Clinical research associate in conversation or eye contact. Writer attempted to encourage conversation by discussing pt's pending discharge, however pt still didn't engage. Pt accepted his meds without incident. When asked if he had any questions or concerns, pt would only point to the A/C. Pt informed writer to speak and informed him that he could turn the A/C himself.   A:  Support and encouragement was offered. 15 min checks continued for safety.  R: Pt remains safe.

## 2012-11-23 NOTE — Tx Team (Signed)
  Interdisciplinary Treatment Plan Update   Date Reviewed:  11/23/2012  Time Reviewed:  2:07 PM  Progress in Treatment:   Attending groups: Yes Participating in groups: Yes Taking medication as prescribed: Yes  Tolerating medication: Yes Family/Significant other contact made: Yes  Patient understands diagnosis: Yes  Discussing patient identified problems/goals with staff: Yes Medical problems stabilized or resolved: Yes Denies suicidal/homicidal ideation: Yes Patient has not harmed self or others: Yes  For review of initial/current patient goals, please see plan of care.  Estimated Length of Stay:  D/C today  Reason for Continuation of Hospitalization:   New Problems/Goals identified:  N/A  Discharge Plan or Barriers:   return home, follow up outpt  Additional Comments:  Attendees:  Signature: Thedore Mins, MD 11/23/2012 2:07 PM   Signature: Richelle Ito, LCSW 11/23/2012 2:07 PM  Signature: Fransisca Kaufmann, NP 11/23/2012 2:07 PM  Signature: Joslyn Devon, RN 11/23/2012 2:07 PM  Signature: Liborio Nixon, RN 11/23/2012 2:07 PM  Signature:  11/23/2012 2:07 PM  Signature:   11/23/2012 2:07 PM  Signature:    Signature:    Signature:    Signature:    Signature:    Signature:      Scribe for Treatment Team:   Richelle Ito, LCSW  11/23/2012 2:07 PM

## 2012-11-23 NOTE — Progress Notes (Signed)
[facility] Adult Case Management Discharge Plan :  Will you be returning to the same living situation after discharge: Yes,  home At discharge, do you have transportation home?:Yes,  family Do you have the ability to pay for your medications:Yes,  insurance  Release of information consent forms completed and in the chart;  Patient's signature needed at discharge.  Patient to Follow up at: Follow-up Information   Follow up with [facility] On 11/29/2012. (with Dr Tomasa Rand at 11:45 AM)    Contact information:   [address]  [place] [336] [phone]      Patient denies SI/HI:   Yes,  yes    Safety Planning and Suicide Prevention discussed:  Yes,  yes  Ida Rogue 11/23/2012, 10:56 AM

## 2012-11-24 NOTE — Progress Notes (Signed)
Patient Discharge Instructions:  After Visit Summary (AVS):   Faxed to:  11/24/12 Discharge Summary Note:   Faxed to:  11/24/12 Psychiatric Admission Assessment Note:   Faxed to:  11/24/12 Suicide Risk Assessment - Discharge Assessment:   Faxed to:  11/24/12 Faxed/Sent to the Next Level Care provider:  11/24/12 Faxed to [facility] @ [phone]  Jerelene Redden, 11/24/2012, 5:19 PM

## 2012-11-25 NOTE — Discharge Summary (Signed)
Seen and agreed. Jaiah Weigel, MD 

## 2013-09-22 ENCOUNTER — Emergency Department (HOSPITAL_COMMUNITY)
Admission: EM | Admit: 2013-09-22 | Discharge: 2013-09-23 | Disposition: A | Discharge status: Other Acute Inpatient Hospital | Payer: 59 | Attending: Emergency Medicine | Admitting: Emergency Medicine

## 2013-09-22 ENCOUNTER — Encounter (HOSPITAL_COMMUNITY): Payer: Self-pay | Admitting: Emergency Medicine

## 2013-09-22 DIAGNOSIS — E119 Type 2 diabetes mellitus without complications: Secondary | ICD-10-CM | POA: Insufficient documentation

## 2013-09-22 DIAGNOSIS — F259 Schizoaffective disorder, unspecified: Secondary | ICD-10-CM | POA: Insufficient documentation

## 2013-09-22 DIAGNOSIS — I10 Essential (primary) hypertension: Secondary | ICD-10-CM | POA: Insufficient documentation

## 2013-09-22 DIAGNOSIS — F251 Schizoaffective disorder, depressive type: Secondary | ICD-10-CM

## 2013-09-22 HISTORY — DX: Unspecified dementia, unspecified severity, without behavioral disturbance, psychotic disturbance, mood disturbance, and anxiety: F03.90

## 2013-09-22 LAB — COMPREHENSIVE METABOLIC PANEL
ALT: 8 U/L (ref 0–53)
AST: 14 U/L (ref 0–37)
Albumin: 4.3 g/dL (ref 3.5–5.2)
Alkaline Phosphatase: 86 U/L (ref 39–117)
Anion gap: 15 (ref 5–15)
BUN: 5 mg/dL — ABNORMAL LOW (ref 6–23)
CO2: 23 mEq/L (ref 19–32)
Calcium: 9.9 mg/dL (ref 8.4–10.5)
Chloride: 101 mEq/L (ref 96–112)
Creatinine, Ser: 0.91 mg/dL (ref 0.50–1.35)
GFR calc Af Amer: >90 mL/min (ref >90)
GFR calc non Af Amer: 90 mL/min — ABNORMAL LOW (ref >90)
Glucose, Bld: 115 mg/dL — ABNORMAL HIGH (ref 70–99)
Potassium: 3.3 mEq/L — ABNORMAL LOW (ref 3.7–5.3)
Sodium: 139 mEq/L (ref 137–147)
Total Bilirubin: 0.6 mg/dL (ref 0.3–1.2)
Total Protein: 8.5 g/dL — ABNORMAL HIGH (ref 6.0–8.3)

## 2013-09-22 LAB — CBC
HCT: 46.6 % (ref 39.0–52.0)
Hemoglobin: 16 g/dL (ref 13.0–17.0)
MCH: 27.4 pg (ref 26.0–34.0)
MCHC: 34.3 g/dL (ref 30.0–36.0)
MCV: 79.8 fL (ref 78.0–100.0)
Platelets: 170 10*3/uL (ref 150–400)
RBC: 5.84 MIL/uL — ABNORMAL HIGH (ref 4.22–5.81)
RDW: 15.3 % (ref 11.5–15.5)
WBC: 4.4 10*3/uL (ref 4.0–10.5)

## 2013-09-22 LAB — RAPID URINE DRUG SCREEN, HOSP PERFORMED
Amphetamines: NOT DETECTED
Barbiturates: NOT DETECTED
Benzodiazepines: NOT DETECTED
Cocaine: NOT DETECTED
Opiates: NOT DETECTED
Tetrahydrocannabinol: NOT DETECTED

## 2013-09-22 LAB — ETHANOL: Alcohol, Ethyl (B): <11 mg/dL (ref 0–11)

## 2013-09-22 MED ORDER — ALUM & MAG HYDROXIDE-SIMETH 200-200-20 MG/5ML PO SUSP: 30.0000 mL | ORAL | Status: DC | PRN

## 2013-09-22 MED ORDER — LORAZEPAM 0.5 MG PO TABS
0.5000 mg | ORAL_TABLET | Freq: Two times a day (BID) | ORAL | Status: DC
  Filled 2013-09-22: qty 1

## 2013-09-22 MED ORDER — ONDANSETRON HCL 4 MG PO TABS: 4.0000 mg | ORAL_TABLET | Freq: Three times a day (TID) | ORAL | Status: DC | PRN

## 2013-09-22 MED ORDER — SERTRALINE HCL 50 MG PO TABS
150.0000 mg | ORAL_TABLET | Freq: Every day | ORAL | Status: DC
  Filled 2013-09-22: qty 3

## 2013-09-22 MED ORDER — FLUPHENAZINE HCL 10 MG PO TABS: 10.0000 mg | ORAL_TABLET | Freq: Two times a day (BID) | ORAL | Status: DC

## 2013-09-22 MED ORDER — LISINOPRIL 5 MG PO TABS
5.0000 mg | ORAL_TABLET | Freq: Every day | ORAL | Status: DC
Start: 2013-09-22 — End: 2013-09-23
  Filled 2013-09-22 (×2): qty 1

## 2013-09-22 MED ORDER — LORAZEPAM 1 MG PO TABS: 1.0000 mg | ORAL_TABLET | Freq: Three times a day (TID) | ORAL | Status: DC | PRN

## 2013-09-22 MED ORDER — ACETAMINOPHEN 325 MG PO TABS: 650.0000 mg | ORAL_TABLET | ORAL | Status: DC | PRN

## 2013-09-22 MED ORDER — BENZTROPINE MESYLATE 1 MG PO TABS: 1.0000 mg | ORAL_TABLET | Freq: Every day | ORAL | Status: DC

## 2013-09-22 MED ORDER — IBUPROFEN 200 MG PO TABS: 600.0000 mg | ORAL_TABLET | Freq: Three times a day (TID) | ORAL | Status: DC | PRN

## 2013-09-22 MED ORDER — METFORMIN HCL 500 MG PO TABS: 500.0000 mg | ORAL_TABLET | Freq: Two times a day (BID) | ORAL | Status: DC

## 2013-09-22 MED ORDER — TRAZODONE HCL 100 MG PO TABS: 100.0000 mg | ORAL_TABLET | Freq: Every evening | ORAL | Status: DC | PRN

## 2013-09-22 NOTE — BHH Counselor (Addendum)
Contacted the following facilities to determine if they have an appropriate bed available.     No Beds:  [facility] per [place] no beds on any level but can try again tomorrow if Pt still needs placement.  [facility] per [place] no gero beds available at this time.  [facility] per [place] full at this time.  Unsure: [facility] per [place]aryl NimrodMeredith the behavioral health supervisor, status is unknown as they have no intake TEFL teacherworker tonight. Intake coming in the morning and status should be known at that time.   [facility] per [facility] they do not have a gero unit.  Faxed: [facility]   Per [place] bed available - no beds as of 0430 09/23/13  [facility] per [place] beds available in morning - send fax - declined no dementia [facility] per [place] beds available in the morning - send fax - declined no dementia  Clista BernhardtNancy Arriyah Madej, [facility] Triage Specialist 09/22/2013 8:41 PM

## 2013-09-22 NOTE — BH Assessment (Deleted)
Assessment Note  Stanley Watkins is an 63 y.o. male. Pt [facility] to [facility]. Per IVC taken out by wife and guardian, Curties Conigliaro, [phone], pt has been refusing to communicate, eat or bathe. Pt refuses to answer writer's questions. He lies on his side staring at his foot while he points and flexes foot. Per chart review, pt was admitted to [facility] Sept 2014 for schizoaffective d/o.  Collateral info provided by wife [name] via phone: She sts pt has been dealing w/ mental illness for past 25 years. 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 She says pt lives w/ her and their 50 yo son. She reports pt's maternal side has hx of mental illness and pt's mom had Alzheimer's. Wife says pt used to see Dr Tiajuana Amass for med management but hasn't been to appt in several mos. Wife says pt doesn't speak - she says he will utter a few words over an entire week. She says pt refuses to bathe. She says he won't eat , even when she brings him his favorite foods. She sts pt is intelligent and completed one year at [employer].  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 Pt pending psych eval by extender am 09/23/13.  Axis I: Schizoaffective Disorder Axis II: Deferred Axis III:  Past Medical History  Diagnosis Date  . Diabetes mellitus without complication     pre diabetes  . Schizoaffective disorder   . Depression, major   . Hypertension     non complaint  . Dementia    Axis IV: other psychosocial or environmental problems and problems related to social environment Axis V: 11-20 some danger of hurting self or others possible OR occasionally fails to maintain minimal personal hygiene OR gross impairment in communication  Past Medical History:  Past Medical History  Diagnosis Date  . Diabetes  mellitus without complication     pre diabetes  . Schizoaffective disorder   . Depression, major   . Hypertension     non complaint  . Dementia     History reviewed. No pertinent past surgical history.  Family History:  Family History  Problem Relation Age of Onset  . Diabetes Mother   . Cancer Father   . Schizophrenia Maternal Aunt     Social History:  reports that he has never smoked. He has never used smokeless tobacco. He reports that he does not drink alcohol. His drug history is not on file.  Additional Social History:  Alcohol / Drug Use History of alcohol / drug use?:  (unable to assess)  CIWA: CIWA-Ar BP: 140/79 mmHg Pulse Rate: 81 COWS:    Allergies: No Known Allergies  Home Medications:  (Not in a hospital admission)  OB/GYN Status:  No LMP for male patient.  General Assessment Data [facility] Is this a Tele or Face-to-Face Assessment?: Face-to-Face Is this an Initial Assessment or a Re-assessment for this encounter?: Initial Assessment Living Arrangements: Spouse/significant other;Children (wife & 68 yo son) Can pt return to current living arrangement?: Yes Admission Status: Involuntary Is patient capable of signing voluntary admission?: No Transfer from: Home Referral Source: Self/Family/Friend     [facility] Crisis Care Plan Living Arrangements: Spouse/significant other;Children (wife & 66 yo son)  Name of Psychiatrist: none Name of Therapist: none  Education Status Is patient currently in school?: No Highest grade of school patient has completed: 63 Name of school: [employer]  Risk to self Suicidal Ideation:  (unable to assess) Suicidal Intent:  (unable to assess) Is patient at risk for suicide?: No Suicidal Plan?:  (unable to assess) What has been your use of drugs/alcohol within the last 12 months?: n/a Previous Attempts/Gestures: No (per wife) Triggers for Past Attempts:  (n/a) Intentional Self Injurious  Behavior:  (unable to assess) Family Suicide History: No (maternal side has mental illness) Recent stressful life event(s):  (unable to assess) Depression Symptoms:  (unable to assess) Substance abuse history and/or treatment for substance abuse?: No Suicide prevention information given to non-admitted patients: Not applicable  Risk to Others Homicidal Ideation:  (unable to assess) Thoughts of Harm to Others:  (unable to assess) Current Homicidal Intent:  (unable to assess) Current Homicidal Plan:  (unable to assess) Access to Homicidal Means: No History of harm to others?: No Assessment of Violence: None Noted Violent Behavior Description:  (wife sts pt not violent) Does patient have access to weapons?: No Criminal Charges Pending?: No Does patient have a court date: No  Psychosis Hallucinations:  (unable to assess) Delusions:  (unable to assess)  Mental Status Report Appear/Hygiene: In scrubs (thin) Eye Contact: Poor (looking at feet) Motor Activity: Other (Comment) (pt's foot moves but nothing else) Speech: Unable to assess (doesn't speak) Level of Consciousness: Quiet/awake Mood:  (unable to assess) Affect: Blunted Anxiety Level:  (unable to assess) Thought Processes: Unable to Assess Judgement: Unable to Assess Orientation: Unable to assess Obsessive Compulsive Thoughts/Behaviors: Unable to Assess  Cognitive Functioning Concentration: Unable to Assess Memory: Unable to Assess IQ: Average Insight: Unable to Assess Impulse Control: Unable to Assess Appetite:  (pt doesn't eat) Weight Loss:  (wife sts great weight loss) Sleep: Unable to Assess Vegetative Symptoms: Not bathing  ADLScreening [facility] Assessment Services) Patient's cognitive ability adequate to safely complete daily activities?:  (unable to assess) Patient able to express need for assistance with ADLs?:  (pt doesn't speak anymroe) Independently performs ADLs?:  (wife says pt can perform ADLs but chooses  not to)  Prior Inpatient Therapy Prior Inpatient Therapy: Yes Prior Therapy Dates: Sept 2014 Prior Therapy Facilty/Provider(s): [facility] Reason for Treatment: schizoaffective d/o  Prior Outpatient Therapy Prior Outpatient Therapy: Yes Prior Therapy Dates: until 7 mos ago Prior Therapy Facilty/Provider(s): Dr Tiajuana Amass Reason for Treatment: med management  ADL Screening (condition at time of admission) Patient's cognitive ability adequate to safely complete daily activities?:  (unable to assess) Is the patient deaf or have difficulty hearing?:  (unable to assess) Does the patient have difficulty seeing, even when wearing glasses/contacts?:  (unable to assess) Does the patient have difficulty concentrating, remembering, or making decisions?:  (unable to assess) Patient able to express need for assistance with ADLs?:  (pt doesn't speak anymroe) Does the patient have difficulty dressing or bathing?:  (pt refuses to bathe) Independently performs ADLs?:  (wife says pt can perform ADLs but chooses not to) Does the patient have difficulty walking or climbing stairs?: No  Home Assistive Devices/Equipment Home Assistive Devices/Equipment: None    Abuse/Neglect Assessment (Assessment to be complete while patient is alone) Physical Abuse:  (unable to assess) Verbal Abuse:  (unable to assess) Sexual Abuse:  (unable to assess) Exploitation of patient/patient's resources:  (unable to assess) Self-Neglect:  (per wife - pt won't groom or eat) Values /  Beliefs Cultural Requests During Hospitalization: None Spiritual Requests During Hospitalization: None        Additional Information 1:1 In Past 12 Months?: No CIRT Risk: No Elopement Risk: No Does patient have medical clearance?:  (unknown as no provider note completed)     Disposition:  Disposition Initial Assessment Completed for this Encounter: Yes Disposition of Patient:  (pending psych eval)  On Site Evaluation by:    Reviewed with Physician:    Donnamarie RossettiMCLEAN, Navneet Schmuck P 09/22/2013 7:18 PM

## 2013-09-22 NOTE — ED Notes (Signed)
Sodq/crackers chese offered.

## 2013-09-22 NOTE — ED Notes (Signed)
Pt. Alert, warm and dry in no obvious distress. Pt. Not responding verbally.

## 2013-09-22 NOTE — BHH Counselor (Addendum)
Pt's wife is guardian [name]- Wanda Pauley [phone]- [phone]. Copy of guardianship paper in pt's chart.    Evette Cristalaroline Paige Christo Hain, [place] Assessment Counselor

## 2013-09-22 NOTE — BH Assessment (Signed)
Assessment Note  Stanley Watkins is an 63 y.o. male. Stanley Watkins is an 63 y.o. male. [facility]. Per IVC taken out by wife and guardian, Stanley Watkins, [phone], pt has been refusing to communicate, eat or bathe. Pt refuses to answer writer's questions. He lies on his side staring at his foot while he points and flexes foot. Per chart review, pt was admitted to [facility] Sept 2014 for schizoaffective d/o.  Collateral info provided by wife [name] via phone: She sts pt has been dealing w/ mental illness for past 25 years. 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 She says pt lives w/ her and their 28 yo son. She reports pt's maternal side has hx of mental illness and pt's mom had Alzheimer's. Wife says pt used to see Dr Tiajuana Amass for med management but hasn't been to appt in several mos. Wife says pt doesn't speak - she says he will utter a few words over an entire week. She says pt refuses to bathe. She says he won't eat , even when she brings him his favorite foods. She sts pt is intelligent and completed one year at [employer].  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 Pt pending psych eval by extender am 09/23/13. Dr. Jannifer Franklin recommends geropsych placement. He declines pt at [facility] d/t pt's not eating.  Axis I: Schizoaffective Disorder Axis II: Deferred Axis III:  Past Medical History  Diagnosis Date  . Diabetes mellitus without complication     pre diabetes  . Schizoaffective disorder   . Depression, major   . Hypertension     non complaint  . Dementia    Axis IV: other psychosocial or environmental problems and problems related to social environment Axis V: 11-20 some danger of hurting self or others possible OR occasionally fails to maintain minimal  personal hygiene OR gross impairment in communication  Past Medical History:  Past Medical History  Diagnosis Date  . Diabetes mellitus without complication     pre diabetes  . Schizoaffective disorder   . Depression, major   . Hypertension     non complaint  . Dementia     History reviewed. No pertinent past surgical history.  Family History:  Family History  Problem Relation Age of Onset  . Diabetes Mother   . Cancer Father   . Schizophrenia Maternal Aunt     Social History:  reports that he has never smoked. He has never used smokeless tobacco. He reports that he does not drink alcohol. His drug history is not on file.  Additional Social History:  Alcohol / Drug Use History of alcohol / drug use?:  (unable to assess)  CIWA: CIWA-Ar BP: 140/79 mmHg Pulse Rate: 81 COWS:    Allergies: No Known Allergies  Home Medications:  (Not in a hospital admission)  OB/GYN Status:  No LMP for male patient.  General Assessment Data [facility] Is this a Tele or Face-to-Face Assessment?: Face-to-Face Is this an Initial Assessment or a Re-assessment for this encounter?: Initial Assessment Living Arrangements: Spouse/significant other;Children (wife & 86 yo son) Can pt return to current living arrangement?: Yes Admission Status: Involuntary Is patient capable of signing voluntary admission?: No  Transfer from: Home Referral Source: Self/Family/Friend     [facility] Living Arrangements: Spouse/significant other;Children (wife & 62 yo son) Name of Psychiatrist: none Name of Therapist: none  Education Status Is patient currently in school?: No Highest grade of school patient has completed: 2317 Name of school: [employer]  Risk to self Suicidal Ideation:  (unable to assess) Suicidal Intent:  (unable to assess) Is patient at risk for suicide?: No Suicidal Plan?:  (unable to assess) What has been your use of drugs/alcohol within the  last 12 months?: n/a Previous Attempts/Gestures: No (per wife) Triggers for Past Attempts:  (n/a) Intentional Self Injurious Behavior:  (unable to assess) Family Suicide History: No (maternal side has mental illness) Recent stressful life event(s):  (unable to assess) Depression Symptoms:  (unable to assess) Substance abuse history and/or treatment for substance abuse?: No Suicide prevention information given to non-admitted patients: Not applicable  Risk to Others Homicidal Ideation:  (unable to assess) Thoughts of Harm to Others:  (unable to assess) Current Homicidal Intent:  (unable to assess) Current Homicidal Plan:  (unable to assess) Access to Homicidal Means: No History of harm to others?: No Assessment of Violence: None Noted Violent Behavior Description:  (wife sts pt not violent) Does patient have access to weapons?: No Criminal Charges Pending?: No Does patient have a court date: No  Psychosis Hallucinations:  (unable to assess) Delusions:  (unable to assess)  Mental Status Report Appear/Hygiene: In scrubs (thin) Eye Contact: Poor (looking at feet) Motor Activity: Other (Comment) (pt's foot moves but nothing else) Speech: Unable to assess (doesn't speak) Level of Consciousness: Quiet/awake Mood:  (unable to assess) Affect: Blunted Anxiety Level:  (unable to assess) Thought Processes: Unable to Assess Judgement: Unable to Assess Orientation: Unable to assess Obsessive Compulsive Thoughts/Behaviors: Unable to Assess  Cognitive Functioning Concentration: Unable to Assess Memory: Unable to Assess IQ: Average Insight: Unable to Assess Impulse Control: Unable to Assess Appetite:  (pt doesn't eat) Weight Loss:  (wife sts great weight loss) Sleep: Unable to Assess Vegetative Symptoms: Not bathing  ADLScreening [facility] Assessment Services) Patient's cognitive ability adequate to safely complete daily activities?:  (unable to assess) Patient able to express need  for assistance with ADLs?:  (pt doesn't speak anymroe) Independently performs ADLs?:  (wife says pt can perform ADLs but chooses not to)  Prior Inpatient Therapy Prior Inpatient Therapy: Yes Prior Therapy Dates: Sept 2014 [facility] Reason for Treatment: schizoaffective d/o  Prior Outpatient Therapy Prior Outpatient Therapy: Yes Prior Therapy Dates: until 7 mos ago Prior Therapy Facilty/Provider(s): Dr Tiajuana Am[facility] Reason for Treatment: med management  ADL Screening (condition at time of admission) Patient's cognitive ability adequate to safely complete daily activities?:  (unable to assess) Is the patient deaf or have difficulty hearing?:  (unable to assess) Does the patient have difficulty seeing, even when wearing glasses/contacts?:  (unable to assess) Does the patient have difficulty concentrating, remembering, or making decisions?:  (unable to assess) Patient able to express need for assistance with ADLs?:  (pt doesn't speak anymroe) Does the patient have difficulty dressing or bathing?:  (pt refuses to bathe) Independently performs ADLs?:  (wife says pt can perform ADLs but chooses not to) Does the patient have difficulty walking or climbing stairs?: No  Home Assistive Devices/Equipment Home Assistive Devices/Equipment: None    Abuse/Neglect Assessment (Assessment to be complete while patient is alone) Physical Abuse:  (unable to assess) Verbal Abuse:  (unable to assess) Sexual Abuse:  (  unable to assess) Exploitation of patient/patient's resources:  (unable to assess) Self-Neglect:  (per wife - pt won't groom or eat) Values / Beliefs Cultural Requests During Hospitalization: None Spiritual Requests During Hospitalization: None        Additional Information 1:1 In Past 12 Months?: No CIRT Risk: No Elopement Risk: No Does patient have medical clearance?:  (unknown as no provider note completed)     Disposition:   Disposition Initial Assessment Completed for this Encounter: Yes Disposition of Patient: Inpatient treatment program Jannifer Franklin MD accepts to geropsych - pt declined at [facility])  On Site Evaluation by:   Reviewed with Physician:    Thornell Sartorius 09/22/2013 7:39 PM

## 2013-09-22 NOTE — BHH Counselor (Signed)
Writer called and notified EDP Gwendolyn GrantWalden that there is no provider note for pt.  Stanley Cristalaroline Paige Zaylan Kissoon, [place] Assessment Counselor

## 2013-09-22 NOTE — ED Notes (Signed)
Per Sheriff-picked up at residence-family IVCd patient, not taking meds-paranoid, not eating, bathing, not willing to get help from family

## 2013-09-23 DIAGNOSIS — F259 Schizoaffective disorder, unspecified: Secondary | ICD-10-CM

## 2013-09-23 MED ORDER — OLANZAPINE 10 MG IM SOLR
5.0000 mg | Freq: Once | INTRAMUSCULAR | Status: DC
  Administered 2013-09-23: 5 mg via INTRAMUSCULAR

## 2013-09-23 MED ORDER — OLANZAPINE 10 MG IM SOLR: 10.0000 mg | Freq: Once | INTRAMUSCULAR | Status: DC

## 2013-09-23 MED ORDER — OLANZAPINE 10 MG IM SOLR
5.0000 mg | Freq: Every day | INTRAMUSCULAR | Status: DC
  Filled 2013-09-23: qty 10

## 2013-09-23 MED ORDER — POTASSIUM CHLORIDE 20 MEQ/15ML (10%) PO LIQD
20.0000 meq | Freq: Every day | ORAL | Status: DC
  Filled 2013-09-23: qty 15

## 2013-09-23 NOTE — Progress Notes (Signed)
Patient continues to not interact with staff. Patient continues to have very minimal oral intake. RN attempted to feed patient but patient just stared blankly ahead. RN obtained patient's vitals. NP aware of patient status. Will continue to monitor patient for safety.

## 2013-09-23 NOTE — Consult Note (Signed)
Patient seen, evaluated by me, treatment plan formulated by me. Second opinion in regards to patient's IVC completed. Patient is psychotic, is not eating or drinking and needs inpatient psychiatric care for stabilization

## 2013-09-23 NOTE — Progress Notes (Signed)
[facility] CL provided pt with a [employer] Card application, [employer] [facility]. Patient did not speak to CL while in room.

## 2013-09-23 NOTE — Progress Notes (Signed)
Patient having elective mutism. Patient refusing all medications and is unwilling/unable to interact with staff. RN attempted to give patient medications but patient only closed his mouth and looked away. Patient is eating only graham crackers and cheese and seems to drink little water. Notified NP of patient's medication refusal, eating/drinking habits, and blood pressure (150/98), awaiting orders now. Will continue to monitor patient for safety.

## 2013-09-23 NOTE — ED Notes (Signed)
Pt. Looking at contents of supper tray. Still has not eaten anything off tray. Pt. Encouraged to drink. No response from pt.

## 2013-09-23 NOTE — Consult Note (Signed)
[facility] Face-to-Face Psychiatry Consult   Reason for Consult:  Paranoid, not eating or taking medications Referring Physician: EDP  Stanley Watkins is an 63 y.o. male. Total Time spent with patient: 15 minutes  Assessment: AXIS I:  Schizoaffective Disorder AXIS II:  Deferred AXIS III:   Past Medical History  Diagnosis Date  . Diabetes mellitus without complication     pre diabetes  . Schizoaffective disorder   . Depression, major   . Hypertension     non complaint  . Dementia    AXIS IV:  other psychosocial or environmental problems, problems with access to health care services and problems with primary support group AXIS V:  21-30 behavior considerably influenced by delusions or hallucinations OR serious impairment in judgment, communication OR inability to function in almost all areas  Plan:  Recommend psychiatric Inpatient admission when medically cleared.  Dr. Dwyane Dee assessed and is in agreement with current treatment plan  Subjective:   Stanley Watkins is a 63 y.o. male patient admitted involuntarily by wife following period of not eating, bathing or taking medications. HPI:   Patient has a long history of mental illness and had been obtaining outpatient services until about 2 months ago.  Wife has noted a gradual decline in patient's functioning since this time with patient becoming less verbal and now refusing all adl's and oral intake of food.  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  Today patient in uncommunicative,  Continues to refuse intake of food, will eat bites of cracker.  Patient has refused oral medications.  Patient sitting on side of bed with head down, eyes downcast.      HPI Elements:   Location:  generalized. Quality:  acute. Severity:  severe. Timing:  constant. Duration:  exacerbation of chronic illness over past 2 months. Context:  stressors.  Past Psychiatric History: Past Medical  History  Diagnosis Date  . Diabetes mellitus without complication     pre diabetes  . Schizoaffective disorder   . Depression, major   . Hypertension     non complaint  . Dementia     reports that he has never smoked. He has never used smokeless tobacco. He reports that he does not drink alcohol. His drug history is not on file. Family History  Problem Relation Age of Onset  . Diabetes Mother   . Cancer Father   . Schizophrenia Maternal Aunt    Family History Substance Abuse: No Family Supports: Yes, List: (wife, son) Living Arrangements: Spouse/significant other;Children (wife & 21 yo son) Can pt return to current living arrangement?: Yes Abuse/Neglect [facility]) Physical Abuse:  (unable to assess) Verbal Abuse:  (unable to assess) Sexual Abuse:  (unable to assess) Allergies:  No Known Allergies  ACT Assessment Complete:  Yes:    Educational Status    Risk to Self: Risk to self Suicidal Ideation:  (unable to assess) Suicidal Intent:  (unable to assess) Is patient at risk for suicide?: No Suicidal Plan?:  (unable to assess) What has been your use of drugs/alcohol within the last 12 months?: n/a Previous Attempts/Gestures: No (per wife) Triggers for Past Attempts:  (n/a) Intentional Self Injurious Behavior:  (unable to assess) Family Suicide History: No (maternal side has mental illness) Recent stressful life event(s):  (unable to assess) Depression Symptoms:  (unable to assess) Substance abuse history and/or treatment for substance abuse?: No Suicide prevention information given to non-admitted patients: Not applicable  Risk to Others:  Risk to Others Homicidal Ideation:  (unable to assess) Thoughts of Harm to Others:  (unable to assess) Current Homicidal Intent:  (unable to assess) Current Homicidal Plan:  (unable to assess) Access to Homicidal Means: No History of harm to others?: No Assessment of Violence: None Noted Violent Behavior Description:  (wife sts pt not  violent) Does patient have access to weapons?: No Criminal Charges Pending?: No Does patient have a court date: No  Abuse: Abuse/Neglect Assessment (Assessment to be complete while patient is alone) Physical Abuse:  (unable to assess) Verbal Abuse:  (unable to assess) Sexual Abuse:  (unable to assess) Exploitation of patient/patient's resources:  (unable to assess) Self-Neglect:  (per wife - pt won't groom or eat)  Prior Inpatient Therapy: Prior Inpatient Therapy Prior Inpatient Therapy: Yes Prior Therapy Dates: Sept 2014 Prior Therapy Facilty/Provider(s): [facility] Reason for Treatment: schizoaffective d/o  Prior Outpatient Therapy: Prior Outpatient Therapy Prior Outpatient Therapy: Yes Prior Therapy Dates: until 7 mos ago Prior Therapy Facilty/Provider(s): Dr Dustin Flock Reason for Treatment: med management  Additional Information: Additional Information 1:1 In Past 12 Months?: No CIRT Risk: No Elopement Risk: No Does patient have medical clearance?:  (unknown as no provider note completed)                  Objective: Blood pressure 150/98, pulse 72, temperature 96.8 F (36 C), temperature source Axillary, resp. rate 16, SpO2 100.00%.There is no weight on file to calculate BMI. Results for orders placed during the hospital encounter of 09/22/13 (from the past 72 hour(s))  URINE RAPID DRUG SCREEN (HOSP PERFORMED)     Status: None   Collection Time    09/22/13  1:02 PM      Result Value Ref Range   Opiates NONE DETECTED  NONE DETECTED   Cocaine NONE DETECTED  NONE DETECTED   Benzodiazepines NONE DETECTED  NONE DETECTED   Amphetamines NONE DETECTED  NONE DETECTED   Tetrahydrocannabinol NONE DETECTED  NONE DETECTED   Barbiturates NONE DETECTED  NONE DETECTED   Comment:            DRUG SCREEN FOR MEDICAL PURPOSES     ONLY.  IF CONFIRMATION IS NEEDED     FOR ANY PURPOSE, NOTIFY LAB     WITHIN 5 DAYS.                LOWEST DETECTABLE LIMITS     FOR URINE  DRUG SCREEN     Drug Class       Cutoff (ng/mL)     Amphetamine      1000     Barbiturate      200     Benzodiazepine   568     Tricyclics       127     Opiates          300     Cocaine          300     THC              50  CBC     Status: Abnormal   Collection Time    09/22/13  1:06 PM      Result Value Ref Range   WBC 4.4  4.0 - 10.5 K/uL   RBC 5.84 (*) 4.22 - 5.81 MIL/uL   Hemoglobin 16.0  13.0 - 17.0 g/dL   HCT 46.6  39.0 - 52.0 %   MCV 79.8  78.0 - 100.0 fL  MCH 27.4  26.0 - 34.0 pg   MCHC 34.3  30.0 - 36.0 g/dL   RDW 15.3  11.5 - 15.5 %   Platelets 170  150 - 400 K/uL   Comment: REPEATED TO VERIFY  COMPREHENSIVE METABOLIC PANEL     Status: Abnormal   Collection Time    09/22/13  1:06 PM      Result Value Ref Range   Sodium 139  137 - 147 mEq/L   Potassium 3.3 (*) 3.7 - 5.3 mEq/L   Chloride 101  96 - 112 mEq/L   CO2 23  19 - 32 mEq/L   Glucose, Bld 115 (*) 70 - 99 mg/dL   BUN 5 (*) 6 - 23 mg/dL   Creatinine, Ser 0.91  0.50 - 1.35 mg/dL   Calcium 9.9  8.4 - 10.5 mg/dL   Total Protein 8.5 (*) 6.0 - 8.3 g/dL   Albumin 4.3  3.5 - 5.2 g/dL   AST 14  0 - 37 U/L   ALT 8  0 - 53 U/L   Alkaline Phosphatase 86  39 - 117 U/L   Total Bilirubin 0.6  0.3 - 1.2 mg/dL   GFR calc non Af Amer 90 (*) >90 mL/min   GFR calc Af Amer >90  >90 mL/min   Comment: (NOTE)     The eGFR has been calculated using the CKD EPI equation.     This calculation has not been validated in all clinical situations.     eGFR's persistently <90 mL/min signify possible Chronic Kidney     Disease.   Anion gap 15  5 - 15  ETHANOL     Status: None   Collection Time    09/22/13  1:06 PM      Result Value Ref Range   Alcohol, Ethyl (B) <11  0 - 11 mg/dL   Comment:            LOWEST DETECTABLE LIMIT FOR     SERUM ALCOHOL IS 11 mg/dL     FOR MEDICAL PURPOSES ONLY   Labs are reviewed and are pertinent for hypokalemia  Current Facility-Administered Medications  Medication Dose Route Frequency Provider  Last Rate Last Dose  . acetaminophen (TYLENOL) tablet 650 mg  650 mg Oral Q4H PRN Virgel Manifold, MD      . alum & mag hydroxide-simeth (MAALOX/MYLANTA) 200-200-20 MG/5ML suspension 30 mL  30 mL Oral PRN Virgel Manifold, MD      . ibuprofen (ADVIL,MOTRIN) tablet 600 mg  600 mg Oral Q8H PRN Virgel Manifold, MD      . lisinopril (PRINIVIL,ZESTRIL) tablet 5 mg  5 mg Oral Daily Virgel Manifold, MD      . LORazepam (ATIVAN) tablet 0.5 mg  0.5 mg Oral BID Virgel Manifold, MD      . ondansetron [facility]) tablet 4 mg  4 mg Oral Q8H PRN Virgel Manifold, MD       No current outpatient prescriptions on file.    Psychiatric Specialty Exam:     Blood pressure 150/98, pulse 72, temperature 96.8 F (36 C), temperature source Axillary, resp. rate 16, SpO2 100.00%.There is no weight on file to calculate BMI.  General Appearance: Disheveled  Eye Contact::  Absent  Speech:  selectively mute  Volume:  not assessed  Mood:  Depressed  Affect:  Depressed flat  Thought Process:  unable to asses as patient is selectively mute  Orientation:  NA  Thought Content:  NA  Suicidal Thoughts:  No  Homicidal Thoughts:  No  Memory:  NA  Judgement:  Poor  Insight:  Lacking  Psychomotor Activity:  Decreased  Concentration:  NA  Recall:  NA  Fund of Knowledge:NA  Language: Poor  Akathisia:  NA  Handed:  Right  AIMS (if indicated):     Assets:  Financial Resources/Insurance Resilience  Sleep:      Musculoskeletal: Strength & Muscle Tone: within normal limits Gait & Station: normal Patient leans: N/A  Treatment Plan Summary: Daily contact with patient to assess and evaluate symptoms and progress in treatment Medication management  Patient to be admitted to inpatient psychiatric unit for stabilization of mood/thought processes.  Will start potassium for hypokalemia, and clonidine patch for elevated blood pressure.   Waylan Boga 09/23/2013 10:27 AM

## 2013-09-23 NOTE — Progress Notes (Signed)
Patient discharged to sheriff custody per MD order. Patient to be transported to [facility]. Patient did not interact with staff on discharge/transfer and did not show understanding of transfer instructions and refused to sign paperwork. RN attempted education but patient has little insight due to mental status. Belongings were given to sheriff. Patient ambulatory on discharge.

## 2013-09-23 NOTE — ED Notes (Signed)
Pt. Up to restroom. 

## 2013-09-23 NOTE — BH Assessment (Signed)
[facility] Assessment Progress Note   09/23/13 Accepted to [facility] by Dr. [place]. Will be transported by law enforcement.        

## 2013-09-29 NOTE — ED Provider Notes (Signed)
CSN: [id number]     Arrival date & time 09/22/13  1230 History   First MD Initiated Contact with Patient 09/22/13 1253     Chief Complaint  Patient presents with  . IVC/paranoid      (Consider location/radiation/quality/duration/timing/severity/associated sxs/prior Treatment) HPI  63 year old male brought in for evaluation after a slow, but steady decline over the past couple months. Patient has a history of mental illness. Has been off his medications for approximately 2 months. For the same time period he has become more withdrawn. Is not taking care of himself like he should be. Not eating. Refusing to eat. Withdrawn. This information is from report. Patient is nonverbal. Stairs downward and does not respond to my questioning. He is following commands some such as taking deep breaths when asked to when I was auscultating his lungs.   Past Medical History  Diagnosis Date  . Diabetes mellitus without complication     pre diabetes  . Schizoaffective disorder   . Depression, major   . Hypertension     non complaint  . Dementia    History reviewed. No pertinent past surgical history. Family History  Problem Relation Age of Onset  . Diabetes Mother   . Cancer Father   . Schizophrenia Maternal Aunt    History  Substance Use Topics  . Smoking status: Never Smoker   . Smokeless tobacco: Never Used  . Alcohol Use: No    Review of Systems  Level V caveat because patient is nonverbal.  Allergies  Review of patient's allergies indicates no known allergies.  Home Medications   Prior to Admission medications   Not on File   BP 136/95  Pulse 78  Temp(Src) 96.8 F (36 C) (Axillary)  Resp 16  SpO2 98% Physical Exam  Nursing note and vitals reviewed. Constitutional: He appears well-developed and well-nourished. No distress.  HENT:  Head: Normocephalic and atraumatic.  Eyes: Conjunctivae are normal. Right eye exhibits no discharge. Left eye exhibits no discharge.  Neck:  Neck supple.  Cardiovascular: Normal rate, regular rhythm and normal heart sounds.  Exam reveals no gallop and no friction rub.   No murmur heard. Pulmonary/Chest: Effort normal and breath sounds normal. No respiratory distress.  Abdominal: Soft. He exhibits no distension. There is no tenderness.  Musculoskeletal: He exhibits no edema and no tenderness.  Neurological: He is alert.  Skin: Skin is warm and dry.  Psychiatric:  Nonverbal. Flat affect. Staring down at the floor.    ED Course  Procedures (including critical care time) Labs Review Labs Reviewed  CBC - Abnormal; Notable for the following:    RBC 5.84 (*)    All other components within normal limits  COMPREHENSIVE METABOLIC PANEL - Abnormal; Notable for the following:    Potassium 3.3 (*)    Glucose, Bld 115 (*)    BUN 5 (*)    Total Protein 8.5 (*)    GFR calc non Af Amer 90 (*)    All other components within normal limits  URINE CULTURE  URINE RAPID DRUG SCREEN (HOSP PERFORMED)  ETHANOL    Imaging Review No results found.   EKG Interpretation None      MDM   Final diagnoses:  Schizoaffective disorder, depressive type    63 year old male with a history of psychiatric illness is currently not taking his medication more taking care of his ADLs. He is in no distress, but I feel that he needs psychiatric stabilization. Despite report that he has not been needing,  he does not appear to be dehydrated his labs are fairly unremarkable. [facility] been medically cleared at this time for further psychiatric evaluation and stabilization.   Raeford RazorStephen Manreet Kiernan, MD 09/29/13 1102

## 2014-02-11 ENCOUNTER — Emergency Department (HOSPITAL_COMMUNITY): Payer: 59

## 2014-02-11 ENCOUNTER — Emergency Department (HOSPITAL_COMMUNITY)
Admission: EM | Admit: 2014-02-11 | Discharge: 2014-02-11 | Disposition: A | Discharge status: Home / Self Care | Payer: 59 | Attending: Emergency Medicine | Admitting: Emergency Medicine

## 2014-02-11 ENCOUNTER — Encounter (HOSPITAL_COMMUNITY): Payer: Self-pay | Admitting: Emergency Medicine

## 2014-02-11 DIAGNOSIS — R000 Tachycardia, unspecified: Secondary | ICD-10-CM | POA: Diagnosis not present

## 2014-02-11 DIAGNOSIS — F329 Major depressive disorder, single episode, unspecified: Secondary | ICD-10-CM | POA: Insufficient documentation

## 2014-02-11 DIAGNOSIS — R6884 Jaw pain: Secondary | ICD-10-CM | POA: Diagnosis not present

## 2014-02-11 DIAGNOSIS — E119 Type 2 diabetes mellitus without complications: Secondary | ICD-10-CM | POA: Diagnosis not present

## 2014-02-11 DIAGNOSIS — I10 Essential (primary) hypertension: Secondary | ICD-10-CM | POA: Diagnosis not present

## 2014-02-11 DIAGNOSIS — K088 Other specified disorders of teeth and supporting structures: Secondary | ICD-10-CM | POA: Diagnosis present

## 2014-02-11 DIAGNOSIS — Z79899 Other long term (current) drug therapy: Secondary | ICD-10-CM | POA: Insufficient documentation

## 2014-02-11 DIAGNOSIS — F0390 Unspecified dementia without behavioral disturbance: Secondary | ICD-10-CM | POA: Insufficient documentation

## 2014-02-11 LAB — CBC WITH DIFFERENTIAL/PLATELET
BASOS ABS: 0 10*3/uL (ref 0.0–0.1)
Basophils Relative: 0 % (ref 0–1)
Eosinophils Absolute: 0.1 10*3/uL (ref 0.0–0.7)
Eosinophils Relative: 2 % (ref 0–5)
HCT: 41.9 % (ref 39.0–52.0)
HEMOGLOBIN: 14.1 g/dL (ref 13.0–17.0)
Lymphocytes Relative: 28 % (ref 12–46)
Lymphs Abs: 1.8 10*3/uL (ref 0.7–4.0)
MCH: 28.3 pg (ref 26.0–34.0)
MCHC: 33.7 g/dL (ref 30.0–36.0)
MCV: 84 fL (ref 78.0–100.0)
Monocytes Absolute: 0.4 10*3/uL (ref 0.1–1.0)
Monocytes Relative: 6 % (ref 3–12)
NEUTROS ABS: 4.1 10*3/uL (ref 1.7–7.7)
NEUTROS PCT: 64 % (ref 43–77)
PLATELETS: 147 10*3/uL — AB (ref 150–400)
RBC: 4.99 MIL/uL (ref 4.22–5.81)
RDW: 15.2 % (ref 11.5–15.5)
WBC: 6.4 10*3/uL (ref 4.0–10.5)

## 2014-02-11 LAB — COMPREHENSIVE METABOLIC PANEL
ALBUMIN: 3.9 g/dL (ref 3.5–5.2)
ALK PHOS: 69 U/L (ref 39–117)
ALT: 19 U/L (ref 0–53)
AST: 16 U/L (ref 0–37)
Anion gap: 14 (ref 5–15)
BUN: 23 mg/dL (ref 6–23)
CHLORIDE: 103 meq/L (ref 96–112)
CO2: 23 mEq/L (ref 19–32)
Calcium: 9.4 mg/dL (ref 8.4–10.5)
Creatinine, Ser: 1.31 mg/dL (ref 0.50–1.35)
GFR calc Af Amer: 66 mL/min — ABNORMAL LOW (ref >90)
GFR calc non Af Amer: 57 mL/min — ABNORMAL LOW (ref >90)
Glucose, Bld: 140 mg/dL — ABNORMAL HIGH (ref 70–99)
POTASSIUM: 4.3 meq/L (ref 3.7–5.3)
SODIUM: 140 meq/L (ref 137–147)
Total Protein: 7.8 g/dL (ref 6.0–8.3)

## 2014-02-11 LAB — I-STAT TROPONIN, ED: TROPONIN I, POC: 0.01 ng/mL (ref 0.00–0.08)

## 2014-02-11 MED ORDER — CLINDAMYCIN HCL 300 MG PO CAPS: 300.0000 mg | ORAL_CAPSULE | Freq: Four times a day (QID) | ORAL | Status: DC

## 2014-02-11 MED ORDER — CLINDAMYCIN PHOSPHATE 900 MG/50ML IV SOLN
900.0000 mg | Freq: Once | INTRAVENOUS | Status: AC
  Administered 2014-02-11: 900 mg via INTRAVENOUS

## 2014-02-11 MED ORDER — SODIUM CHLORIDE 0.9 % IV BOLUS (SEPSIS)
1000.0000 mL | Freq: Once | INTRAVENOUS | Status: AC
  Administered 2014-02-11: 1000 mL via INTRAVENOUS

## 2014-02-11 NOTE — ED Notes (Signed)
Patient transported to X-ray 

## 2014-02-11 NOTE — ED Notes (Signed)
Pt arrived to the ED with a complaint of dental pain.  Pt has a spot where a tooth is absent that is causing him pain.  While in triage pt was noted to have bradycardia.  Pt was questioned about said and pt stated he felt weak and lethargic for a few days.  Pt also states he has had periods of lightheadedness upon standing intermittently.

## 2014-02-11 NOTE — Discharge Instructions (Signed)
Return to the Emergency Department immediately if you develop chest pain, SOB, left arm pain, dizziness, or numbness.

## 2014-02-11 NOTE — ED Provider Notes (Signed)
CSN: [id number]     Arrival date & time 02/11/14  0253 History   First MD Initiated Contact with Patient 02/11/14 [phone]     Chief Complaint  Patient presents with  . Dental Pain  . Bradycardia     (Consider location/radiation/quality/duration/timing/severity/associated sxs/prior Treatment) HPI [name] is a 63 year old male with past medical history of hypertension, diabetes, schizoaffective disorder, depression, dementia who presents with several hours of dental pain. Patient reports an acute onset of left lower pain in his jaw which started around 11 PM. Patient states he was unable to sleep all night due to the constant pain in his jaw. Patient states he has had dental problems in the past and this feels similar. There is noted in triage that patient's heart rate was in the 40s. When questioned about patient reported mild lightheadedness for the past several days. On exam and interview with me patient states that he has noticed that he feels like he is going to pass out when standing up occasionally for the past 2-3 days. Patient denies associated chest pain, shortness of breath, diaphoresis, nausea, vomiting, fever, trismus, oral swelling, dysphagia.   Past Medical History  Diagnosis Date  . Diabetes mellitus without complication     pre diabetes  . Schizoaffective disorder   . Depression, major   . Hypertension     non complaint  . Dementia    History reviewed. No pertinent past surgical history. Family History  Problem Relation Age of Onset  . Diabetes Mother   . Cancer Father   . Schizophrenia Maternal Aunt    History  Substance Use Topics  . Smoking status: Never Smoker   . Smokeless tobacco: Never Used  . Alcohol Use: No    Review of Systems  Constitutional: Negative for fever.  HENT: Negative for sore throat, trouble swallowing and voice change.   Eyes: Negative for visual disturbance.  Respiratory: Negative for shortness of breath.   Cardiovascular:  Negative for chest pain.       Jaw pain  Gastrointestinal: Negative for nausea, vomiting and abdominal pain.  Genitourinary: Negative for dysuria.  Musculoskeletal: Negative for neck pain.  Skin: Negative for rash.  Neurological: Negative for dizziness, weakness and numbness.  Psychiatric/Behavioral: Negative.       Allergies  Review of patient's allergies indicates no known allergies.  Home Medications   Prior to Admission medications   Medication Sig Start Date End Date Taking? Authorizing Provider  ibuprofen (ADVIL,MOTRIN) 200 MG tablet Take 800 mg by mouth every 6 (six) hours as needed for moderate pain.   Yes Historical Provider, MD  lisinopril (PRINIVIL,ZESTRIL) 5 MG tablet Take 5 mg by mouth daily. 10/26/13 10/26/14 Yes Historical Provider, MD  risperiDONE (RISPERDAL M-TABS) 4 MG disintegrating tablet Take 4 mg by mouth at bedtime. 10/26/13  Yes Historical Provider, MD  sertraline (ZOLOFT) 100 MG tablet Take 100 mg by mouth daily. 10/26/13 10/26/14 Yes Historical Provider, MD  simvastatin (ZOCOR) 20 MG tablet Take 20 mg by mouth at bedtime. 01/12/14  Yes Historical Provider, MD   BP 132/88 mmHg  Pulse 110  Temp(Src) 97.4 F (36.3 C) (Oral)  Resp 16  SpO2 97% Physical Exam  Constitutional: He is oriented to person, place, and time. He appears well-developed and well-nourished. No distress.  HENT:  Head: Normocephalic and atraumatic.  Mouth/Throat: Uvula is midline, oropharynx is clear and moist and mucous membranes are normal. No trismus in the jaw. No dental abscesses or uvula swelling. No oropharyngeal exudate, posterior oropharyngeal  erythema or tonsillar abscesses.  No swelling, erythema, drainage, discharge to gums. No gross abscess. Gumline intact, floor of mouth soft. No signs of cellulitis. Soft, nontender, nonerythematous growth noted under tongue.  Eyes: Right eye exhibits no discharge. Left eye exhibits no discharge. No scleral icterus.  Neck: Normal range of motion.   Cardiovascular: Normal rate, regular rhythm and normal heart sounds.   No murmur heard. Pulmonary/Chest: Effort normal and breath sounds normal. No accessory muscle usage. No tachypnea. No respiratory distress.  Abdominal: Soft. Normal appearance and bowel sounds are normal. There is no tenderness.  Musculoskeletal: Normal range of motion. He exhibits no edema or tenderness.  Neurological: He is alert and oriented to person, place, and time. No cranial nerve deficit. Coordination normal.  Skin: Skin is warm and dry. No rash noted. He is not diaphoretic.  Psychiatric: He has a normal mood and affect.  Nursing note and vitals reviewed.   ED Course  Procedures (including critical care time) Labs Review Labs Reviewed  CBC WITH DIFFERENTIAL - Abnormal; Notable for the following:    Platelets 147 (*)    All other components within normal limits  COMPREHENSIVE METABOLIC PANEL - Abnormal; Notable for the following:    Glucose, Bld 140 (*)    Total Bilirubin <0.2 (*)    GFR calc non Af Amer 57 (*)    GFR calc Af Amer 66 (*)    All other components within normal limits  I-STAT TROPOININ, ED    Imaging Review Dg Chest 2 View  02/11/2014   CLINICAL DATA:  Acute onset of bradycardia, weakness and lethargy. Lightheadedness. Initial encounter.  EXAM: CHEST  2 VIEW  COMPARISON:  Chest radiograph performed 04/28/2012  FINDINGS: The lungs are well-aerated. Mild right basilar airspace opacity raises concern for mild pneumonia. There is no evidence of pleural effusion or pneumothorax.  The heart is normal in size; the mediastinal contour is within normal limits. No acute osseous abnormalities are seen.  IMPRESSION: Mild right basilar airspace opacity raises question for mild pneumonia.   Electronically Signed   By: Roanna RaiderJeffery  Chang M.D.   On: 02/11/2014 05:41     EKG Interpretation None      MDM   Final diagnoses:  Jaw pain    No obvious source of dental pain on exam, no tenderness. Patient  asymptomatic with no pain in his mouth in the ER. Based on the lack of any obvious abscess, or cause of dental pain, or tenderness to area where patient was experiencing his pain, I am concerned that the pain patient was having was jaw pain which may be cardiac in nature. Patient not expressing any other anginal symptoms, however we will rule out patient for ACS. Patient tachycardic on exam at 1:15 120, he is well appearing, and asleep in the room. Patient is in no acute distress, and asymptomatic during my exam.  Orthostatic vital signs remarkable for increase in heart rate from lying to standing with heart rate going from 100 to 130. Patient ACS workup negative this far. Troponin negative. No leukocytosis, anemia. Renal function within normal limits. Chest radiograph unremarkable for an impression of a mild right basilar airspace opacity with question for mild pneumonia. In the absence of patient having any cough or shortness of breath, I doubt patient's symptoms are being caused by pneumonia, however with patient's possible dental pain and questionable pneumonia on x-ray, we'll treat patient with IV clindamycin which may provide coverage for both areas. Patient continues to be tachycardic at  115 on reexamination. Patient well-appearing and in no acute distress, sleeping in the room on each reexamination.  We will place patient on a liter of fluids with the clindamycin in hopes to help resolve his tachycardia. Patient asked again where his pain in his mouth was, and patient is pointing at the inside of his gumline. Although there is no tenderness on exam, this specific pain is less concerning to me for ACS or anginal equivalent of jaw pain. HEART score 2.  Wells criteria 1.5.  Patient continues denied shortness of breath or chest pain, nausea.    Patient signed out to Santiago Glad, PA-C with plan to follow-up after patient's fluid bolus to reassess his tachycardia. Patient is to be discharged home if  rehydration helps improve his symptoms and his tachycardia.  Signed,  Ladona Mow, PA-C 8:32 AM    Monte Fantasia, PA-C 02/11/14 1610  Ward Givens, MD 02/11/14 [phone]

## 2014-02-11 NOTE — ED Provider Notes (Signed)
Patient reports he started getting pain in his left gum last night about 10:30 PM. He states the pain has been there constantly all night long. He relates he had similar pain 4 years ago and went to see a dentist but no cause was found. Patient is noted to have no molar teeth in the area where he is having the pain. His intact teeth are nontender to stressing. I do not feel any masses when I palpate his mandible and soft tissue around the mandible. He states his pain is not over the parotid gland. He did have swelling in that area at home although there is no obvious swelling now.  Medical screening examination/treatment/procedure(s) were conducted as a shared visit with non-physician practitioner(s) and myself.  I personally evaluated the patient during the encounter.   EKG Interpretation None       Devoria AlbeIva Armarion Greek, MD, Armando GangFACEP   Ward GivensIva L Phung Kotas, MD 02/11/14 [phone]

## 2014-02-11 NOTE — ED Provider Notes (Signed)
7:25 AM Patient signed out to me by Jinny SandersJoseph Mintz, PA-C at shift change.  Patient presents today with left lower jaw pain.  Pain has been present since 11 PM last evening.  Labs unremarkable.  Troponin negative.  EKG unremarkable.  Patient found to be tachycardic in the ED.  Patient is currently getting IVF and IV Clindamycin.  Plan is for the patient to be discharged once he has completed the Clindamycin and the IVF.  Plan is for the patient to be discharged home on Clindamycin 300 mg QID.  9:05 AM Patient has completed his IVF and Clindamycin.  Heart rate is 97 upon arrival in the patient's room.  Patient stable for discharge.  Return precautions given.  Santiago GladHeather Judea Riches, PA-C 02/11/14 1002  Enid SkeensJoshua M Zavitz, MD 02/11/14 [phone]

## 2014-02-11 NOTE — ED Provider Notes (Signed)
Date: 02/11/2014  Rate: 103  Rhythm: sinus tachycardia and premature atrial contractions (PAC)  QRS Axis: left  Intervals: normal  ST/T Wave abnormalities: normal  Conduction Disutrbances:left anterior fascicular block  Narrative Interpretation:   Old EKG Reviewed: none available   Devoria AlbeIva Margreat Widener, MD, Armando GangFACEP     Ward GivensIva L Shalina Norfolk, MD 02/11/14 [phone]

## 2014-04-09 ENCOUNTER — Encounter (HOSPITAL_COMMUNITY): Payer: Self-pay

## 2014-04-09 ENCOUNTER — Emergency Department (HOSPITAL_COMMUNITY): Payer: 59

## 2014-04-09 ENCOUNTER — Emergency Department (HOSPITAL_COMMUNITY)
Admission: EM | Admit: 2014-04-09 | Discharge: 2014-04-09 | Discharge status: Against Medical Advice | Payer: 59 | Attending: Emergency Medicine | Admitting: Emergency Medicine

## 2014-04-09 DIAGNOSIS — F259 Schizoaffective disorder, unspecified: Secondary | ICD-10-CM | POA: Insufficient documentation

## 2014-04-09 DIAGNOSIS — R109 Unspecified abdominal pain: Secondary | ICD-10-CM | POA: Diagnosis present

## 2014-04-09 DIAGNOSIS — M549 Dorsalgia, unspecified: Secondary | ICD-10-CM | POA: Diagnosis not present

## 2014-04-09 DIAGNOSIS — I10 Essential (primary) hypertension: Secondary | ICD-10-CM | POA: Diagnosis not present

## 2014-04-09 DIAGNOSIS — F329 Major depressive disorder, single episode, unspecified: Secondary | ICD-10-CM | POA: Insufficient documentation

## 2014-04-09 DIAGNOSIS — E119 Type 2 diabetes mellitus without complications: Secondary | ICD-10-CM | POA: Diagnosis not present

## 2014-04-09 DIAGNOSIS — F0390 Unspecified dementia without behavioral disturbance: Secondary | ICD-10-CM | POA: Insufficient documentation

## 2014-04-09 DIAGNOSIS — Z79899 Other long term (current) drug therapy: Secondary | ICD-10-CM | POA: Insufficient documentation

## 2014-04-09 DIAGNOSIS — R1033 Periumbilical pain: Secondary | ICD-10-CM | POA: Diagnosis not present

## 2014-04-09 LAB — COMPREHENSIVE METABOLIC PANEL
ALT: 23 U/L (ref 0–53)
AST: 19 U/L (ref 0–37)
Albumin: 3.6 g/dL (ref 3.5–5.2)
Alkaline Phosphatase: 61 U/L (ref 39–117)
Anion gap: 4 — ABNORMAL LOW (ref 5–15)
BUN: 12 mg/dL (ref 6–23)
CO2: 28 mmol/L (ref 19–32)
Calcium: 9 mg/dL (ref 8.4–10.5)
Chloride: 108 mmol/L (ref 96–112)
Creatinine, Ser: 1.05 mg/dL (ref 0.50–1.35)
GFR calc Af Amer: 86 mL/min — ABNORMAL LOW (ref >90)
GFR calc non Af Amer: 74 mL/min — ABNORMAL LOW (ref >90)
Glucose, Bld: 97 mg/dL (ref 70–99)
Potassium: 4.1 mmol/L (ref 3.5–5.1)
Sodium: 140 mmol/L (ref 135–145)
Total Bilirubin: 0.6 mg/dL (ref 0.3–1.2)
Total Protein: 6.9 g/dL (ref 6.0–8.3)

## 2014-04-09 LAB — CBC WITH DIFFERENTIAL/PLATELET
Basophils Absolute: 0 10*3/uL (ref 0.0–0.1)
Basophils Relative: 1 % (ref 0–1)
Eosinophils Absolute: 0.2 10*3/uL (ref 0.0–0.7)
Eosinophils Relative: 4 % (ref 0–5)
HCT: 44.2 % (ref 39.0–52.0)
Hemoglobin: 14.8 g/dL (ref 13.0–17.0)
Lymphocytes Relative: 33 % (ref 12–46)
Lymphs Abs: 1.5 10*3/uL (ref 0.7–4.0)
MCH: 27.9 pg (ref 26.0–34.0)
MCHC: 33.5 g/dL (ref 30.0–36.0)
MCV: 83.4 fL (ref 78.0–100.0)
Monocytes Absolute: 0.5 10*3/uL (ref 0.1–1.0)
Monocytes Relative: 10 % (ref 3–12)
Neutro Abs: 2.4 10*3/uL (ref 1.7–7.7)
Neutrophils Relative %: 53 % (ref 43–77)
Platelets: 142 10*3/uL — ABNORMAL LOW (ref 150–400)
RBC: 5.3 MIL/uL (ref 4.22–5.81)
RDW: 14.8 % (ref 11.5–15.5)
WBC: 4.6 10*3/uL (ref 4.0–10.5)

## 2014-04-09 LAB — LIPASE, BLOOD: Lipase: 30 U/L (ref 11–59)

## 2014-04-09 NOTE — ED Notes (Signed)
Pt refuses to sign signature pad for AMA.  Lawyer, PA talked with pt.  Pt advised of risks of leaving AMA.  This RN signed the AMA form as a witness.

## 2014-04-09 NOTE — ED Notes (Signed)
Pt c/o "hurting on the inside".  When asked to be more specific pt states back pain and stomach pain x 1 week.  Nothing makes the pain better or worse.  Pt is alert and oriented.

## 2014-04-09 NOTE — ED Provider Notes (Signed)
CSN: [id number]     Arrival date & time 04/09/14  1019 History   First MD Initiated Contact with Patient 04/09/14 1100     Chief Complaint  Patient presents with  . Back Pain  . Abdominal Pain     (Consider location/radiation/quality/duration/timing/severity/associated sxs/prior Treatment) HPI Patient presents to the emergency department with abdominal pain that started about 10 days ago.  The patient states that he is hurting in his mid abdomen and around both sides to his back.  The patient states that he has not had this pain previously.  The patient states that he does not have a primary care doctor that he follows up with.  He states that he does not have any chest pain, shortness of breath, nausea, vomiting, diarrhea, weakness, dizziness, hematemesis, bloody stool, rash, fever, cough, runny nose, sore throat, dysuria, incontinence, or syncope.  The patient states that he did not take any medications prior to arrival for his symptoms.  The patient states that nothing seems make his condition, better or worse. Past Medical History  Diagnosis Date  . Diabetes mellitus without complication     pre diabetes  . Schizoaffective disorder   . Depression, major   . Hypertension     non complaint  . Dementia    History reviewed. No pertinent past surgical history. Family History  Problem Relation Age of Onset  . Diabetes Mother   . Cancer Father   . Schizophrenia Maternal Aunt    History  Substance Use Topics  . Smoking status: Never Smoker   . Smokeless tobacco: Never Used  . Alcohol Use: No    Review of Systems  All other systems negative except as documented in the HPI. All pertinent positives and negatives as reviewed in the HPI.  Allergies  Review of patient's allergies indicates no known allergies.  Home Medications   Prior to Admission medications   Medication Sig Start Date End Date Taking? Authorizing Provider  lisinopril (PRINIVIL,ZESTRIL) 5 MG tablet Take 5 mg by  mouth daily. 10/26/13 10/26/14 Yes Historical Provider, MD  risperiDONE (RISPERDAL M-TABS) 4 MG disintegrating tablet Take 4 mg by mouth at bedtime. 10/26/13  Yes Historical Provider, MD  clindamycin (CLEOCIN) 300 MG capsule Take 1 capsule (300 mg total) by mouth 4 (four) times daily. Patient not taking: Reported on 04/09/2014 02/11/14   Santiago GladHeather Laisure, PA-C  ibuprofen (ADVIL,MOTRIN) 200 MG tablet Take 800 mg by mouth every 6 (six) hours as needed for moderate pain.    Historical Provider, MD   BP 122/83 mmHg  Pulse 94  Temp(Src) 98.2 F (36.8 C) (Oral)  Resp 29  SpO2 97% Physical Exam  Constitutional: He is oriented to person, place, and time. He appears well-developed and well-nourished. No distress.  HENT:  Head: Normocephalic and atraumatic.  Eyes: Pupils are equal, round, and reactive to light.  Neck: Normal range of motion. Neck supple.  Cardiovascular: Normal rate, regular rhythm and normal heart sounds.  Exam reveals no gallop and no friction rub.   No murmur heard. Pulmonary/Chest: Effort normal and breath sounds normal. No respiratory distress.  Abdominal: Soft. Bowel sounds are normal. He exhibits no distension. There is no tenderness. There is no rebound and no guarding.  Musculoskeletal: He exhibits no edema.  Neurological: He is alert and oriented to person, place, and time. He exhibits normal muscle tone. Coordination normal.  Skin: Skin is warm and dry. No rash noted. No erythema.  Nursing note and vitals reviewed.   ED Course  Procedures (including critical care time) Labs Review Labs Reviewed  COMPREHENSIVE METABOLIC PANEL - Abnormal; Notable for the following:    GFR calc non Af Amer 74 (*)    GFR calc Af Amer 86 (*)    Anion gap 4 (*)    All other components within normal limits  CBC WITH DIFFERENTIAL/PLATELET - Abnormal; Notable for the following:    Platelets 142 (*)    All other components within normal limits  LIPASE, BLOOD  URINALYSIS, ROUTINE W REFLEX  MICROSCOPIC    The patient does not want to.  They for the CT scan of his abdomen.  The patient states he needs to go home.  I have advised him that he could have a risk of further complications worsening or death.  Patient voiced an understanding.      Carlyle Dolly, PA-C 04/09/14 1352  Gerhard Munch, MD 04/09/14 [phone]

## 2014-04-10 ENCOUNTER — Encounter (HOSPITAL_COMMUNITY): Payer: Self-pay

## 2014-04-10 ENCOUNTER — Emergency Department (INDEPENDENT_AMBULATORY_CARE_PROVIDER_SITE_OTHER): Payer: 59

## 2014-04-10 ENCOUNTER — Emergency Department (INDEPENDENT_AMBULATORY_CARE_PROVIDER_SITE_OTHER)
Admission: EM | Admit: 2014-04-10 | Discharge: 2014-04-10 | Disposition: A | Discharge status: Home / Self Care | Payer: 59 | Source: Home / Self Care | Attending: Family Medicine | Admitting: Family Medicine

## 2014-04-10 DIAGNOSIS — K5901 Slow transit constipation: Secondary | ICD-10-CM

## 2014-04-10 LAB — POCT URINALYSIS DIP (DEVICE)
BILIRUBIN URINE: NEGATIVE
GLUCOSE, UA: NEGATIVE mg/dL
Hgb urine dipstick: NEGATIVE
Ketones, ur: NEGATIVE mg/dL
LEUKOCYTES UA: NEGATIVE
NITRITE: NEGATIVE
PROTEIN: NEGATIVE mg/dL
SPECIFIC GRAVITY, URINE: 1.025 (ref 1.005–1.030)
Urobilinogen, UA: 0.2 mg/dL (ref 0.0–1.0)
pH: 5.5 (ref 5.0–8.0)

## 2014-04-10 MED ORDER — POLYETHYLENE GLYCOL 3350 17 G PO PACK: 17.0000 g | PACK | Freq: Every day | ORAL | Status: DC

## 2014-04-10 NOTE — ED Provider Notes (Signed)
CSN: [id number]     Arrival date & time 04/10/14  1717 History   First MD Initiated Contact with Patient 04/10/14 1816     Chief Complaint  Patient presents with  . Back Pain   (Consider location/radiation/quality/duration/timing/severity/associated sxs/prior Treatment) Patient is a 64 y.o. male presenting with back pain. The history is provided by the patient.  Back Pain Location:  Lumbar spine Quality:  Shooting Radiates to:  Does not radiate Pain severity:  Mild Onset quality:  Gradual Duration:  2 weeks Progression:  Worsening Chronicity:  New Context comment:  Onset with vague abd pain now radiating to back. Associated symptoms: abdominal pain   Associated symptoms: no abdominal swelling, no bladder incontinence, no bowel incontinence, no chest pain, no dysuria, no numbness and no paresthesias     Past Medical History  Diagnosis Date  . Diabetes mellitus without complication     pre diabetes  . Schizoaffective disorder   . Depression, major   . Hypertension     non complaint  . Dementia    History reviewed. No pertinent past surgical history. Family History  Problem Relation Age of Onset  . Diabetes Mother   . Cancer Father   . Schizophrenia Maternal Aunt    History  Substance Use Topics  . Smoking status: Never Smoker   . Smokeless tobacco: Never Used  . Alcohol Use: No    Review of Systems  Cardiovascular: Negative for chest pain.  Gastrointestinal: Positive for abdominal pain. Negative for nausea, vomiting, diarrhea, constipation, blood in stool and bowel incontinence.  Genitourinary: Negative.  Negative for bladder incontinence and dysuria.  Musculoskeletal: Positive for back pain. Negative for gait problem.  Neurological: Negative for numbness and paresthesias.    Allergies  Review of patient's allergies indicates no known allergies.  Home Medications   Prior to Admission medications   Medication Sig Start Date End Date Taking? Authorizing  Provider  clindamycin (CLEOCIN) 300 MG capsule Take 1 capsule (300 mg total) by mouth 4 (four) times daily. Patient not taking: Reported on 04/09/2014 02/11/14   Santiago GladHeather Laisure, PA-C  ibuprofen (ADVIL,MOTRIN) 200 MG tablet Take 800 mg by mouth every 6 (six) hours as needed for moderate pain.    Historical Provider, MD  lisinopril (PRINIVIL,ZESTRIL) 5 MG tablet Take 5 mg by mouth daily. 10/26/13 10/26/14  Historical Provider, MD  polyethylene glycol (MIRALAX / GLYCOLAX) packet Take 17 g by mouth daily. 04/10/14   Linna HoffJames D Kindl, MD  risperiDONE (RISPERDAL M-TABS) 4 MG disintegrating tablet Take 4 mg by mouth at bedtime. 10/26/13   Historical Provider, MD   BP 158/108 mmHg  Pulse 100  Temp(Src) 99.6 F (37.6 C) (Oral)  Resp 20  SpO2 95% Physical Exam  Constitutional: He is oriented to person, place, and time. He appears well-developed and well-nourished. No distress.  Cardiovascular: Normal rate, regular rhythm, normal heart sounds and intact distal pulses.   Pulmonary/Chest: Effort normal and breath sounds normal.  Abdominal: Soft. Bowel sounds are normal. He exhibits no distension and no mass. There is no tenderness. There is no rebound and no guarding.  Musculoskeletal: He exhibits no tenderness.  Neurological: He is alert and oriented to person, place, and time.  Skin: Skin is warm and dry.  Nursing note and vitals reviewed.   ED Course  Procedures (including critical care time) Labs Review Labs Reviewed  POCT URINALYSIS DIP (DEVICE)   U/a neg. Imaging Review Dg Abd 2 Views  04/10/2014   CLINICAL DATA:  Abdominal pain for  several weeks.  EXAM: ABDOMEN - 1 VIEW  COMPARISON:  None.  FINDINGS: A single erect view is submitted. Four attempts at obtaining a supine view were unsuccessful secondary to equipment failure. Moderate stool is present throughout the colon. No bowel wall thickening or significant distention identified. There is no evidence of free intraperitoneal air or suspicious  calcification. Pelvic calcifications are likely phleboliths. There are mild lumbar spine degenerative changes.  IMPRESSION: No evidence of bowel obstruction or other acute process on single erect view. Possible constipation.   Electronically Signed   By: Roxy Horseman M.D.   On: 04/10/2014 19:24   X-rays reviewed and report per radiologist.   MDM   1. Constipation due to slow transit        Linna Hoff, MD 04/10/14 [phone]

## 2014-04-10 NOTE — Discharge Instructions (Signed)
Use medicine daily as prescribed and see gastroenterologist for colonoscopy.

## 2014-04-10 NOTE — ED Notes (Signed)
Patient complains of having some type of pain to his back and stomach region Patient also complains of having some type of spasms to his right arm and had a sharp pain  To his right foot yesterday. Patient states his symptoms have been going on almost two weeks But has not gotten any better Went to the ED yesterday but left because he felt they were ordering un necessary tests

## 2014-09-20 ENCOUNTER — Emergency Department (HOSPITAL_COMMUNITY)
Admission: EM | Admit: 2014-09-20 | Discharge: 2014-09-21 | Disposition: A | Discharge status: Home / Self Care | Payer: 59 | Attending: Emergency Medicine | Admitting: Emergency Medicine

## 2014-09-20 ENCOUNTER — Encounter (HOSPITAL_COMMUNITY): Payer: Self-pay | Admitting: Emergency Medicine

## 2014-09-20 DIAGNOSIS — I10 Essential (primary) hypertension: Secondary | ICD-10-CM | POA: Diagnosis not present

## 2014-09-20 DIAGNOSIS — F0390 Unspecified dementia without behavioral disturbance: Secondary | ICD-10-CM | POA: Insufficient documentation

## 2014-09-20 DIAGNOSIS — F329 Major depressive disorder, single episode, unspecified: Secondary | ICD-10-CM | POA: Diagnosis not present

## 2014-09-20 DIAGNOSIS — Z046 Encounter for general psychiatric examination, requested by authority: Secondary | ICD-10-CM | POA: Diagnosis present

## 2014-09-20 DIAGNOSIS — Z79899 Other long term (current) drug therapy: Secondary | ICD-10-CM | POA: Diagnosis not present

## 2014-09-20 DIAGNOSIS — Z01818 Encounter for other preprocedural examination: Secondary | ICD-10-CM

## 2014-09-20 DIAGNOSIS — F32A Depression, unspecified: Secondary | ICD-10-CM

## 2014-09-20 DIAGNOSIS — Z9119 Patient's noncompliance with other medical treatment and regimen: Secondary | ICD-10-CM | POA: Diagnosis not present

## 2014-09-20 LAB — COMPREHENSIVE METABOLIC PANEL
ALBUMIN: 4.2 g/dL (ref 3.5–5.0)
ALK PHOS: 68 U/L (ref 38–126)
ALT: 12 U/L — AB (ref 17–63)
AST: 13 U/L — ABNORMAL LOW (ref 15–41)
Anion gap: 13 (ref 5–15)
BUN: 19 mg/dL (ref 6–20)
CO2: 24 mmol/L (ref 22–32)
Calcium: 9.3 mg/dL (ref 8.9–10.3)
Chloride: 102 mmol/L (ref 101–111)
Creatinine, Ser: 1.23 mg/dL (ref 0.61–1.24)
GLUCOSE: 92 mg/dL (ref 65–99)
POTASSIUM: 3.8 mmol/L (ref 3.5–5.1)
SODIUM: 139 mmol/L (ref 135–145)
Total Bilirubin: 1.1 mg/dL (ref 0.3–1.2)
Total Protein: 8 g/dL (ref 6.5–8.1)

## 2014-09-20 LAB — CBC
HCT: 48.9 % (ref 39.0–52.0)
Hemoglobin: 17.2 g/dL — ABNORMAL HIGH (ref 13.0–17.0)
MCH: 27.7 pg (ref 26.0–34.0)
MCHC: 35.2 g/dL (ref 30.0–36.0)
MCV: 78.9 fL (ref 78.0–100.0)
Platelets: 122 10*3/uL — ABNORMAL LOW (ref 150–400)
RBC: 6.2 MIL/uL — ABNORMAL HIGH (ref 4.22–5.81)
RDW: 15 % (ref 11.5–15.5)
WBC: 4.4 10*3/uL (ref 4.0–10.5)

## 2014-09-20 LAB — SALICYLATE LEVEL: Salicylate Lvl: <4 mg/dL (ref 2.8–30.0)

## 2014-09-20 LAB — ACETAMINOPHEN LEVEL

## 2014-09-20 LAB — ETHANOL

## 2014-09-20 NOTE — BH Assessment (Addendum)
Tele Assessment Note  Pt is nearly catatonic and is not responding to writer's questions verbally or non-verbally. Information is provide by his wife who has been his legal guardian, due to mental health issues for a year.  Stanley Watkins is an 64 y.o. male. Arriving to the ED under IVC petitioned by his wife.  Per IVC: He is a danger to himself and others. He is starving himself by not eating anything, losing a lot of weight. He is mentally incompetent. He will not take his prescribed medication. He is not suicidal. He has previously been committed several times.   Per wife pt has a history of similar episodes dating back 27 years. She reports she was told he had schizophrenic tendencies. She also reports last year when he was inpt at [facility] they expressed concerns that he may have signs of dementia. Wife reports pt has refused to go to follow up with neurologist, as he has refuse to see his dentist and eye doctor.  She reports in the past pt has had to be admitted to improve his status as he refuses to eat and will not communication.   Wife reports she filed IVC because pt has been refusing to eat and has begun dry heaving. She reports he has lost 30 -40 pounds over the course of some weeks. She reports he has been shut down and not communicating for 2-3 months. She reports he is very withdrawn. She reports they few times he has spoke with her it is barely above a whisper and he will no repeat what he has said if asked. She denies he has intentionally harmed himself or others but is worried about him as he has almost stopped functioning.   Wife reports pt was upset because she began to retirement process for him as he was no longer able to work in the [employer]. She reports he initially tried to do outings, but then stopped. She reports he also stopped having contact with his friends by phone. She reports he has had several deaths to cope with including several family members and his best  friend of more than 40 years.   She reports "even in his wellness" pt refuses to take his medications, and seems to be very guarded and anxious.   Pt continues to do his own ADLs and can drive, but has not recently. Wife reports she believes pt is sponging bathing, not taking full showers. He is going to the bathroom normally and requires not assistive devices at home.   Wife reports has no hx of SA, or abuse and neglect. In similar episodes in the past pt has required inpt care. His last inpt care he had ECT and responded well to it.     Axis I:  295.70 Schizoaffective Disorder   Rule out Dementia   Past Medical History:  Past Medical History  Diagnosis Date  . Diabetes mellitus without complication     pre diabetes  . Schizoaffective disorder   . Depression, major   . Hypertension     non complaint  . Dementia     No past surgical history on file.  Family History:  Family History  Problem Relation Age of Onset  . Diabetes Mother   . Cancer Father   . Schizophrenia Maternal Aunt     Social History:  reports that he has never smoked. He has never used smokeless tobacco. He reports that he does not drink alcohol. His drug history is not on  file.  Additional Social History:  Alcohol / Drug Use Pain Medications: See PTA Prescriptions: See PTA, wife reprots he refuses to take his medication Over the Counter: See PTA History of alcohol / drug use?: No history of alcohol / drug abuse Longest period of sobriety (when/how long): NA Negative Consequences of Use:  (NA) Withdrawal Symptoms:  (NA)  CIWA: CIWA-Ar BP: 136/81 mmHg Pulse Rate: 81 COWS:    PATIENT STRENGTHS: (choose at least two) Average or above average intelligence Supportive family/friends  Allergies: No Known Allergies  Home Medications:  (Not in a hospital admission)  OB/GYN Status:  No LMP for male patient.  General Assessment Data Location of Assessment: [facility] TTS Assessment: In system Is this a  Tele or Face-to-Face Assessment?: Face-to-Face Is this an Initial Assessment or a Re-assessment for this encounter?: Initial Assessment Marital status: Married Is patient pregnant?: No Pregnancy Status: No Living Arrangements: Spouse/significant other (and 77 y.o son ) Can pt return to current living arrangement?: Yes Admission Status: Involuntary Is patient capable of signing voluntary admission?: No Referral Source: Other (wife) Insurance type: Medical sales representative     Crisis Care Plan Living Arrangements: Spouse/significant other (and 48 y.o son ) Name of Psychiatrist: refuses to go Name of Therapist: none  Education Status Is patient currently in school?: No Current Grade: NA Highest grade of school patient has completed: college Name of school: NA Contact person: wife [name] Dejoseph   Risk to self with the past 6 months Suicidal Ideation: No Has patient been a risk to self within the past 6 months prior to admission? : No Suicidal Intent: No Has patient had any suicidal intent within the past 6 months prior to admission? : No Is patient at risk for suicide?: No Suicidal Plan?: No Has patient had any suicidal plan within the past 6 months prior to admission? : No Access to Means: No What has been your use of drugs/alcohol within the last 12 months?: none Previous Attempts/Gestures: No How many times?: 0 Other Self Harm Risks: none Triggers for Past Attempts: None known Intentional Self Injurious Behavior: None Family Suicide History: No Recent stressful life event(s): Loss (Comment) (stopped working, multiple deaths recently of loved ones) Persecutory voices/beliefs?: No Depression: Yes Depression Symptoms: Loss of interest in usual pleasures (nearly catatonic ) Substance abuse history and/or treatment for substance abuse?: No Suicide prevention information given to non-admitted patients: Not applicable  Risk to Others within the past 6 months Homicidal Ideation: No Does  patient have any lifetime risk of violence toward others beyond the six months prior to admission? : No Thoughts of Harm to Others: No Current Homicidal Intent: No Current Homicidal Plan: No Access to Homicidal Means: No Identified Victim: none History of harm to others?: No Assessment of Violence: In past 6-12 months Violent Behavior Description: wife reports pt was a little combative during last hospitalization  Does patient have access to weapons?: No Criminal Charges Pending?: No Does patient have a court date: No Is patient on probation?: No  Psychosis Hallucinations: None noted Delusions: None noted  Mental Status Report Appearance/Hygiene: In scrubs Eye Contact: Poor Motor Activity: Rigidity Speech: Unable to assess Level of Consciousness: Drowsy Mood:  (UTA) Affect: Flat Anxiety Level:  (UTA) Thought Processes: Unable to Assess Judgement: Unable to Assess Orientation: Unable to assess Obsessive Compulsive Thoughts/Behaviors: None  Cognitive Functioning Concentration: Unable to Assess Memory: Unable to Assess IQ: Average Insight: Unable to Assess Impulse Control: Unable to Assess Appetite: Poor Weight Loss: 30 (30 -40 pounds)  Weight Gain: 0 Sleep: Increased Total Hours of Sleep:  (sleeping most of the day ) Vegetative Symptoms: Staying in bed, Decreased grooming  ADLScreening [facility]) Patient's cognitive ability adequate to safely complete daily activities?:  (UTA, wife reports some memory problems when not in current state, but he does own ADLs, drive etc ) Patient able to express need for assistance with ADLs?:  (not in current state ) Independently performs ADLs?: Yes (appropriate for developmental age)  Prior Inpatient Therapy Prior Inpatient Therapy: Yes Prior Therapy Dates: mulitple dating back 27 years to last year [facility]  Reason for Treatment: catonic behaviors  Prior  Outpatient Therapy Prior Outpatient Therapy: No Prior Therapy Dates: NA Prior Therapy Facilty/Provider(s): NA Reason for Treatment: NA Does patient have an ACCT team?: No Does patient have Intensive In-House Services?  : No Does patient have [facility] services? : No Does patient have P4CC services?: No  ADL Screening (condition at time of admission) Patient's cognitive ability adequate to safely complete daily activities?:  (UTA, wife reports some memory problems when not in current state, but he does own ADLs, drive etc ) Is the patient deaf or have difficulty hearing?: No Does the patient have difficulty seeing, even when wearing glasses/contacts?: Yes (refuses to go to eye doctor) Does the patient have difficulty concentrating, remembering, or making decisions?:  (UTA) Patient able to express need for assistance with ADLs?:  (not in current state ) Does the patient have difficulty dressing or bathing?: No Independently performs ADLs?: Yes (appropriate for developmental age) Does the patient have difficulty walking or climbing stairs?: No Weakness of Legs: None Weakness of Arms/Hands: None  Home Assistive Devices/Equipment Home Assistive Devices/Equipment: None    Abuse/Neglect Assessment (Assessment to be complete while patient is alone) Physical Abuse: Denies (per wife) Verbal Abuse: Denies (per wife) Sexual Abuse: Denies (per wife ) Exploitation of patient/patient's resources: Denies (per wife ) Self-Neglect: Yes, present (Comment) Values / Beliefs Cultural Requests During Hospitalization: None Spiritual Requests During Hospitalization: None   Advance Directives (For Healthcare) Does patient have an advance directive?: No Would patient like information on creating an advanced directive?: No - patient declined information Nutrition Screen- [facility] Patient's home diet: Regular Has the patient recently lost weight without trying?: Yes, 34 lbs. or greater Has the  patient been eating poorly because of a decreased appetite?: Yes Malnutrition Screening Tool Score: 5  Additional Information 1:1 In Past 12 Months?: No CIRT Risk: Yes Elopement Risk: No Does patient have medical clearance?: No     Disposition:  Per Donell SievertSpencer Simon, PA pt meets inpt criteria and should be referred for gero psych placement. [facility] to be considered if declined elsewhere.   Wife is in agreement with plan. She requests he not be referred too far away as this would be a hardship for her.   Informed RN of plan.   Informed Dr. Hyacinth MeekerMiller of recommendations.   Clista BernhardtNancy Reisha Wos, [facility] Triage Specialist 09/20/2014 10:30 PM  Disposition Initial Assessment Completed for this Encounter: Yes  Manuelito Poage M 09/20/2014 10:29 PM

## 2014-09-20 NOTE — ED Notes (Signed)
Pt's medications and belonging's given to pt's wife Burlene ArntWanda Blais. Pt's wife can be reached at [phone]

## 2014-09-20 NOTE — ED Notes (Signed)
Attempted to obtain urine sample from the pt, but unable to at this time. Pt ambulatory to the bathroom but unable to obtain urine sample, pt not compliant.

## 2014-09-20 NOTE — ED Notes (Signed)
Bed: WA27 Expected date:  Expected time:  Means of arrival:  Comments: 

## 2014-09-20 NOTE — BH Assessment (Signed)
Reviewed ED notes prior to initiating assessment. Pt with hx of schizoaffective disorder has been brought in under IVC due to decline. He has not been taking his medications in 10 months per his wife, is not eating, and not communicating. She reports he has had similar episodes in the past that required inpt hospitalization to improve his status.   Assessment to commence shortly.    Clista BernhardtNancy Andreia Gandolfi, [facility] Triage Specialist 09/20/2014 9:48 PM
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
## 2014-09-20 NOTE — ED Provider Notes (Signed)
CSN: [id number]     Arrival date & time 09/20/14  1744 History   First MD Initiated Contact with Patient 09/20/14 2116     Chief Complaint  Patient presents with  . IVC   . Failure To Thrive     (Consider location/radiation/quality/duration/timing/severity/associated sxs/prior Treatment) HPI Comments: 64 year old male with schizoaffective disorder and depression, presents to the hospital with involuntary commitment papers that were taken out by his wife after stating that he has been off his medication for over 10 months and over the last couple of weeks has had a gradual decline into becoming resistant to any interpersonal contact, he is not behaving as usual as he does, he is not speaking to anybody, he prefers isolation and refuses to take any medications. He is not eating or drinking, his wife believes that he is wasting away and losing weight, she is very concerned about this deep depression. He has done this several times in the past, he has required inpatient commitment to treat and improve his symptoms. She does not see any signs of hallucinations, he has not talked about hurting himself and has no history of suicide. They deny substance abuse. The history was obtained exclusively from the wife as the patient does not speak  The history is provided by the patient.    Past Medical History  Diagnosis Date  . Diabetes mellitus without complication     pre diabetes  . Schizoaffective disorder   . Depression, major   . Hypertension     non complaint  . Dementia    No past surgical history on file. Family History  Problem Relation Age of Onset  . Diabetes Mother   . Cancer Father   . Schizophrenia Maternal Aunt    History  Substance Use Topics  . Smoking status: Never Smoker   . Smokeless tobacco: Never Used  . Alcohol Use: No    Review of Systems  Unable to perform ROS: Psychiatric disorder      Allergies  Review of patient's allergies indicates no known  allergies.  Home Medications   Prior to Admission medications   Medication Sig Start Date End Date Taking? Authorizing Provider  ibuprofen (ADVIL,MOTRIN) 200 MG tablet Take 800 mg by mouth every 6 (six) hours as needed for moderate pain.   Yes Historical Provider, MD  polyethylene glycol (MIRALAX / GLYCOLAX) packet Take 17 g by mouth daily. Patient not taking: Reported on 09/20/2014 04/10/14   Linna Hoff, MD   BP 151/95 mmHg  Pulse 94  Temp(Src) 98.3 F (36.8 C) (Oral)  Resp 20  SpO2 98% Physical Exam  Constitutional: He appears well-developed and well-nourished. No distress.  HENT:  Head: Normocephalic and atraumatic.  Mouth/Throat: Oropharynx is clear and moist. No oropharyngeal exudate.  Eyes: Conjunctivae and EOM are normal. Pupils are equal, round, and reactive to light. Right eye exhibits no discharge. Left eye exhibits no discharge. No scleral icterus.  Neck: Normal range of motion. Neck supple. No JVD present. No thyromegaly present.  Cardiovascular: Normal rate, regular rhythm, normal heart sounds and intact distal pulses.  Exam reveals no gallop and no friction rub.   No murmur heard. Pulmonary/Chest: Effort normal and breath sounds normal. No respiratory distress. He has no wheezes. He has no rales.  Abdominal: Soft. Bowel sounds are normal. He exhibits no distension and no mass. There is no tenderness.  Musculoskeletal: Normal range of motion. He exhibits no edema or tenderness.  Lymphadenopathy:    He has no cervical  adenopathy.  Neurological: He is alert. Coordination normal.  Moves all 4 extremities spontaneously, he is changing the bed sheets, rolling over in the bed without difficulty, purposeful movements appear to have normal coordination  Skin: Skin is warm and dry. No rash noted. No erythema.  Psychiatric:  Very flat affect, does not follow commands, refuses to interact with the examiner  Nursing note and vitals reviewed.   ED Course  Procedures (including  critical care time) Labs Review Labs Reviewed  COMPREHENSIVE METABOLIC PANEL - Abnormal; Notable for the following:    AST 13 (*)    ALT 12 (*)    All other components within normal limits  ACETAMINOPHEN LEVEL - Abnormal; Notable for the following:    Acetaminophen (Tylenol), Serum <10 (*)    All other components within normal limits  CBC - Abnormal; Notable for the following:    RBC 6.20 (*)    Hemoglobin 17.2 (*)    Platelets 122 (*)    All other components within normal limits  ETHANOL  SALICYLATE LEVEL  URINE RAPID DRUG SCREEN, HOSP PERFORMED    Imaging Review No results found.   EKG Interpretation None      MDM   Final diagnoses:  Depression    The patient appears to have had a decline into a severe depressive state, vital signs are unremarkable, lab work thus far is unremarkable, will need evaluation by psychiatry as he will likely need inpatient treatment.  The pt was seen by TTS who agreed that the pt needs placement in a geri-psych bed.  Eber HongBrian Lavonya Hoerner, MD 09/21/14 [phone]

## 2014-09-20 NOTE — ED Notes (Signed)
Password: Freida [email or web address]

## 2014-09-21 ENCOUNTER — Emergency Department (HOSPITAL_COMMUNITY): Payer: 59

## 2014-09-21 DIAGNOSIS — F32A Depression, unspecified: Secondary | ICD-10-CM | POA: Insufficient documentation

## 2014-09-21 DIAGNOSIS — F329 Major depressive disorder, single episode, unspecified: Secondary | ICD-10-CM | POA: Insufficient documentation

## 2014-09-21 DIAGNOSIS — Z01818 Encounter for other preprocedural examination: Secondary | ICD-10-CM | POA: Insufficient documentation

## 2014-09-21 MED ORDER — ENSURE ENLIVE PO LIQD
237.0000 mL | Freq: Two times a day (BID) | ORAL | Status: DC
  Administered 2014-09-21: 237 mL via ORAL
  Filled 2014-09-21: qty 237

## 2014-09-21 MED ORDER — TRAZODONE HCL 50 MG PO TABS: 50.0000 mg | ORAL_TABLET | Freq: Every evening | ORAL | Status: DC | PRN

## 2014-09-21 MED ORDER — SERTRALINE HCL 50 MG PO TABS
50.0000 mg | ORAL_TABLET | Freq: Every day | ORAL | Status: DC
  Administered 2014-09-21: 50 mg via ORAL
  Filled 2014-09-21: qty 1

## 2014-09-21 MED ORDER — LORAZEPAM 0.5 MG PO TABS
0.5000 mg | ORAL_TABLET | Freq: Two times a day (BID) | ORAL | Status: DC
  Administered 2014-09-21: 0.5 mg via ORAL
  Filled 2014-09-21: qty 1

## 2014-09-21 MED ORDER — RISPERIDONE 0.5 MG PO TABS: 0.5000 mg | ORAL_TABLET | Freq: Every day | ORAL | Status: DC

## 2014-09-21 NOTE — Progress Notes (Signed)
Nutrition Note  RD consulted for nutritional assessment.  Per RN, pt's wife reports patient refusing to eat and take medications for the last month. PO intake: 50% this morning. Pt with no recent weight or height. Need for weight change assessment. Recommended weight and height be obtained as last weight is from 2014.  RD to order Ensure Enlive as pt has poor PO intake at this time.  Wt Readings from Last 15 Encounters:  11/11/12 232 lb (105.235 kg)   Labs and medications reviewed.   If nutrition issues arise, please consult RD.   Tilda FrancoLindsey Blondina Coderre, MS, RD, LDN Pager: [phone] After Hours Pager: [phone]

## 2014-09-21 NOTE — ED Notes (Signed)
Pt took the pills and placed them in his mouth but would not allow me to see if he was actually going to swallow them. Neither would pt take a drink of water to ensure that the medication had went down. Pt still has not spoken a word to me but I was able with security to convince the pt that he needed to walk with me to the scales so that he could be weighed and he complied.

## 2014-09-21 NOTE — BH Assessment (Signed)
Seeking gero psych placement. [facility]: [facility], [facility]    Erinn Huskins, [place] Triage Specialist 09/21/2014 1:48 AM

## 2014-09-21 NOTE — ED Notes (Signed)
Transporting sheriff reported to staff that pt will not be transported to [facility] until tomorrow.

## 2014-09-21 NOTE — BH Assessment (Signed)
[facility] Assessment Progress Note  Per Thedore MinsMojeed Akintayo, MD, this pt requires psychiatric hospitalization.  At 16:31 I spoke to [place] [facility].  Pt has been accepted to their facility by Lowanda FosterBeverly Jones, MD.  Please call report to [phone].  Pt's nurse has been notified.  [facility] is under IVC [facility]  Doylene Canninghomas Pax Reasoner, MA Triage Specialist [phone]

## 2014-09-21 NOTE — BH Assessment (Addendum)
[facility] Assessment Progress Note  Gracie at [facility] reports that pt is likely to be accepted to their facility either tonight or tomorrow.  They request results of EKG and chest x-ray.  This Clinical research associatewriter is attempting to fax results to them.  Decision is pending.  Doylene Canninghomas Jameire Kouba, MA Triage Specialist [phone]

## 2015-11-18 ENCOUNTER — Encounter (HOSPITAL_COMMUNITY): Payer: Self-pay | Admitting: Emergency Medicine

## 2015-11-18 ENCOUNTER — Inpatient Hospital Stay (HOSPITAL_COMMUNITY)
Admission: EM | Admit: 2015-11-18 | Discharge: 2015-11-24 | DRG: 683 | Payer: Medicare Other | Attending: Internal Medicine | Admitting: Internal Medicine

## 2015-11-18 DIAGNOSIS — R634 Abnormal weight loss: Secondary | ICD-10-CM | POA: Diagnosis present

## 2015-11-18 DIAGNOSIS — N179 Acute kidney failure, unspecified: Secondary | ICD-10-CM | POA: Diagnosis not present

## 2015-11-18 DIAGNOSIS — E870 Hyperosmolality and hypernatremia: Secondary | ICD-10-CM | POA: Diagnosis present

## 2015-11-18 DIAGNOSIS — Z9114 Patient's other noncompliance with medication regimen: Secondary | ICD-10-CM

## 2015-11-18 DIAGNOSIS — I10 Essential (primary) hypertension: Secondary | ICD-10-CM | POA: Diagnosis present

## 2015-11-18 DIAGNOSIS — Z809 Family history of malignant neoplasm, unspecified: Secondary | ICD-10-CM

## 2015-11-18 DIAGNOSIS — F0390 Unspecified dementia without behavioral disturbance: Secondary | ICD-10-CM | POA: Diagnosis present

## 2015-11-18 DIAGNOSIS — E860 Dehydration: Secondary | ICD-10-CM | POA: Diagnosis present

## 2015-11-18 DIAGNOSIS — F940 Selective mutism: Secondary | ICD-10-CM | POA: Diagnosis present

## 2015-11-18 DIAGNOSIS — F329 Major depressive disorder, single episode, unspecified: Secondary | ICD-10-CM | POA: Diagnosis present

## 2015-11-18 DIAGNOSIS — R000 Tachycardia, unspecified: Secondary | ICD-10-CM

## 2015-11-18 DIAGNOSIS — Z833 Family history of diabetes mellitus: Secondary | ICD-10-CM

## 2015-11-18 DIAGNOSIS — D696 Thrombocytopenia, unspecified: Secondary | ICD-10-CM

## 2015-11-18 DIAGNOSIS — E119 Type 2 diabetes mellitus without complications: Secondary | ICD-10-CM

## 2015-11-18 DIAGNOSIS — R339 Retention of urine, unspecified: Secondary | ICD-10-CM | POA: Diagnosis present

## 2015-11-18 DIAGNOSIS — E43 Unspecified severe protein-calorie malnutrition: Secondary | ICD-10-CM | POA: Insufficient documentation

## 2015-11-18 DIAGNOSIS — F259 Schizoaffective disorder, unspecified: Secondary | ICD-10-CM | POA: Diagnosis present

## 2015-11-18 DIAGNOSIS — Z046 Encounter for general psychiatric examination, requested by authority: Secondary | ICD-10-CM

## 2015-11-18 DIAGNOSIS — D751 Secondary polycythemia: Secondary | ICD-10-CM | POA: Diagnosis present

## 2015-11-18 DIAGNOSIS — N320 Bladder-neck obstruction: Secondary | ICD-10-CM | POA: Diagnosis present

## 2015-11-18 DIAGNOSIS — E876 Hypokalemia: Secondary | ICD-10-CM | POA: Diagnosis present

## 2015-11-18 DIAGNOSIS — F32A Depression, unspecified: Secondary | ICD-10-CM | POA: Diagnosis present

## 2015-11-18 DIAGNOSIS — Z818 Family history of other mental and behavioral disorders: Secondary | ICD-10-CM

## 2015-11-18 DIAGNOSIS — R740 Nonspecific elevation of levels of transaminase and lactic acid dehydrogenase [LDH]: Secondary | ICD-10-CM | POA: Diagnosis present

## 2015-11-18 LAB — COMPREHENSIVE METABOLIC PANEL
ALT: 81 U/L — ABNORMAL HIGH (ref 17–63)
ANION GAP: 15 (ref 5–15)
AST: 67 U/L — ABNORMAL HIGH (ref 15–41)
Albumin: 4.7 g/dL (ref 3.5–5.0)
Alkaline Phosphatase: 75 U/L (ref 38–126)
BUN: 81 mg/dL — ABNORMAL HIGH (ref 6–20)
CHLORIDE: 114 mmol/L — AB (ref 101–111)
CO2: 25 mmol/L (ref 22–32)
Calcium: 9.9 mg/dL (ref 8.9–10.3)
Creatinine, Ser: 2.51 mg/dL — ABNORMAL HIGH (ref 0.61–1.24)
GFR calc Af Amer: 30 mL/min — ABNORMAL LOW (ref >60)
GFR calc non Af Amer: 26 mL/min — ABNORMAL LOW (ref >60)
GLUCOSE: 158 mg/dL — AB (ref 65–99)
POTASSIUM: 4.4 mmol/L (ref 3.5–5.1)
Sodium: 154 mmol/L — ABNORMAL HIGH (ref 135–145)
TOTAL PROTEIN: 9.6 g/dL — AB (ref 6.5–8.1)
Total Bilirubin: 3.1 mg/dL — ABNORMAL HIGH (ref 0.3–1.2)

## 2015-11-18 LAB — CBC
HEMATOCRIT: 62.4 % — AB (ref 39.0–52.0)
Hemoglobin: 20.6 g/dL — ABNORMAL HIGH (ref 13.0–17.0)
MCH: 27.1 pg (ref 26.0–34.0)
MCHC: 33 g/dL (ref 30.0–36.0)
MCV: 82.1 fL (ref 78.0–100.0)
PLATELETS: 139 10*3/uL — AB (ref 150–400)
RBC: 7.6 MIL/uL — AB (ref 4.22–5.81)
RDW: 16.8 % — ABNORMAL HIGH (ref 11.5–15.5)
WBC: 9.8 10*3/uL (ref 4.0–10.5)

## 2015-11-18 LAB — SALICYLATE LEVEL: Salicylate Lvl: <4 mg/dL (ref 2.8–30.0)

## 2015-11-18 LAB — ACETAMINOPHEN LEVEL

## 2015-11-18 LAB — ETHANOL

## 2015-11-18 MED ORDER — SODIUM CHLORIDE 0.9 % IV BOLUS (SEPSIS)
2000.0000 mL | Freq: Once | INTRAVENOUS | Status: AC
  Administered 2015-11-18: 2000 mL via INTRAVENOUS

## 2015-11-18 NOTE — ED Notes (Signed)
Pt found in room with IV pulled from arm and fluids running over floor.  Bleeding controlled.  EDP notified

## 2015-11-18 NOTE — ED Provider Notes (Signed)
[facility]-EMERGENCY DEPT Provider Note   CSN: [id number] Arrival date & time: 11/18/15  2152     History   Chief Complaint Chief Complaint  Patient presents with  . Fatigue  . Medical Clearance    HPI Stanley Watkins is a 65 y.o. male.  He presents for evaluation of weight loss, recurrent depression, isolation, and sadness, progressive over the last several months. Similar problem in 2016 and required admission at a psychiatric facility where he received electroconvulsive therapy. On that hospitalization, he was compliant with medications and therapy, but in the last several months, stopped both of those treatments. His wife committed him today, for these reasons. He is unable to give history.  Level V caveat- altered mental status  HPI  Past Medical History:  Diagnosis Date  . Dementia   . Depression, major (HCC)   . Diabetes mellitus without complication (HCC)    pre diabetes  . Hypertension    non complaint  . Schizoaffective disorder [facility](H[facility])     Patient Active Problem List   Diagnosis Date Noted  . Depression   . Encounter for preadmission testing   . Schizoaffective disorder (HCC)     History reviewed. No pertinent surgical history.     Home Medications    Prior to Admission medications   Medication Sig Start Date End Date Taking? Authorizing Provider  polyethylene glycol (MIRALAX / GLYCOLAX) packet Take 17 g by mouth daily. Patient not taking: Reported on 09/20/2014 04/10/14   Linna HoffJames D Kindl, MD    Family History Family History  Problem Relation Age of Onset  . Diabetes Mother   . Cancer Father   . Schizophrenia Maternal Aunt     Social History Social History  Substance Use Topics  . Smoking status: Never Smoker  . Smokeless tobacco: Never Used  . Alcohol use No     Allergies   Review of patient's allergies indicates no known allergies.   Review of Systems Review of Systems  Unable to perform ROS: Mental status change     Physical  Exam Updated Vital Signs BP 111/94 (BP Location: Right Arm)   Pulse (!) 146   Temp 98.4 F (36.9 C) (Oral)   Resp 16   Ht 6' (1.829 m)   Wt 190 lb (86.2 kg)   SpO2 100%   BMI 25.77 kg/m   Physical Exam  Constitutional: He appears well-developed and well-nourished.  HENT:  Head: Normocephalic and atraumatic.  Right Ear: External ear normal.  Left Ear: External ear normal.  He squeezes his eyes and mouth shut preventing my full examination.  Eyes: Conjunctivae are normal.  Neck: Normal range of motion and phonation normal. Neck supple.  Cardiovascular: Regular rhythm and normal heart sounds.   Tachycardic  Pulmonary/Chest: Effort normal and breath sounds normal. He exhibits no bony tenderness.  Abdominal: Soft. There is no tenderness.  Musculoskeletal: Normal range of motion.  Neurological: He is alert. No cranial nerve deficit or sensory deficit. He exhibits normal muscle tone. Coordination normal.  Skin: Skin is warm, dry and intact.  Psychiatric:  Depressed, noncommunicative. When distracted. He is interactive, by watching examiner.  Nursing note and vitals reviewed.    ED Treatments / Results  Labs (all labs ordered are listed, but only abnormal results are displayed) Labs Reviewed  COMPREHENSIVE METABOLIC PANEL  ETHANOL  SALICYLATE LEVEL  ACETAMINOPHEN LEVEL  CBC  URINE RAPID DRUG SCREEN, HOSP PERFORMED    EKG  EKG Interpretation None  Radiology No results found.  Procedures Procedures (including critical care time)  Medications Ordered in ED Medications - No data to display   Initial Impression / Assessment and Plan / ED Course  I have reviewed the triage vital signs and the nursing notes.  Pertinent labs & imaging results that were available during my care of the patient were reviewed by me and considered in my medical decision making (see chart for details).  Clinical Course  Value Comment By Time  Sodium: (!) 154 high Mancel Bale,  MD 09/10 2355  Chloride: (!) 114 high Mancel Bale, MD 09/10 2356  Glucose: (!) 158 high Mancel Bale, MD 09/10 2356  Total Bilirubin: (!) 3.1 high Mancel Bale, MD 09/10 2356  Hemoglobin: (!) 20.6 high Mancel Bale, MD 09/10 2356  Creatinine: (!) 2.51 high Mancel Bale, MD 09/10 2356  BUN: (!) 81 high Mancel Bale, MD 09/10 2357    Medications - No data to display  Patient Vitals for the past 24 hrs:  BP Temp Temp src Pulse Resp SpO2 Height Weight  11/18/15 2217 - - - - - - 6' (1.829 m) 190 lb (86.2 kg)  11/18/15 2216 111/94 98.4 F (36.9 C) Oral (!) 146 16 100 % - -    11:59 PM Reevaluation with update and discussion. After initial assessment and treatment, an updated evaluation reveals He has required restraints because he pulled his IV out. Findings discussed with wife. Patient remains uncooperative.Mancel Bale L    12:00 AM-Consult complete with hospitalist. Patient case explained and discussed. He agrees to admit patient for further evaluation and treatment. Call ended at 00:05  Final Clinical Impressions(s) / ED Diagnoses   Final diagnoses:  Dehydration  AKI (acute kidney injury) (HCC)  Depression  Involuntary commitment    Severe depression with very poor nutrition and secondary acute kidney injury, with hyper need treatment. Anemia, hypercholesteremia, elevated bilirubin and elevated hemoglobin. He will require medical admission for stabilization, prior to completion of psychiatric treatment.   Nursing Notes Reviewed/ Care Coordinated, and agree without changes. Applicable Imaging Reviewed.  Interpretation of Laboratory Data incorporated into ED treatment   Plan: Admit  New Prescriptions New Prescriptions   No medications on file     Mancel Bale, MD 11/19/15 0009

## 2015-11-18 NOTE — ED Notes (Signed)
Pts wife has gone home and can be reached at [phone]

## 2015-11-18 NOTE — ED Triage Notes (Addendum)
Wife IVC patient because he stopped talking and secluded off from everybody. Patient would not eat, drink, or bathe. Patient has hx major depression. Patient is not taking medication. Wife states patient has lost 20lbs.

## 2015-11-18 NOTE — ED Notes (Signed)
Bed: [facility] Expected date:  Expected time:  Means of arrival:  Comments: 

## 2015-11-19 ENCOUNTER — Encounter (HOSPITAL_COMMUNITY): Payer: Self-pay | Admitting: Internal Medicine

## 2015-11-19 DIAGNOSIS — D696 Thrombocytopenia, unspecified: Secondary | ICD-10-CM | POA: Diagnosis present

## 2015-11-19 DIAGNOSIS — E870 Hyperosmolality and hypernatremia: Secondary | ICD-10-CM | POA: Diagnosis present

## 2015-11-19 DIAGNOSIS — Z9114 Patient's other noncompliance with medication regimen: Secondary | ICD-10-CM | POA: Diagnosis not present

## 2015-11-19 DIAGNOSIS — E876 Hypokalemia: Secondary | ICD-10-CM | POA: Diagnosis present

## 2015-11-19 DIAGNOSIS — D751 Secondary polycythemia: Secondary | ICD-10-CM | POA: Diagnosis present

## 2015-11-19 DIAGNOSIS — F940 Selective mutism: Secondary | ICD-10-CM | POA: Diagnosis present

## 2015-11-19 DIAGNOSIS — R339 Retention of urine, unspecified: Secondary | ICD-10-CM | POA: Diagnosis present

## 2015-11-19 DIAGNOSIS — R740 Nonspecific elevation of levels of transaminase and lactic acid dehydrogenase [LDH]: Secondary | ICD-10-CM | POA: Diagnosis present

## 2015-11-19 DIAGNOSIS — E119 Type 2 diabetes mellitus without complications: Secondary | ICD-10-CM

## 2015-11-19 DIAGNOSIS — N179 Acute kidney failure, unspecified: Secondary | ICD-10-CM | POA: Diagnosis present

## 2015-11-19 DIAGNOSIS — E860 Dehydration: Secondary | ICD-10-CM | POA: Diagnosis present

## 2015-11-19 DIAGNOSIS — R000 Tachycardia, unspecified: Secondary | ICD-10-CM

## 2015-11-19 DIAGNOSIS — F329 Major depressive disorder, single episode, unspecified: Secondary | ICD-10-CM

## 2015-11-19 DIAGNOSIS — Z809 Family history of malignant neoplasm, unspecified: Secondary | ICD-10-CM | POA: Diagnosis not present

## 2015-11-19 DIAGNOSIS — I10 Essential (primary) hypertension: Secondary | ICD-10-CM | POA: Diagnosis present

## 2015-11-19 DIAGNOSIS — F0390 Unspecified dementia without behavioral disturbance: Secondary | ICD-10-CM | POA: Diagnosis present

## 2015-11-19 DIAGNOSIS — N320 Bladder-neck obstruction: Secondary | ICD-10-CM | POA: Diagnosis present

## 2015-11-19 DIAGNOSIS — E43 Unspecified severe protein-calorie malnutrition: Secondary | ICD-10-CM | POA: Insufficient documentation

## 2015-11-19 DIAGNOSIS — Z818 Family history of other mental and behavioral disorders: Secondary | ICD-10-CM | POA: Diagnosis not present

## 2015-11-19 DIAGNOSIS — R634 Abnormal weight loss: Secondary | ICD-10-CM | POA: Diagnosis present

## 2015-11-19 DIAGNOSIS — Z833 Family history of diabetes mellitus: Secondary | ICD-10-CM | POA: Diagnosis not present

## 2015-11-19 DIAGNOSIS — F259 Schizoaffective disorder, unspecified: Secondary | ICD-10-CM | POA: Diagnosis present

## 2015-11-19 LAB — COMPREHENSIVE METABOLIC PANEL
ALBUMIN: 3.9 g/dL (ref 3.5–5.0)
ALT: 67 U/L — ABNORMAL HIGH (ref 17–63)
AST: 55 U/L — AB (ref 15–41)
Alkaline Phosphatase: 63 U/L (ref 38–126)
Anion gap: 13 (ref 5–15)
BUN: 68 mg/dL — AB (ref 6–20)
CHLORIDE: 117 mmol/L — AB (ref 101–111)
CO2: 27 mmol/L (ref 22–32)
Calcium: 8.8 mg/dL — ABNORMAL LOW (ref 8.9–10.3)
Creatinine, Ser: 1.89 mg/dL — ABNORMAL HIGH (ref 0.61–1.24)
GFR calc Af Amer: 42 mL/min — ABNORMAL LOW (ref >60)
GFR, EST NON AFRICAN AMERICAN: 36 mL/min — AB (ref >60)
Glucose, Bld: 125 mg/dL — ABNORMAL HIGH (ref 65–99)
POTASSIUM: 4.4 mmol/L (ref 3.5–5.1)
Sodium: 157 mmol/L — ABNORMAL HIGH (ref 135–145)
Total Bilirubin: 3 mg/dL — ABNORMAL HIGH (ref 0.3–1.2)
Total Protein: 8 g/dL (ref 6.5–8.1)

## 2015-11-19 LAB — GLUCOSE, CAPILLARY
GLUCOSE-CAPILLARY: 130 mg/dL — AB (ref 65–99)
Glucose-Capillary: 158 mg/dL — ABNORMAL HIGH (ref 65–99)
Glucose-Capillary: 131 mg/dL — ABNORMAL HIGH (ref 65–99)

## 2015-11-19 MED ORDER — SODIUM CHLORIDE 0.9% FLUSH
3.0000 mL | Freq: Two times a day (BID) | INTRAVENOUS | Status: DC
  Administered 2015-11-19 – 2015-11-23 (×2): 3 mL via INTRAVENOUS

## 2015-11-19 MED ORDER — ENOXAPARIN SODIUM 40 MG/0.4ML ~~LOC~~ SOLN
40.0000 mg | SUBCUTANEOUS | Status: DC
  Administered 2015-11-19: 40 mg via SUBCUTANEOUS
  Filled 2015-11-19: qty 0.4

## 2015-11-19 MED ORDER — DEXTROSE 5 % IV SOLN
INTRAVENOUS | Status: DC
  Administered 2015-11-19 – 2015-11-22 (×7): via INTRAVENOUS
  Administered 2015-11-22: 1000 mL via INTRAVENOUS
  Administered 2015-11-23 (×2): via INTRAVENOUS

## 2015-11-19 MED ORDER — ENSURE ENLIVE PO LIQD
237.0000 mL | Freq: Two times a day (BID) | ORAL | Status: DC
  Administered 2015-11-22 – 2015-11-23 (×2): 237 mL via ORAL

## 2015-11-19 MED ORDER — LORAZEPAM 2 MG/ML IJ SOLN: 1.0000 mg | INTRAMUSCULAR | Status: DC | PRN

## 2015-11-19 MED ORDER — SODIUM CHLORIDE 0.45 % IV SOLN
INTRAVENOUS | Status: DC
  Administered 2015-11-19: 02:00:00 via INTRAVENOUS

## 2015-11-19 MED ORDER — INSULIN ASPART 100 UNIT/ML ~~LOC~~ SOLN
0.0000 [IU] | Freq: Three times a day (TID) | SUBCUTANEOUS | Status: DC
  Administered 2015-11-19: 1 [IU] via SUBCUTANEOUS
  Administered 2015-11-19: 2 [IU] via SUBCUTANEOUS
  Administered 2015-11-20 – 2015-11-23 (×8): 1 [IU] via SUBCUTANEOUS

## 2015-11-19 MED ORDER — INSULIN ASPART 100 UNIT/ML ~~LOC~~ SOLN: 0.0000 [IU] | Freq: Every day | SUBCUTANEOUS | Status: DC

## 2015-11-19 NOTE — Progress Notes (Signed)
While placing foley this am, pt attempted to dislodge during insertion. Education provided prior, pt remains nonverbal at this time.

## 2015-11-19 NOTE — H&P (Signed)
History and Physical    Tarez Bowns Heroux ZOX:[id number] DOB: [birth year] DOA: 11/18/2015  PCP: Dorrene German, MD   Patient coming from: Home.  Chief Complaint: Severe depression.  HPI: Stanley Watkins is a 65 y.o. male with medical history significant of dementia,  type 2 diabetes, hypertension, major depression, schizoaffective disorder who was brought by EMS his wife to the emergency department due to progressively worse depression for the past few months.  Per patient's wife, for the past few months the patient has been isolating himself, secluded, sad, with very poor oral intake and a weight loss of 20 pounds. He has not taking a shower in some time. He has not been compliant with his medications. He had a similar picture in 2016 which require admission to the psychiatry service where he received ECT and medications.   ED Course: The patient had to be restrained after he pulled his IV catheter. He is currently nonverbal and noncooperative. He received a 2 L normal saline IV bolus. Workup shows hemoconcentration with a hemoglobin of 20.7 g/dL, a sodium of 811 mmol/liter, BUN of 81, creatinine of 2.51 and glucose of 158 mg/dL. He also showed mild transaminitis.  Review of Systems: As per HPI otherwise 10 point review of systems negative.    Past Medical History:  Diagnosis Date  . Dementia   . Depression, major (HCC)   . Diabetes mellitus without complication (HCC)    pre diabetes  . Hypertension    non complaint  . Schizoaffective disorder (HCC)     History reviewed. No pertinent surgical history.   reports that he has never smoked. He has never used smokeless tobacco. He reports that he does not drink alcohol. His drug history is not on file.  No Known Allergies  Family History  Problem Relation Age of Onset  . Diabetes Mother   . Cancer Father   . Schizophrenia Maternal Aunt     Prior to Admission medications   Not on File    Physical Exam: Vitals:     11/18/15 2216 11/18/15 2217 11/18/15 2338 11/18/15 2340  BP: 111/94  (!) 120/104   Pulse: (!) 146  (!) 133   Resp: 16   19  Temp: 98.4 F (36.9 C)     TempSrc: Oral     SpO2: 100%  94%   Weight:  86.2 kg (190 lb)    Height:  6' (1.829 m)        Constitutional: Currently on 4 point restraints, disheveled. Does not answer questions or cooperate with exam. Vitals:   11/18/15 2216 11/18/15 2217 11/18/15 2338 11/18/15 2340  BP: 111/94  (!) 120/104   Pulse: (!) 146  (!) 133   Resp: 16   19  Temp: 98.4 F (36.9 C)     TempSrc: Oral     SpO2: 100%  94%   Weight:  86.2 kg (190 lb)    Height:  6' (1.829 m)     Eyes: PERRL, lids and conjunctivae normal ENMT: Mucous membranes and lips are dry.  Neck: normal, supple, no masses, no thyromegaly Respiratory: clear to auscultation bilaterally, no wheezing, no crackles. Normal respiratory effort. No accessory muscle use.  Cardiovascular: Tachycardic at 120 BPM, no murmur / rubs / gallops. No extremity edema. 2+ pedal pulses. No carotid bruits.  Abdomen: Bowel sounds positive. Soft, no tenderness, no masses palpated. No hepatosplenomegaly.  Musculoskeletal: no clubbing / cyanosis.  No contractures. Normal muscle tone.  Skin: no rashes, lesions,  ulcers on limited anterior skin exam. Neurologic: Unable to evaluate due to patient's lack of cooperation and 4 point restraining at this time.  Psychiatric: Nonverbal. Depressed.   Labs on Admission: I have personally reviewed following labs and imaging studies  CBC:  Recent Labs Lab 11/18/15 2246  WBC 9.8  HGB 20.6*  HCT 62.4*  MCV 82.1  PLT 139*   Basic Metabolic Panel:  Recent Labs Lab 11/18/15 2246  NA 154*  K 4.4  CL 114*  CO2 25  GLUCOSE 158*  BUN 81*  CREATININE 2.51*  CALCIUM 9.9   GFR: Estimated Creatinine Clearance: 33.1 mL/min (by C-G formula based on SCr of 2.51 mg/dL). Liver Function Tests:  Recent Labs Lab 11/18/15 2246  AST 67*  ALT 81*  ALKPHOS 75   BILITOT 3.1*  PROT 9.6*  ALBUMIN 4.7   Urine analysis:    Component Value Date/Time   COLORURINE YELLOW 04/28/2012 2133   APPEARANCEUR CLEAR 04/28/2012 2133   LABSPEC 1.025 04/10/2014 1914   PHURINE 5.5 04/10/2014 1914   GLUCOSEU NEGATIVE 04/10/2014 1914   HGBUR NEGATIVE 04/10/2014 1914   BILIRUBINUR NEGATIVE 04/10/2014 1914   KETONESUR NEGATIVE 04/10/2014 1914   PROTEINUR NEGATIVE 04/10/2014 1914   UROBILINOGEN 0.2 04/10/2014 1914   NITRITE NEGATIVE 04/10/2014 1914   LEUKOCYTESUR NEGATIVE 04/10/2014 1914     Assessment/Plan Principal Problem:   AKI (acute kidney injury) (HCC) Admit to telemetry/observation. BUN/creatinine ratio over 20. Continue IV hydration with 1/2 NS. Check urinalysis. Monitor intake and output. Follow-up BUN/creatinine and electrolytes.  Active Problems:     Hypernatremia with volume depletion  Received 2 L normal saline bolus in the emergency department. Continue IV hydration with 1/2 NS. Follow-up BUN/creatinine and electrolytes.    Polycythemia Her to volume the patient. Continue IV hydration. Follow-up CBC in the morning.    Type 2 diabetes mellitus (HCC) CBG monitoring before meals.    Schizoaffective disorder (HCC)   Depression The patient is currently IVC. He has not been taking his regular medications. Per wife, he required ECT previously for management. Secondary school teacherne-to-one sitter. Ativan 1 mg IVPB every 4 hours when necessary for restlessness. Consult psychiatry in the morning.   DVT prophylaxis: Lovenox SQ. Code Status: Full code. Family Communication:  [place],Wanda  [phone]  [phone]  Disposition Plan: Admit for IV hydration, further evaluation and subsequent medical clearance for psychiatric admission. Consults called:  Admission status: Inpatient/telemetry.   Bobette Moavid Manuel Jaiden Wahab MD [facility] Pager [phone].  If 7PM-7AM, please contact night-coverage [email or web address] Password [email or web address]  11/19/2015,  1:14 AM

## 2015-11-19 NOTE — Progress Notes (Signed)
Initial Nutrition Assessment  DOCUMENTATION CODES:   Severe malnutrition in context of acute illness/injury  INTERVENTION:  - Continue to encourage PO intakes. - Will order oral nutrition supplements per pt preference once PO intakes improve.   NUTRITION DIAGNOSIS:   Inadequate oral intake related to acute illness as evidenced by per patient/family report.  GOAL:   Patient will meet greater than or equal to 90% of their needs  MONITOR:   PO intake, Weight trends, Labs, I & O's  REASON FOR ASSESSMENT:   Malnutrition Screening Tool  ASSESSMENT:   65 y.o. male with medical history significant of dementia,  type 2 diabetes, hypertension, major depression, schizoaffective disorder who was brought by EMS his wife to the emergency department due to progressively worse depression for the past few months. Per patient's wife, for the past few months the patient has been isolating himself, secluded, sad, with very poor oral intake and a weight loss of 20 pounds. He has not taking a shower in some time. He has not been compliant with his medications. He had a similar picture in 2016 which require admission to the psychiatry service where he received ECT and medications  Pt seen for MST. BMI indicates overweight status. No intakes documented since admission. No family/visitors present. Spoke with sitter who states pt did not consume anything for breakfast and shortly before RD visit pt had eaten ~50% of a dinner roll and mashed potatoes. Tech denies pt having any difficulty swallowing these items. She states that pt's wife is coming later today and plan to work with her to order items pt likes for dinner.   Pt in 4 point soft restraints; defer physical assessment at this time. Per wife's report per notes, pt has lost 20 lbs in the past few months. Based on CBW, this indicates 9.4% body weight loss which is likely significant for time frame. No weight in pt's chart since July 2016. Pt meets  criteria for malnutrition based on weight loss and likely <50% intakes for >/=5 days based on information available in the chart.  Once PO intakes improve and/or pt communicating with staff, will order oral nutrition supplements. May need to monitor for refeeding if PO intakes improve quickly.   Medications reviewed; sliding scale Novolog. Labs reviewed; Na: 157 mmol/L, Cl: 117 mmol/L, BUN: 68 mg/dL, creatinine: 1.611.89 mg/dL, AST/ALT elevated, GFR: 42 mL/min.  IVF: D5 @ 125 mL/hr (510 kcal).   Diet Order:  Diet regular Room service appropriate? Yes; Fluid consistency: Thin  Skin:  Reviewed, no issues  Last BM:  PTA  Height:   Ht Readings from Last 1 Encounters:  11/19/15 6' (1.829 m)    Weight:   Wt Readings from Last 1 Encounters:  11/19/15 193 lb (87.5 kg)    Ideal Body Weight:  80.91 kg  BMI:  Body mass index is 26.18 kg/m.  Estimated Nutritional Needs:   Kcal:  1700-1900  Protein:  75-85 grams  Fluid:  2 L/day  EDUCATION NEEDS:   No education needs identified at this time    Trenton GammonJessica Abbegale Stehle, MS, RD, LDN Inpatient Clinical Dietitian Pager # [phone] After hours/weekend pager # [phone]

## 2015-11-19 NOTE — Consult Note (Addendum)
[facility] Face-to-Face Psychiatry Consult   Reason for Consult: depression  Referring Physician:  Dr. Wynelle Cleveland Patient Identification: Stanley Watkins MRN:  [id number] Principal Diagnosis: AKI (acute kidney injury) [facility]) Diagnosis:   Patient Active Problem List   Diagnosis Date Noted  . AKI (acute kidney injury) ([place]) [N17.9] 11/19/2015  . Hypernatremia [E87.0] 11/19/2015  . Polycythemia [D75.1] 11/19/2015  . Type 2 diabetes mellitus ([place]) [E11.9] 11/19/2015  . Sinus tachycardia ([place]) [R00.0] 11/19/2015  . Depression [F32.9]   . Encounter for preadmission testing [Z01.818]   . Schizoaffective disorder ([place]) [F25.9]     Total Time spent with patient: 20 minutes  Subjective:   Stanley Watkins is a 65 y.o. male patient admitted under IVC , generated by wife. Admitted to Medical Unit due to dehydration, AKI ( Acute Kidney Injury ) .  HPI:  Patient is a 65 year old male, who has history of depression.  He was brought to ED under IVC generated by wife, due to refusing to talk, becoming selectively mute, not eating, not drinking, neglecting basic ADLs, not taking medications. Reportedly has lost 20 lbs recently . Admitted to inpatient medical unit, due to dehydration, AKI. As per chart information, patient has history of depression, has been diagnosed with Schizoaffective Disorder in the past, and has a history of ECT treatment in 2016. At this time patient is awake ( as evidenced by moving his head, holding on to bed rails, squeezing eyes shut when asked to open eyes ) but presents selectively mute and does not answer any questions. As reviewed with Sitter- was somewhat agitated earlier, trying to remove foley catheter . Has not been communicative . I attempted to reach wife ( Stanley Watkins ) via phone , for further collateral information. No answer, left message requesting call back.    Past Psychiatric History:  History of depression, history of Schizoaffective Disorder, as  per chart, history of prior ECT treatment  As per chart review, he had a psychiatric admission in 2014- at that time he also initially presented uncooperative and non verbal . He was discharged on Prolixin, Zoloft, Ativan .  It is unknown at this time when patient stopped these medications , PTA medication list indicates he was not taking any medication .   Risk to Self: Is patient at risk for suicide?: No, but patient needs Medical Clearance Risk to Others:   Prior Inpatient Therapy:   Prior Outpatient Therapy:    Past Medical History:  Past Medical History:  Diagnosis Date  . Dementia   . Depression, major ([place])   . Diabetes mellitus without complication ([place])    pre diabetes  . Hypertension    non complaint  . Schizoaffective disorder ([place])    History reviewed. No pertinent surgical history. Family History:  Family History  Problem Relation Age of Onset  . Diabetes Mother   . Cancer Father   . Schizophrenia Maternal Aunt    Family Psychiatric  History: unknown at this time  Social History:  History  Alcohol Use No     History  Drug use: Unknown    Social History   Social History  . Marital status: Married    Spouse name: N/A  . Number of children: N/A  . Years of education: N/A   Social History Main Topics  . Smoking status: Never Smoker  . Smokeless tobacco: Never Used  . Alcohol use No  . Drug use: Unknown  . Sexual activity: Not Currently   Other Topics  Concern  . None   Social History Narrative  . None   Additional Social History:    Allergies:  No Known Allergies  Labs:  Results for orders placed or performed during the hospital encounter of 11/18/15 (from the past 48 hour(s))  Comprehensive metabolic panel     Status: Abnormal   Collection Time: 11/18/15 10:46 PM  Result Value Ref Range   Sodium 154 (H) 135 - 145 mmol/L   Potassium 4.4 3.5 - 5.1 mmol/L   Chloride 114 (H) 101 - 111 mmol/L   CO2 25 22 - 32 mmol/L   Glucose, Bld 158 (H) 65 -  99 mg/dL   BUN 81 (H) 6 - 20 mg/dL   Creatinine, Ser 2.51 (H) 0.61 - 1.24 mg/dL   Calcium 9.9 8.9 - 10.3 mg/dL   Total Protein 9.6 (H) 6.5 - 8.1 g/dL   Albumin 4.7 3.5 - 5.0 g/dL   AST 67 (H) 15 - 41 U/L   ALT 81 (H) 17 - 63 U/L   Alkaline Phosphatase 75 38 - 126 U/L   Total Bilirubin 3.1 (H) 0.3 - 1.2 mg/dL   GFR calc non Af Amer 26 (L) >60 mL/min   GFR calc Af Amer 30 (L) >60 mL/min    Comment: (NOTE) The eGFR has been calculated using the CKD EPI equation. This calculation has not been validated in all clinical situations. eGFR's persistently <60 mL/min signify possible Chronic Kidney Disease.    Anion gap 15 5 - 15  Ethanol     Status: None   Collection Time: 11/18/15 10:46 PM  Result Value Ref Range   Alcohol, Ethyl (B) <5 <5 mg/dL    Comment:        LOWEST DETECTABLE LIMIT FOR SERUM ALCOHOL IS 5 mg/dL FOR MEDICAL PURPOSES ONLY   Salicylate level     Status: None   Collection Time: 11/18/15 10:46 PM  Result Value Ref Range   Salicylate Lvl <1.5 2.8 - 30.0 mg/dL  Acetaminophen level     Status: Abnormal   Collection Time: 11/18/15 10:46 PM  Result Value Ref Range   Acetaminophen (Tylenol), Serum <10 (L) 10 - 30 ug/mL    Comment:        THERAPEUTIC CONCENTRATIONS VARY SIGNIFICANTLY. A RANGE OF 10-30 ug/mL MAY BE AN EFFECTIVE CONCENTRATION FOR MANY PATIENTS. HOWEVER, SOME ARE BEST TREATED AT CONCENTRATIONS OUTSIDE THIS RANGE. ACETAMINOPHEN CONCENTRATIONS >150 ug/mL AT 4 HOURS AFTER INGESTION AND >50 ug/mL AT 12 HOURS AFTER INGESTION ARE OFTEN ASSOCIATED WITH TOXIC REACTIONS.   cbc     Status: Abnormal   Collection Time: 11/18/15 10:46 PM  Result Value Ref Range   WBC 9.8 4.0 - 10.5 K/uL   RBC 7.60 (H) 4.22 - 5.81 MIL/uL   Hemoglobin 20.6 (H) 13.0 - 17.0 g/dL   HCT 62.4 (H) 39.0 - 52.0 %   MCV 82.1 78.0 - 100.0 fL   MCH 27.1 26.0 - 34.0 pg   MCHC 33.0 30.0 - 36.0 g/dL   RDW 16.8 (H) 11.5 - 15.5 %   Platelets 139 (L) 150 - 400 K/uL    Comment: REPEATED  TO VERIFY SPECIMEN CHECKED FOR CLOTS PLATELET COUNT CONFIRMED BY SMEAR   Comprehensive metabolic panel     Status: Abnormal   Collection Time: 11/19/15  4:31 AM  Result Value Ref Range   Sodium 157 (H) 135 - 145 mmol/L   Potassium 4.4 3.5 - 5.1 mmol/L   Chloride 117 (H) 101 - 111 mmol/L  CO2 27 22 - 32 mmol/L   Glucose, Bld 125 (H) 65 - 99 mg/dL   BUN 68 (H) 6 - 20 mg/dL   Creatinine, Ser 1.89 (H) 0.61 - 1.24 mg/dL   Calcium 8.8 (L) 8.9 - 10.3 mg/dL   Total Protein 8.0 6.5 - 8.1 g/dL   Albumin 3.9 3.5 - 5.0 g/dL   AST 55 (H) 15 - 41 U/L   ALT 67 (H) 17 - 63 U/L   Alkaline Phosphatase 63 38 - 126 U/L   Total Bilirubin 3.0 (H) 0.3 - 1.2 mg/dL   GFR calc non Af Amer 36 (L) >60 mL/min   GFR calc Af Amer 42 (L) >60 mL/min    Comment: (NOTE) The eGFR has been calculated using the CKD EPI equation. This calculation has not been validated in all clinical situations. eGFR's persistently <60 mL/min signify possible Chronic Kidney Disease.    Anion gap 13 5 - 15    Current Facility-Administered Medications  Medication Dose Route Frequency Provider Last Rate Last Dose  . dextrose 5 % solution   Intravenous Continuous Debbe Odea, MD      . enoxaparin (LOVENOX) injection 40 mg  40 mg Subcutaneous Q24H Reubin Milan, MD      . insulin aspart (novoLOG) injection 0-5 Units  0-5 Units Subcutaneous QHS Saima Rizwan, MD      . insulin aspart (novoLOG) injection 0-9 Units  0-9 Units Subcutaneous TID WC Saima Rizwan, MD      . LORazepam (ATIVAN) injection 1 mg  1 mg Intravenous Q4H PRN Reubin Milan, MD      . sodium chloride flush (NS) 0.9 % injection 3 mL  3 mL Intravenous Q12H Reubin Milan, MD   3 mL at 11/19/15 0143    Musculoskeletal: Strength & Muscle Tone: at this time in bed, non cooperative , moves extremities  Gait & Station: in bed , gait not examined  Patient leans: N/A  Psychiatric Specialty Exam: based on presentation , lack of communication, cannot obtain  full MSE at this time  Physical Exam  ROS unable to obtain due to patient being non communicative   Blood pressure (!) 136/97, pulse (!) 118, temperature 98 F (36.7 C), temperature source Axillary, resp. rate 20, height 6' (1.829 m), weight 193 lb (87.5 kg), SpO2 97 %.Body mass index is 26.18 kg/m.  General Appearance: Fairly Groomed  Eye Contact:  None- of note, when asked to open eyes, patient  squeezes eyes shut   Speech:  none - presents selectively mute   Volume:  NA  Mood:  depressed as per chart  information   Affect:  Blunt  Thought Process:  Unable to assess   Orientation: unable to assess   Thought Content:  Unable to assess   Suicidal Thoughts:  Unable to assess   Homicidal Thoughts:   Unable to assess   Memory:  Unable to assess   Judgement:  Impaired  Insight:  Lacking  Psychomotor Activity:  Decreased  Concentration:  Concentration: unable to assess  and Attention Span: unable to assess   Recall:  unable to assess  Fund of Knowledge:  unable to assess   Language:  Poor   Akathisia:  No  Handed: unknown   AIMS (if indicated):     Assets:  Resilience Social Support  ADL's:  Impaired  Cognition:  Unable to assess    Sleep:      Assessment - patient is a 65 year old married male,  admitted under commitment generated by wife due to decreased PO intake, significant weight loss, neglect of basic ADLs, and not communicating verbally, isolating . As per chart , has history of depression, and required ECT treatment in the past . At this time patient selectively mute, not making eye contact with writer , not answering any questions or following any requests . He does appear awake and aware, as squeezes eyes shut when asked to open eyes, and at times holds on to bed hand rail . Obtaining a full MSE is not possible at this time due to patient's presentation   Treatment Plan Summary: Daily contact with patient to assess and evaluate symptoms and progress in  treatment  Disposition: patient may need psychiatric admission once medically stable.  Will attempt to obtain further collateral information from wife - if patient responded well to ECT in the past, and tolerated this treatment modality well, recommendation would be to be treated with ECT ,/ seek inpatient unit that offers ECT treatment once he is medically stable . Agree with Ativan ( IV) PRNs for restlessness or agitation. Would consider starting standing Ativan ( ie : 0.5 mgrs Q 8 hours , if medically indicated ) as well  At this time would defer recommendation regarding which  Antidepressant/ antipsychotic  medication to use, until I can obtain further information from wife .  Would consider Neurology Consult to assess whether there may be neurological reason/etiology for changes in behavior and speech May consider obtaining  Vitamin B12, Folate, and Vitamin D levels , and if low, initiate supplementation. Psychiatry will follow with you . Neita Garnet, MD 11/19/2015 11:17 AM

## 2015-11-19 NOTE — Plan of Care (Signed)
Problem: Nutrition: Goal: Adequate nutrition will be maintained Outcome: Progressing Pt has experienced  some episodes of vomiting this am, will continue to monitor

## 2015-11-19 NOTE — Progress Notes (Signed)
Sitter is at bedside. Pt was not agreeable to cbg being taken this am.

## 2015-11-19 NOTE — Progress Notes (Signed)
PROGRESS NOTE    Stanley Watkins  QQV:[id number] DOB: [birth year] DOA: 11/18/2015  PCP: Dorrene GermanEdwin A Avbuere, MD   Brief Narrative:  Stanley Watkins is a 65 y.o. male with medical history significant of dementia,  type 2 diabetes, hypertension, major depression, schizoaffective disorder who was brought by EMS his wife to the emergency department due to progressively worse depression for the past few months. Per patient's wife, for the past few months the patient has been isolating himself, secluded, sad, with very poor oral intake and a weight loss of 20 pounds. He has not taking a shower in some time. He has not been compliant with his medications. He had a similar picture in 2016 which require admission to the psychiatry service where he received ECT and medications.   Subjective: Non-verbal.   Assessment & Plan:   Principal Problem:   AKI (acute kidney injury) - has not urinated since being admitted- bladder scan shows > 600 cc of urine - will place foley cath- BOO along with poor oral intake may be contributing to AKI  Active Problems:  Urinary retention - BPH? Foley- start Flomax  Hypernatremia - change 1/2 NS to D5W  Sinus tachycardia  -  Due to dehydration? Cont IVF  Schizoaffective disorder /  Depression - has not been taking home meds- no meds on home meds list   Polycythemia - likely hemoconcentration    Type 2 diabetes mellitus  - place on low dose sliding scale   DVT prophylaxis: Lovenox Code Status: Full code Family Communication:  Disposition Plan: will likely need to go to behavioral medicine Consultants:   psych Procedures:    Antimicrobials:  Anti-infectives    None       Objective: Vitals:   11/18/15 2338 11/18/15 2340 11/19/15 0130 11/19/15 0555  BP: (!) 120/104  (!) 159/103 (!) 136/97  Pulse: (!) 133  106 (!) 118  Resp:  19 20 20   Temp:   97.8 F (36.6 C) 98 F (36.7 C)  TempSrc:   Oral Axillary  SpO2: 94%  99% 97%    Weight:   87.5 kg (193 lb)   Height:   6' (1.829 m)     Intake/Output Summary (Last 24 hours) at 11/19/15 1101 Last data filed at 11/19/15 0700  Gross per 24 hour  Intake           1292.5 ml  Output                0 ml  Net           1292.5 ml   Filed Weights   11/18/15 2217 11/19/15 0130  Weight: 86.2 kg (190 lb) 87.5 kg (193 lb)    Examination: General exam: Appears comfortable  HEENT: PERRLA, oral mucosa moist, no sclera icterus or thrush Respiratory system: Clear to auscultation. Respiratory effort normal. Cardiovascular system: S1 & S2 heard, RRR.  No murmurs  Gastrointestinal system: Abdomen soft, non-tender, nondistended. Normal bowel sound. No organomegaly Central nervous system: Alert - unable to tell if he is oriented as non-verbal- No focal neurological deficits. Extremities: No cyanosis, clubbing or edema Skin: No rashes or ulcers Psychiatry:  Does not make eye contact or communicate     Data Reviewed: I have personally reviewed following labs and imaging studies  CBC:  Recent Labs Lab 11/18/15 2246  WBC 9.8  HGB 20.6*  HCT 62.4*  MCV 82.1  PLT 139*   Basic Metabolic Panel:  Recent Labs Lab 11/18/15 2246  11/19/15 0431  NA 154* 157*  K 4.4 4.4  CL 114* 117*  CO2 25 27  GLUCOSE 158* 125*  BUN 81* 68*  CREATININE 2.51* 1.89*  CALCIUM 9.9 8.8*   GFR: Estimated Creatinine Clearance: 43.9 mL/min (by C-G formula based on SCr of 1.89 mg/dL). Liver Function Tests:  Recent Labs Lab 11/18/15 2246 11/19/15 0431  AST 67* 55*  ALT 81* 67*  ALKPHOS 75 63  BILITOT 3.1* 3.0*  PROT 9.6* 8.0  ALBUMIN 4.7 3.9   No results for input(s): LIPASE, AMYLASE in the last 168 hours. No results for input(s): AMMONIA in the last 168 hours. Coagulation Profile: No results for input(s): INR, PROTIME in the last 168 hours. Cardiac Enzymes: No results for input(s): CKTOTAL, CKMB, CKMBINDEX, TROPONINI in the last 168 hours. BNP (last 3 results) No results for  input(s): PROBNP in the last 8760 hours. HbA1C: No results for input(s): HGBA1C in the last 72 hours. CBG: No results for input(s): GLUCAP in the last 168 hours. Lipid Profile: No results for input(s): CHOL, HDL, LDLCALC, TRIG, CHOLHDL, LDLDIRECT in the last 72 hours. Thyroid Function Tests: No results for input(s): TSH, T4TOTAL, FREET4, T3FREE, THYROIDAB in the last 72 hours. Anemia Panel: No results for input(s): VITAMINB12, FOLATE, FERRITIN, TIBC, IRON, RETICCTPCT in the last 72 hours. Urine analysis:    Component Value Date/Time   COLORURINE YELLOW 04/28/2012 2133   APPEARANCEUR CLEAR 04/28/2012 2133   LABSPEC 1.025 04/10/2014 1914   PHURINE 5.5 04/10/2014 1914   GLUCOSEU NEGATIVE 04/10/2014 1914   HGBUR NEGATIVE 04/10/2014 1914   BILIRUBINUR NEGATIVE 04/10/2014 1914   KETONESUR NEGATIVE 04/10/2014 1914   PROTEINUR NEGATIVE 04/10/2014 1914   UROBILINOGEN 0.2 04/10/2014 1914   NITRITE NEGATIVE 04/10/2014 1914   LEUKOCYTESUR NEGATIVE 04/10/2014 1914   Sepsis Labs: @LABRCNTIP (procalcitonin:4,lacticidven:4) )No results found for this or any previous visit (from the past 240 hour(s)).       Radiology Studies: No results found.    Scheduled Meds: . enoxaparin (LOVENOX) injection  40 mg Subcutaneous Q24H  . sodium chloride flush  3 mL Intravenous Q12H   Continuous Infusions: . sodium chloride 150 mL/hr at 11/19/15 0143     LOS: 0 days    Time spent in minutes: 35    Rose-Marie Hickling, MD [facility] Pager: [email or web address] Password [email or web address] 11/19/2015, 11:01 AM

## 2015-11-19 NOTE — Clinical Social Work Psych Assess (Signed)
Clinical Social Work Librarian, academicsych Service Line Assessment  Clinical Social Worker:  Clearance CootsNicole A Chrisy Hillebrand, LCSW Date/Time:  11/19/2015, 3:09 PM Referred By:  Care Management Date Referred:  11/19/15 Reason for Referral:  Behavioral Health Issues   Presenting Symptoms/Problems  Presenting Symptoms/Problems(in person's/family's own words):  Patient Wife reports, " I always have to take commitment paper out when he gets like this. He has not said a word for the last month, he refuses to eat or complete his ADL's."  Patient has  medical history significant of dementia,  type 2 diabetes, hypertension, major depression, schizoaffective disorder who was brought by EMS his wife to the emergency department due to progressively worse depression for the past few months.  Per patient's wife, for the past few months the patient has been isolating himself, secluded, sad, with very poor oral intake and a weight loss of 20 pounds. He has not taking a shower in some time. He has not been compliant with his medications. He had a similar picture in 2016 which require admission to the psychiatry service where he received ECT and medications.     Abuse/Neglect/Trauma History  Abuse/Neglect/Trauma History:   Loss of Parents Abuse/Neglect/Trauma History Comments (indicate dates): Patient wife reports patient lost his mother nine years ago and his father two after, since then the patient has struggled. Patient recently retired and has had difficulty adjusting to the lifestyle.    Psychiatric History  Psychiatric History:  Inpatient/Hospitalization, Outpatient Treatment Psychiatric Medication: She reports the patient was taking Sertraline, Klonopin and prescribed others but pt. Refused to fill the prescriptions. Patient reports the patient has never been consistent with taking his medication.    Current Mental Health Hospitalizations/Previous Mental Health History:  She reports the patient been to several inpatient  facilities in the past forty years.    Current Provider: [facility] and Date:    Current Medications:    Legend:                    Inactive   Active   Linked          Medications 11/19/15 11/20/15 11/21/15 11/22/15 11/23/15 11/24/15 11/25/15  enoxaparin (LOVENOX) injection 40 mg Dose: 40 mg Freq: Every 24 hours Route: [place] Start: 11/19/15 1000   Admin Instructions:  Pharmacy may adjust. Do NOT expel air bubble from syringe before giving.   1100    1000    1000    1000    1000    1000    1000     feeding supplement (ENSURE ENLIVE) (ENSURE ENLIVE) liquid 237 mL Dose: 237 mL Freq: 2 times daily between meals Route: PO Start: 11/19/15 1500   1500    1000   1500    1000   1500    1000   1500    1000   1500    1000   1500    1000   1500     insulin aspart (novoLOG) injection 0-5 Units Dose: 0-5 Units Freq: Daily at bedtime Route: [place] Start: 11/19/15 2200   Admin Instructions:  Do NOT hold insulin if patient is NPO.   2200    2200    2200    2200    2200    2200    2200     insulin aspart (novoLOG) injection 0-9 Units Dose: 0-9 Units Freq: 3 times daily with meals Route:  Start: 11/19/15 1200   Admin Instructions:  Do NOT hold if  patient is NPO. Sensitive Scale.   1200   1700    0800   1200   1700    0800   1200   1700    0800   1200   1700    0800   1200   1700    0800   1200   1700    0800   1200   1700     sodium chloride flush (NS) 0.9 % injection 3 mL Dose: 3 mL Freq: Every 12 hours Route: IV Start: 11/19/15 0145   0143   1000   2200    1000   2200    1000   2200    1000   2200    1000   2200    1000   2200    1000   2200     Medications 11/19/15 11/20/15 11/21/15 11/22/15 11/23/15 11/24/15 11/25/15        Previous Inpatient Admission/Date/Reason:  The last admission [place]/ [place] and received ECT.    Emotional  Health/Current Symptoms  Suicide/Self Harm: None Reported Suicide Attempt in Past (date/description):  None reported.   Other Harmful Behavior (ex. homicidal ideation) (describe):  None reported.    Psychotic/Dissociative Symptoms  Psychotic/Dissociative Symptoms: Inability to Care for Self Other Psychotic/Dissociative Symptoms:  She reports the patient "just lays in the bed, he does not eat. I have to clean up after him. Sometimes he will not let me do that."   Attention/Behavioral Symptoms  Attention/Behavioral Symptoms: Withdrawn, Inattentive Other Attention/Behavioral Symptoms:  Patient is withdrawn and will not communicate.   Cognitive Impairment  Cognitive Impairment:  Unable to Accurately Assess Other Cognitive Impairment:  She reports the patient may have Dementia. She reports while at behavioral health they informed her that the patient may have early signs of demential. "He forgets where he is and does not remember how to get to places."   Mood and Adjustment  Mood and Adjustment:  Unable to Accurately Assess   Stress, Anxiety, Trauma, Any Recent Loss/Stressor  Stress, Anxiety, Trauma, Any Recent Loss/Stressor: Avoidance Anxiety (frequency):    Phobia (specify):   Compulsive Behavior (specify):   Obsessive Behavior (specify):   Other Stress, Anxiety, Trauma, Any Recent Loss/Stressor:  She reports the patient has not adjusted well with retirement "he liked to work."   Substance Abuse/Use  Substance Abuse/Use: None SBIRT Completed (please refer for detailed history): No Self-reported Substance Use (last use and frequency):  None reported.   Urinary Drug Screen Completed: Yes Alcohol Level:  >5   Environment/Housing/Living Arrangement  Environmental/Housing/Living Arrangement: Stable Housing Who is in the Home:  Spouse/Legal Guardian  Emergency Contact:  Rahal,Wanda  [phone]  [phone]       Financial  Financial:  Medicare   Patient's Strengths and Goals  Patient's Strengths and Goals (patient's own words):  "I have been through several times with him, I know the routine."   Clinical Social Worker's Interpretive Summary  Clinical Social Workers Interpretive Summary:  Patient wife explained the patient has not been communicating with anyone. She reports, "he gradually gets this way, I do not know what causes it."She reports the patient has a significant history of similar behaviors and had ECT treatment and medications provided to help. She report the patient had his first mental break about thirty years ago from job stress. She expressed the patient has been laying in the bed for about 4 weeks refusing to eat or take care of his hygiene. She  reported the patient became dehydrated and weak, she went to magistrate to have pt. IVC'ed to get him to the hospital. She reports the patient refused to take his medication consistently, "he feels better for two weeks and then he feels he does not need the medication." She wants reports she is familiar the inpatient process and understands her husband will probably need inpatient behavioral treatment once medically stable.  LCSWA provided emotional support.    Disposition  Disposition: Psychiatrist MD Recommendation.

## 2015-11-19 NOTE — Care Management Note (Signed)
Case Management Note  Patient Details  Name: Stanley Watkins MRN: [id number] Date of Birth: 08/20/1950  Subjective/Objective:  65 y/o m admitted w/Hypernatremia, Severe depression. IVC,4 point restraints.Psych cons.Psych CSW notified.                  Action/Plan:d/c plan IP Psych.   Expected Discharge Date:                  Expected Discharge Plan:  Psychiatric Hospital  In-House Referral:  Clinical Social Work  Discharge planning Services  CM Consult  Post Acute Care Choice:    Choice offered to:     DME Arranged:    DME Agency:     HH Arranged:    HH Agency:     Status of Service:  In process, will continue to follow  If discussed at Long Length of Stay Meetings, dates discussed:    Additional Comments:  Lanier ClamMahabir, Becca Bayne, RN 11/19/2015, 9:44 AM

## 2015-11-19 NOTE — Progress Notes (Addendum)
Patient refused to speak with CSW. LCSWA left voicemail for Michiel SitesWanda Sutherland: [phone] for collateral information.   Vivi BarrackNicole Monzerat Handler, Theresia MajorsLCSWA, MSW Clinical Social Worker 5E and Psychiatric Service Line [phone] 11/19/2015  12:17 PM

## 2015-11-19 NOTE — Progress Notes (Signed)
Patient has not voided. Bladder scan completed, 637 cc in bladder. Pt will not follow commands to attempt void. Will contact provider

## 2015-11-20 DIAGNOSIS — E870 Hyperosmolality and hypernatremia: Secondary | ICD-10-CM

## 2015-11-20 DIAGNOSIS — D751 Secondary polycythemia: Secondary | ICD-10-CM

## 2015-11-20 DIAGNOSIS — D696 Thrombocytopenia, unspecified: Secondary | ICD-10-CM

## 2015-11-20 DIAGNOSIS — F259 Schizoaffective disorder, unspecified: Secondary | ICD-10-CM

## 2015-11-20 DIAGNOSIS — E43 Unspecified severe protein-calorie malnutrition: Secondary | ICD-10-CM

## 2015-11-20 LAB — CBC
HCT: 49.2 % (ref 39.0–52.0)
HCT: 48.3 % (ref 39.0–52.0)
HEMOGLOBIN: 16.2 g/dL (ref 13.0–17.0)
Hemoglobin: 15.7 g/dL (ref 13.0–17.0)
MCH: 26.7 pg (ref 26.0–34.0)
MCH: 27.5 pg (ref 26.0–34.0)
MCHC: 31.9 g/dL (ref 30.0–36.0)
MCHC: 33.5 g/dL (ref 30.0–36.0)
MCV: 83.7 fL (ref 78.0–100.0)
MCV: 82 fL (ref 78.0–100.0)
PLATELETS: 77 10*3/uL — AB (ref 150–400)
Platelets: 78 10*3/uL — ABNORMAL LOW (ref 150–400)
RBC: 5.88 MIL/uL — AB (ref 4.22–5.81)
RBC: 5.89 MIL/uL — AB (ref 4.22–5.81)
RDW: 16.9 % — AB (ref 11.5–15.5)
RDW: 16.6 % — ABNORMAL HIGH (ref 11.5–15.5)
WBC: 6.5 10*3/uL (ref 4.0–10.5)
WBC: 5.8 10*3/uL (ref 4.0–10.5)

## 2015-11-20 LAB — COMPREHENSIVE METABOLIC PANEL
ALT: 65 U/L — ABNORMAL HIGH (ref 17–63)
AST: 59 U/L — AB (ref 15–41)
Albumin: 3.5 g/dL (ref 3.5–5.0)
Alkaline Phosphatase: 57 U/L (ref 38–126)
Anion gap: 6 (ref 5–15)
BILIRUBIN TOTAL: 2.4 mg/dL — AB (ref 0.3–1.2)
BUN: 36 mg/dL — ABNORMAL HIGH (ref 6–20)
CO2: 28 mmol/L (ref 22–32)
CREATININE: 1.35 mg/dL — AB (ref 0.61–1.24)
Calcium: 8.7 mg/dL — ABNORMAL LOW (ref 8.9–10.3)
Chloride: 113 mmol/L — ABNORMAL HIGH (ref 101–111)
GFR, EST NON AFRICAN AMERICAN: 54 mL/min — AB (ref >60)
Glucose, Bld: 134 mg/dL — ABNORMAL HIGH (ref 65–99)
POTASSIUM: 3.6 mmol/L (ref 3.5–5.1)
Sodium: 147 mmol/L — ABNORMAL HIGH (ref 135–145)
TOTAL PROTEIN: 7.2 g/dL (ref 6.5–8.1)

## 2015-11-20 LAB — HEMOGLOBIN A1C
HEMOGLOBIN A1C: 6.6 % — AB (ref 4.8–5.6)
Mean Plasma Glucose: 143 mg/dL

## 2015-11-20 LAB — GLUCOSE, CAPILLARY
GLUCOSE-CAPILLARY: 96 mg/dL (ref 65–99)
Glucose-Capillary: 139 mg/dL — ABNORMAL HIGH (ref 65–99)
Glucose-Capillary: 110 mg/dL — ABNORMAL HIGH (ref 65–99)
Glucose-Capillary: 144 mg/dL — ABNORMAL HIGH (ref 65–99)

## 2015-11-20 MED ORDER — SODIUM CHLORIDE 0.9 % IV SOLN: 250.0000 mL | INTRAVENOUS | Status: DC | PRN

## 2015-11-20 MED ORDER — SODIUM CHLORIDE 0.9% FLUSH: 3.0000 mL | INTRAVENOUS | Status: DC | PRN

## 2015-11-20 MED ORDER — SODIUM CHLORIDE 0.9% FLUSH
3.0000 mL | Freq: Two times a day (BID) | INTRAVENOUS | Status: DC
  Administered 2015-11-22 – 2015-11-23 (×2): 3 mL via INTRAVENOUS

## 2015-11-20 MED ORDER — SODIUM CHLORIDE 0.9 % IV BOLUS (SEPSIS)
1000.0000 mL | Freq: Once | INTRAVENOUS | Status: AC
  Administered 2015-11-20: 1000 mL via INTRAVENOUS

## 2015-11-20 NOTE — Progress Notes (Signed)
PROGRESS NOTE    Stanley Watkins  WJX:[id number] DOB: October 30, 1950 DOA: 11/18/2015  PCP: Dorrene GermanEdwin A Avbuere, MD   Brief Narrative:  Stanley RogueBennett A Jarnagin is a 65 y.o. male with medical history significant of dementia,  type 2 diabetes, hypertension, major depression, schizoaffective disorder who was brought by EMS his wife to the emergency department due to progressively worse depression for the past few months. Per patient's wife, for the past few months the patient has been isolating himself, secluded, sad, with very poor oral intake and a weight loss of 20 pounds. He has not taking a shower in some time. He has not been compliant with his medications. He had a similar picture in 2016 which require admission to the psychiatry service where he received ECT and medications.   Subjective: Non-verbal.   Assessment & Plan:   Principal Problem:   AKI (acute kidney injury) - baseline Cr 1.0 - 9/11 had not urinated since being admitted- bladder scan showed > 600 cc of urine- placed foley cath - BOO along with poor oral intake may be contributing to AKI - still has poor urine output today despite being on IVF- giving NS bolus of 1 L   Active Problems:  Urinary retention - BPH? Foley- start Flomax  Hypernatremia - changed 1/2 NS to D5W- sodium continues to improve  Sinus tachycardia  -  Due to dehydration? Cont IVF  Schizoaffective disorder /  Depression - has not been taking home meds- no meds on home meds list - once he is medically stable, he will need to be transferred to behavioral medicine- appreciate psych eval  Polycythemia - likely hemoconcentration - has improved with hydration  Thrombocytopenia - cause uncertain- lab work repeated to confirm finding -  have held Lovenox-send HITT panel as he has had a 50 % drop in platelets - SCDs for DVT prophylaxis - f/u CBC in AM- follow for signs of bleeding/ clotting  Type 2 diabetes mellitus  - noted in chart - placed on  low dose sliding scale   DVT prophylaxis: SCDS Code Status: Full code Family Communication:  Disposition Plan: will likely need to go to behavioral medicine Consultants:   psych Procedures:    Antimicrobials:  Anti-infectives    None       Objective: Vitals:   11/19/15 0555 11/19/15 1336 11/19/15 2142 11/20/15 0300  BP: (!) 136/97 127/86 (!) 124/92 102/75  Pulse: (!) 118 (!) 115  88  Resp: 20 18  18   Temp: 98 F (36.7 C) 97.7 F (36.5 C)  98.8 F (37.1 C)  TempSrc: Axillary Axillary  Oral  SpO2: 97% 98%  100%  Weight:      Height:        Intake/Output Summary (Last 24 hours) at 11/20/15 1333 Last data filed at 11/20/15 0655  Gross per 24 hour  Intake          3089.58 ml  Output              450 ml  Net          2639.58 ml   Filed Weights   11/18/15 2217 11/19/15 0130  Weight: 86.2 kg (190 lb) 87.5 kg (193 lb)    Examination: General exam: Appears comfortable - does not verbalize HEENT: PERRLA, oral mucosa moist, no sclera icterus or thrush Respiratory system: Clear to auscultation. Respiratory effort normal. Cardiovascular system: S1 & S2 heard, RRR.  No murmurs  Gastrointestinal system: Abdomen soft, non-tender, nondistended. Normal bowel sound.  No organomegaly Central nervous system: Alert - unable to tell if he is oriented as he is non-verbal- No focal neurological deficits. Extremities: No cyanosis, clubbing or edema Skin: No rashes or ulcers Psychiatry:  Does not make eye contact or communicate     Data Reviewed: I have personally reviewed following labs and imaging studies  CBC:  Recent Labs Lab 11/18/15 2246 11/20/15 0527 11/20/15 0921  WBC 9.8 6.5 5.8  HGB 20.6* 15.7 16.2  HCT 62.4* 49.2 48.3  MCV 82.1 83.7 82.0  PLT 139* 77* 78*   Basic Metabolic Panel:  Recent Labs Lab 11/18/15 2246 11/19/15 0431 11/20/15 0527  NA 154* 157* 147*  K 4.4 4.4 3.6  CL 114* 117* 113*  CO2 25 27 28   GLUCOSE 158* 125* 134*  BUN 81* 68* 36*    CREATININE 2.51* 1.89* 1.35*  CALCIUM 9.9 8.8* 8.7*   GFR: Estimated Creatinine Clearance: 61.5 mL/min (by C-G formula based on SCr of 1.35 mg/dL). Liver Function Tests:  Recent Labs Lab 11/18/15 2246 11/19/15 0431 11/20/15 0527  AST 67* 55* 59*  ALT 81* 67* 65*  ALKPHOS 75 63 57  BILITOT 3.1* 3.0* 2.4*  PROT 9.6* 8.0 7.2  ALBUMIN 4.7 3.9 3.5   No results for input(s): LIPASE, AMYLASE in the last 168 hours. No results for input(s): AMMONIA in the last 168 hours. Coagulation Profile: No results for input(s): INR, PROTIME in the last 168 hours. Cardiac Enzymes: No results for input(s): CKTOTAL, CKMB, CKMBINDEX, TROPONINI in the last 168 hours. BNP (last 3 results) No results for input(s): PROBNP in the last 8760 hours. HbA1C:  Recent Labs  11/19/15 0431  HGBA1C 6.6*   CBG:  Recent Labs Lab 11/19/15 1143 11/19/15 1640 11/19/15 2132 11/20/15 0901 11/20/15 1208  GLUCAP 130* 158* 131* 139* 110*   Lipid Profile: No results for input(s): CHOL, HDL, LDLCALC, TRIG, CHOLHDL, LDLDIRECT in the last 72 hours. Thyroid Function Tests: No results for input(s): TSH, T4TOTAL, FREET4, T3FREE, THYROIDAB in the last 72 hours. Anemia Panel: No results for input(s): VITAMINB12, FOLATE, FERRITIN, TIBC, IRON, RETICCTPCT in the last 72 hours. Urine analysis:    Component Value Date/Time   COLORURINE YELLOW 04/28/2012 2133   APPEARANCEUR CLEAR 04/28/2012 2133   LABSPEC 1.025 04/10/2014 1914   PHURINE 5.5 04/10/2014 1914   GLUCOSEU NEGATIVE 04/10/2014 1914   HGBUR NEGATIVE 04/10/2014 1914   BILIRUBINUR NEGATIVE 04/10/2014 1914   KETONESUR NEGATIVE 04/10/2014 1914   PROTEINUR NEGATIVE 04/10/2014 1914   UROBILINOGEN 0.2 04/10/2014 1914   NITRITE NEGATIVE 04/10/2014 1914   LEUKOCYTESUR NEGATIVE 04/10/2014 1914   Sepsis Labs: @LABRCNTIP (procalcitonin:4,lacticidven:4) )No results found for this or any previous visit (from the past 240 hour(s)).       Radiology Studies: No  results found.    Scheduled Meds: . feeding supplement (ENSURE ENLIVE)  237 mL Oral BID BM  . insulin aspart  0-5 Units Subcutaneous QHS  . insulin aspart  0-9 Units Subcutaneous TID WC  . sodium chloride flush  3 mL Intravenous Q12H   Continuous Infusions: . dextrose 125 mL/hr at 11/20/15 1116     LOS: 1 day    Time spent in minutes: 35    Wadie Mattie, MD [facility] Pager: [email or web address] Password [email or web address] 11/20/2015, 1:33 PM

## 2015-11-20 NOTE — Consult Note (Signed)
[facility] Face-to-Face Psychiatry Consult   Reason for Consult:  Depression,non communicative  Referring Physician:  Dr. Wynelle Cleveland  Patient Identification: Stanley Watkins MRN:  [id number] Principal Diagnosis: AKI (acute kidney injury) [facility]) Diagnosis:   Patient Active Problem List   Diagnosis Date Noted  . Thrombocytopenia ([place]) [D69.6] 11/20/2015  . AKI (acute kidney injury) ([place]) [N17.9] 11/19/2015  . Hypernatremia [E87.0] 11/19/2015  . Polycythemia [D75.1] 11/19/2015  . Type 2 diabetes mellitus ([place]) [E11.9] 11/19/2015  . Sinus tachycardia ([place]) [R00.0] 11/19/2015  . Protein-calorie malnutrition, severe [E43] 11/19/2015  . Depression [F32.9]   . Encounter for preadmission testing [Z01.818]   . Schizoaffective disorder ([place]) [F25.9]     Total Time spent with patient: 20 minutes  Subjective:   Stanley Watkins is a 65 y.o. male patient admitted with  AKI, dehydration  .  HPI:   Please refer to initial psychiatric consult dated 9/11 as well . Briefly, patient is a 65 year old man, admitted on IVC by wife, due to refusing to talk, eat, drink, or take care of ADLs. Today patient presents the same ( eyes closed, no eye contact, mute) , but appears awake and to be listening - for example , when asked to show me hands in order to determine if he is understanding speech, he folded them under sheets and turned face away. As per sitter, he has remained mute, but did sit up earlier and ate some of his meal .  No agitation at this time. I spoke with his wife, Stanley Watkins, via phone and she provided significant collateral information - patient has history of prior similar decompensations during which he stops talking, interacting, eating  , which she feels are essentially psychiatric and related to depression.  He has been on psychiatric medications in the past, but he had stopped taking them and had thrown them out months ago . At this time she cannot remember name of these meds . She states that in  the past, ECT has been the treatment that results in clinical improvement , and apparently has had at least two different ECT courses in the past, most recently at [facility] .In addition to his decompensations, she does feel that over the last two years or so there has been some cognitive deterioration , with loss of memory and episodes of getting lost while driving .   Past Psychiatric History: See above- history of schizoaffective disorder, depression, responded to ECT in the past   Risk to Self: Is patient at risk for suicide?: No, but patient needs Medical Clearance Risk to Others:   Prior Inpatient Therapy:   Prior Outpatient Therapy:    Past Medical History:  Past Medical History:  Diagnosis Date  . Dementia   . Depression, major ([place])   . Diabetes mellitus without complication ([place])    pre diabetes  . Hypertension    non complaint  . Schizoaffective disorder ([place])    History reviewed. No pertinent surgical history. Family History:  Family History  Problem Relation Age of Onset  . Diabetes Mother   . Cancer Father   . Schizophrenia Maternal Aunt    Family Psychiatric  History: non contributory  Social History:  History  Alcohol Use No     History  Drug use: Unknown    Social History   Social History  . Marital status: Married    Spouse name: N/A  . Number of children: N/A  . Years of education: N/A   Social History  Main Topics  . Smoking status: Never Smoker  . Smokeless tobacco: Never Used  . Alcohol use No  . Drug use: Unknown  . Sexual activity: Not Currently   Other Topics Concern  . None   Social History Narrative  . None   Additional Social History:    Allergies:  No Known Allergies  Labs:  Results for orders placed or performed during the hospital encounter of 11/18/15 (from the past 48 hour(s))  Comprehensive metabolic panel     Status: Abnormal   Collection Time: 11/18/15 10:46 PM  Result Value Ref Range   Sodium 154  (H) 135 - 145 mmol/L   Potassium 4.4 3.5 - 5.1 mmol/L   Chloride 114 (H) 101 - 111 mmol/L   CO2 25 22 - 32 mmol/L   Glucose, Bld 158 (H) 65 - 99 mg/dL   BUN 81 (H) 6 - 20 mg/dL   Creatinine, Ser 2.51 (H) 0.61 - 1.24 mg/dL   Calcium 9.9 8.9 - 10.3 mg/dL   Total Protein 9.6 (H) 6.5 - 8.1 g/dL   Albumin 4.7 3.5 - 5.0 g/dL   AST 67 (H) 15 - 41 U/L   ALT 81 (H) 17 - 63 U/L   Alkaline Phosphatase 75 38 - 126 U/L   Total Bilirubin 3.1 (H) 0.3 - 1.2 mg/dL   GFR calc non Af Amer 26 (L) >60 mL/min   GFR calc Af Amer 30 (L) >60 mL/min    Comment: (NOTE) The eGFR has been calculated using the CKD EPI equation. This calculation has not been validated in all clinical situations. eGFR's persistently <60 mL/min signify possible Chronic Kidney Disease.    Anion gap 15 5 - 15  Ethanol     Status: None   Collection Time: 11/18/15 10:46 PM  Result Value Ref Range   Alcohol, Ethyl (B) <5 <5 mg/dL    Comment:        LOWEST DETECTABLE LIMIT FOR SERUM ALCOHOL IS 5 mg/dL FOR MEDICAL PURPOSES ONLY   Salicylate level     Status: None   Collection Time: 11/18/15 10:46 PM  Result Value Ref Range   Salicylate Lvl <8.3 2.8 - 30.0 mg/dL  Acetaminophen level     Status: Abnormal   Collection Time: 11/18/15 10:46 PM  Result Value Ref Range   Acetaminophen (Tylenol), Serum <10 (L) 10 - 30 ug/mL    Comment:        THERAPEUTIC CONCENTRATIONS VARY SIGNIFICANTLY. A RANGE OF 10-30 ug/mL MAY BE AN EFFECTIVE CONCENTRATION FOR MANY PATIENTS. HOWEVER, SOME ARE BEST TREATED AT CONCENTRATIONS OUTSIDE THIS RANGE. ACETAMINOPHEN CONCENTRATIONS >150 ug/mL AT 4 HOURS AFTER INGESTION AND >50 ug/mL AT 12 HOURS AFTER INGESTION ARE OFTEN ASSOCIATED WITH TOXIC REACTIONS.   cbc     Status: Abnormal   Collection Time: 11/18/15 10:46 PM  Result Value Ref Range   WBC 9.8 4.0 - 10.5 K/uL   RBC 7.60 (H) 4.22 - 5.81 MIL/uL   Hemoglobin 20.6 (H) 13.0 - 17.0 g/dL   HCT 62.4 (H) 39.0 - 52.0 %   MCV 82.1 78.0 - 100.0 fL    MCH 27.1 26.0 - 34.0 pg   MCHC 33.0 30.0 - 36.0 g/dL   RDW 16.8 (H) 11.5 - 15.5 %   Platelets 139 (L) 150 - 400 K/uL    Comment: REPEATED TO VERIFY SPECIMEN CHECKED FOR CLOTS PLATELET COUNT CONFIRMED BY SMEAR   Comprehensive metabolic panel     Status: Abnormal   Collection Time: 11/19/15  4:31 AM  Result Value Ref Range   Sodium 157 (H) 135 - 145 mmol/L   Potassium 4.4 3.5 - 5.1 mmol/L   Chloride 117 (H) 101 - 111 mmol/L   CO2 27 22 - 32 mmol/L   Glucose, Bld 125 (H) 65 - 99 mg/dL   BUN 68 (H) 6 - 20 mg/dL   Creatinine, Ser 1.89 (H) 0.61 - 1.24 mg/dL   Calcium 8.8 (L) 8.9 - 10.3 mg/dL   Total Protein 8.0 6.5 - 8.1 g/dL   Albumin 3.9 3.5 - 5.0 g/dL   AST 55 (H) 15 - 41 U/L   ALT 67 (H) 17 - 63 U/L   Alkaline Phosphatase 63 38 - 126 U/L   Total Bilirubin 3.0 (H) 0.3 - 1.2 mg/dL   GFR calc non Af Amer 36 (L) >60 mL/min   GFR calc Af Amer 42 (L) >60 mL/min    Comment: (NOTE) The eGFR has been calculated using the CKD EPI equation. This calculation has not been validated in all clinical situations. eGFR's persistently <60 mL/min signify possible Chronic Kidney Disease.    Anion gap 13 5 - 15  Hemoglobin A1c     Status: Abnormal   Collection Time: 11/19/15  4:31 AM  Result Value Ref Range   Hgb A1c MFr Bld 6.6 (H) 4.8 - 5.6 %    Comment: (NOTE)         Pre-diabetes: 5.7 - 6.4         Diabetes: >6.4         Glycemic control for adults with diabetes: <7.0    Mean Plasma Glucose 143 mg/dL    Comment: (NOTE) Performed At: [facility] [address] [place], [place] [id number] [place] William F MD Ph:[phone]   Glucose, capillary     Status: Abnormal   Collection Time: 11/19/15 11:43 AM  Result Value Ref Range   Glucose-Capillary 130 (H) 65 - 99 mg/dL  Glucose, capillary     Status: Abnormal   Collection Time: 11/19/15  4:40 PM  Result Value Ref Range   Glucose-Capillary 158 (H) 65 - 99 mg/dL  Glucose, capillary     Status: Abnormal   Collection Time:  11/19/15  9:32 PM  Result Value Ref Range   Glucose-Capillary 131 (H) 65 - 99 mg/dL  Comprehensive metabolic panel     Status: Abnormal   Collection Time: 11/20/15  5:27 AM  Result Value Ref Range   Sodium 147 (H) 135 - 145 mmol/L    Comment: DELTA CHECK NOTED   Potassium 3.6 3.5 - 5.1 mmol/L    Comment: DELTA CHECK NOTED   Chloride 113 (H) 101 - 111 mmol/L   CO2 28 22 - 32 mmol/L   Glucose, Bld 134 (H) 65 - 99 mg/dL   BUN 36 (H) 6 - 20 mg/dL   Creatinine, Ser 1.35 (H) 0.61 - 1.24 mg/dL   Calcium 8.7 (L) 8.9 - 10.3 mg/dL   Total Protein 7.2 6.5 - 8.1 g/dL   Albumin 3.5 3.5 - 5.0 g/dL   AST 59 (H) 15 - 41 U/L   ALT 65 (H) 17 - 63 U/L   Alkaline Phosphatase 57 38 - 126 U/L   Total Bilirubin 2.4 (H) 0.3 - 1.2 mg/dL   GFR calc non Af Amer 54 (L) >60 mL/min   GFR calc Af Amer >60 >60 mL/min    Comment: (NOTE) The eGFR has been calculated using the CKD EPI equation. This calculation has not been validated   in all clinical situations. eGFR's persistently <60 mL/min signify possible Chronic Kidney Disease.    Anion gap 6 5 - 15  CBC     Status: Abnormal   Collection Time: 11/20/15  5:27 AM  Result Value Ref Range   WBC 6.5 4.0 - 10.5 K/uL   RBC 5.88 (H) 4.22 - 5.81 MIL/uL   Hemoglobin 15.7 13.0 - 17.0 g/dL    Comment: REPEATED TO VERIFY DELTA CHECK NOTED RESULT CHECKED    HCT 49.2 39.0 - 52.0 %   MCV 83.7 78.0 - 100.0 fL   MCH 26.7 26.0 - 34.0 pg   MCHC 31.9 30.0 - 36.0 g/dL   RDW 16.9 (H) 11.5 - 15.5 %   Platelets 77 (L) 150 - 400 K/uL    Comment: SPECIMEN CHECKED FOR CLOTS REPEATED TO VERIFY DELTA CHECK NOTED PLATELET COUNT CONFIRMED BY SMEAR   Glucose, capillary     Status: Abnormal   Collection Time: 11/20/15  9:01 AM  Result Value Ref Range   Glucose-Capillary 139 (H) 65 - 99 mg/dL  CBC     Status: Abnormal   Collection Time: 11/20/15  9:21 AM  Result Value Ref Range   WBC 5.8 4.0 - 10.5 K/uL   RBC 5.89 (H) 4.22 - 5.81 MIL/uL   Hemoglobin 16.2 13.0 - 17.0 g/dL    HCT 48.3 39.0 - 52.0 %   MCV 82.0 78.0 - 100.0 fL   MCH 27.5 26.0 - 34.0 pg   MCHC 33.5 30.0 - 36.0 g/dL   RDW 16.6 (H) 11.5 - 15.5 %   Platelets 78 (L) 150 - 400 K/uL    Comment: CONSISTENT WITH PREVIOUS RESULT  Glucose, capillary     Status: Abnormal   Collection Time: 11/20/15 12:08 PM  Result Value Ref Range   Glucose-Capillary 110 (H) 65 - 99 mg/dL  Glucose, capillary     Status: Abnormal   Collection Time: 11/20/15  5:28 PM  Result Value Ref Range   Glucose-Capillary 144 (H) 65 - 99 mg/dL    Current Facility-Administered Medications  Medication Dose Route Frequency Provider Last Rate Last Dose  . 0.9 %  sodium chloride infusion  250 mL Intravenous PRN Saima Rizwan, MD      . dextrose 5 % solution   Intravenous Continuous Saima Rizwan, MD 125 mL/hr at 11/20/15 1116    . feeding supplement (ENSURE ENLIVE) (ENSURE ENLIVE) liquid 237 mL  237 mL Oral BID BM Saima Rizwan, MD      . insulin aspart (novoLOG) injection 0-5 Units  0-5 Units Subcutaneous QHS Saima Rizwan, MD      . insulin aspart (novoLOG) injection 0-9 Units  0-9 Units Subcutaneous TID WC Saima Rizwan, MD   1 Units at 11/20/15 1738  . LORazepam (ATIVAN) injection 1 mg  1 mg Intravenous Q4H PRN David Manuel Ortiz, MD      . sodium chloride flush (NS) 0.9 % injection 3 mL  3 mL Intravenous Q12H David Manuel Ortiz, MD   3 mL at 11/19/15 0143  . sodium chloride flush (NS) 0.9 % injection 3 mL  3 mL Intravenous Q12H Saima Rizwan, MD      . sodium chloride flush (NS) 0.9 % injection 3 mL  3 mL Intravenous PRN Saima Rizwan, MD        Musculoskeletal: Strength & Muscle Tone: within normal limits Gait & Station: normal Patient leans: N/A  Psychiatric Specialty Exam: as yesterday, full MSE is unobtainable at this time due to lack   of speech, communication  Physical Exam  ROS  Blood pressure (!) 121/94, pulse 91, temperature 97.8 F (36.6 C), temperature source Oral, resp. rate 20, height 6' (1.829 m), weight 193 lb (87.5  kg), SpO2 100 %.Body mass index is 26.18 kg/m.  General Appearance: improved grooming today, in hospital garb   Eye Contact:  None  Speech:  none   Volume:  N/A  Mood:  unable to assess- as per wife, has been depressed   Affect:  Blunt  Thought Process:  NA  Orientation:  Unable to assess   Thought Content:  unable to assess  Suicidal Thoughts:  unable to assess   Homicidal Thoughts:  unable to assess   Memory:  unalbe to assess  Judgement:  Impaired  Insight:  Lacking  Psychomotor Activity:  Decreased- no agitation or restlessness at this time   Concentration:  Concentration: Poor and Attention Span: Poor  Recall:  Poor  Fund of Knowledge:  Poor  Language:  Poor  Akathisia:  No  Handed:  Right  AIMS (if indicated):     Assets:  Social Support  ADL's:  Impaired  Cognition:  As per non verbal gestures, movements, appears awake and alert.   Sleep:      Assessment - patient has history of prior similar decompensations (with negativism, refusal to eat /drink, social isolation and selective muteness) which wife states are related to his underlying psychiatric illness/depression . States he has not taken any psychiatric medications in months . She states that ECT has been effective in improving his symptoms. She also endorses there has been , independent of this episode, a gradual cognitive decline over recent 2-3 years .  Treatment Plan Summary:  see below   Disposition: Recommend psychiatric Inpatient admission when medically cleared. Once medically cleared, patient will benefit from inpatient psychiatric admission to an inpatient unit that offers ECT . Wife states that she will review his records and see what the last antidepressant he took was, and contact staff to convey information . If patient's PO intake continues to improve, and he starts accepting PO medication , would consider starting Remeron 7.5 mgrs QHS initially.   Neita Garnet, MD 11/20/2015 7:01 PM

## 2015-11-21 LAB — GLUCOSE, CAPILLARY
GLUCOSE-CAPILLARY: 122 mg/dL — AB (ref 65–99)
Glucose-Capillary: 135 mg/dL — ABNORMAL HIGH (ref 65–99)

## 2015-11-21 LAB — HEPARIN INDUCED PLATELET AB (HIT ANTIBODY): Heparin Induced Plt Ab: 0.228 OD (ref 0.000–0.400)

## 2015-11-21 NOTE — Care Management Note (Signed)
Case Management Note  Patient Details  Name: Nani SkillernBennett A Dewberry MRN: [id number] Date of Birth: 19-Sep-1950  Subjective/Objective: Recommend activity status to be addressed  prior to PT cons.                   Action/Plan:   Expected Discharge Date:   (unknown)               Expected Discharge Plan:  [facility]  In-House Referral:  Clinical Social Work  Discharge planning Services  CM Consult  Post Acute Care Choice:    Choice offered to:     DME Arranged:    DME Agency:     HH Arranged:    HH Agency:     Status of Service:  In process, will continue to follow  If discussed at Long Length of Stay Meetings, dates discussed:    Additional Comments:  Lanier ClamMahabir, Ulysses Alper, RN 11/21/2015, 2:25 PM

## 2015-11-21 NOTE — Progress Notes (Addendum)
Once medically cleared, patient will benefit from inpatient psychiatric admission to an inpatient unit that offers ECT . LCSWA will assit with dispostion  [facility] [place] [facility]

## 2015-11-21 NOTE — Progress Notes (Signed)
Patient refusing to respond or communicate.  Patient refusing to move and does not seem able to ambulate independently. Pt refusing blood sugar checks and medications. MD aware.

## 2015-11-21 NOTE — Progress Notes (Addendum)
PROGRESS NOTE    Stanley Watkins  ZOX:[id number] DOB: 12-05-1950 DOA: 11/18/2015  PCP: Dorrene German, MD   Brief Narrative:  65 y.o. male with medical history significant of dementia,  type 2 diabetes, hypertension, major depression, schizoaffective disorder who was brought by EMS his wife to the emergency department due to progressively worse depression for the past few months. Per patient's wife, for the past few months the patient has been isolating himself, secluded, sad, with very poor oral intake and a weight loss of 20 pounds. He has not taking a shower in some time. He has not been compliant with his medications. He had a similar picture in 2016 which required admission to the psychiatry service where he received ECT and medications.   Subjective: Non-verbal.   Assessment & Plan:   Principal Problem:   AKI (acute kidney injury) due to bladder outlet obstruction and poor oral intake  - baseline Cr 1.0 - 9/11 had not urinated since being admitted- bladder scan showed > 600 cc of urine- placed foley cath - Cr overall improving and pt currently appears euvolemic - will stop IVF to see how pt does - BMP in AM   Active Problems: Urinary retention - BPH? Foley - continue Flomax  Hypernatremia - changed 1/2 NS to D5W - Na trending down - BMP In AM  Sinus tachycardia  - likely reactive from dehydration - HR within target range this AM   Schizoaffective disorder /  Depression - has not been taking home meds- no meds on home meds list - psych consulted, recommend inpatient psych, placement in progress   Polycythemia - likely hemoconcentration - has improved with hydration  Thrombocytopenia - cause uncertain- lab work repeated to confirm finding - have held Lovenox-send HITT panel as he has had a 50 % drop in platelets - SCDs for DVT prophylaxis - no signs of active bleeding - CBC in AM  Type 2 diabetes mellitus, A1C 6.6 - placed on low dose sliding  scale   DVT prophylaxis: SCDS Code Status: Full code Family Communication: no family at bedside  Disposition Plan: needs inpatient psych placement, work in progress   Consultants:   Psych  Procedures:   None Antimicrobials:   None   Objective: Vitals:   11/20/15 0300 11/20/15 1425 11/20/15 2130 11/21/15 0513  BP: 102/75 (!) 121/94 128/80 (!) 123/97  Pulse: 88 91 (!) 101 92  Resp: 18 20 20 18   Temp: 98.8 F (37.1 C) 97.8 F (36.6 C) 97.9 F (36.6 C) 98.1 F (36.7 C)  TempSrc: Oral Oral Oral Axillary  SpO2: 100% 100% 100% 99%  Weight:      Height:        Intake/Output Summary (Last 24 hours) at 11/21/15 1043 Last data filed at 11/21/15 0943  Gross per 24 hour  Intake             3000 ml  Output             1525 ml  Net             1475 ml   Filed Weights   11/18/15 2217 11/19/15 0130  Weight: 86.2 kg (190 lb) 87.5 kg (193 lb)    Examination: General exam: Appears comfortable - does not verbalize HEENT: PERRLA, oral mucosa moist, no sclera icterus or thrush Respiratory system: Clear to auscultation. Respiratory effort normal. Cardiovascular system: S1 & S2 heard, RRR.  No murmurs  Gastrointestinal system: Abdomen soft, non-tender, nondistended. Normal bowel  sound. No organomegaly Central nervous system: Alert - unable to tell if he is oriented as he is non-verbal- No focal neurological deficits. Extremities: No cyanosis, clubbing or edema  Data Reviewed: I have personally reviewed following labs and imaging studies  CBC:  Recent Labs Lab 11/18/15 2246 11/20/15 0527 11/20/15 0921  WBC 9.8 6.5 5.8  HGB 20.6* 15.7 16.2  HCT 62.4* 49.2 48.3  MCV 82.1 83.7 82.0  PLT 139* 77* 78*   Basic Metabolic Panel:  Recent Labs Lab 11/18/15 2246 11/19/15 0431 11/20/15 0527  NA 154* 157* 147*  K 4.4 4.4 3.6  CL 114* 117* 113*  CO2 25 27 28   GLUCOSE 158* 125* 134*  BUN 81* 68* 36*  CREATININE 2.51* 1.89* 1.35*  CALCIUM 9.9 8.8* 8.7*   Liver Function  Tests:  Recent Labs Lab 11/18/15 2246 11/19/15 0431 11/20/15 0527  AST 67* 55* 59*  ALT 81* 67* 65*  ALKPHOS 75 63 57  BILITOT 3.1* 3.0* 2.4*  PROT 9.6* 8.0 7.2  ALBUMIN 4.7 3.9 3.5   HbA1C:  Recent Labs  11/19/15 0431  HGBA1C 6.6*   CBG:  Recent Labs Lab 11/19/15 2132 11/20/15 0901 11/20/15 1208 11/20/15 1728 11/20/15 2129  GLUCAP 131* 139* 110* 144* 96   Urine analysis:    Component Value Date/Time   COLORURINE YELLOW 04/28/2012 2133   APPEARANCEUR CLEAR 04/28/2012 2133   LABSPEC 1.025 04/10/2014 1914   PHURINE 5.5 04/10/2014 1914   GLUCOSEU NEGATIVE 04/10/2014 1914   HGBUR NEGATIVE 04/10/2014 1914   BILIRUBINUR NEGATIVE 04/10/2014 1914   KETONESUR NEGATIVE 04/10/2014 1914   PROTEINUR NEGATIVE 04/10/2014 1914   UROBILINOGEN 0.2 04/10/2014 1914   NITRITE NEGATIVE 04/10/2014 1914   LEUKOCYTESUR NEGATIVE 04/10/2014 1914   Radiology Studies: No results found.  Scheduled Meds: . feeding supplement (ENSURE ENLIVE)  237 mL Oral BID BM  . insulin aspart  0-5 Units Subcutaneous QHS  . insulin aspart  0-9 Units Subcutaneous TID WC  . sodium chloride flush  3 mL Intravenous Q12H  . sodium chloride flush  3 mL Intravenous Q12H   Continuous Infusions: . dextrose Stopped (11/21/15 0930)     LOS: 2 days    Time spent in minutes: 35    MAGICK-Aadvik Roker, MD [facility] Pager:[phone] [email or web address] Password [email or web address] 11/21/2015, 10:43 AM

## 2015-11-22 LAB — BASIC METABOLIC PANEL
Anion gap: 6 (ref 5–15)
BUN: 13 mg/dL (ref 6–20)
CALCIUM: 8.3 mg/dL — AB (ref 8.9–10.3)
CO2: 28 mmol/L (ref 22–32)
CREATININE: 0.97 mg/dL (ref 0.61–1.24)
Chloride: 105 mmol/L (ref 101–111)
GFR calc non Af Amer: >60 mL/min (ref >60)
GLUCOSE: 139 mg/dL — AB (ref 65–99)
Potassium: 3 mmol/L — ABNORMAL LOW (ref 3.5–5.1)
Sodium: 139 mmol/L (ref 135–145)

## 2015-11-22 LAB — GLUCOSE, CAPILLARY
Glucose-Capillary: 149 mg/dL — ABNORMAL HIGH (ref 65–99)
Glucose-Capillary: 131 mg/dL — ABNORMAL HIGH (ref 65–99)
Glucose-Capillary: 120 mg/dL — ABNORMAL HIGH (ref 65–99)
Glucose-Capillary: 142 mg/dL — ABNORMAL HIGH (ref 65–99)

## 2015-11-22 LAB — CBC
HEMATOCRIT: 42.9 % (ref 39.0–52.0)
Hemoglobin: 14.1 g/dL (ref 13.0–17.0)
MCH: 26.7 pg (ref 26.0–34.0)
MCHC: 32.9 g/dL (ref 30.0–36.0)
MCV: 81.3 fL (ref 78.0–100.0)
Platelets: 78 10*3/uL — ABNORMAL LOW (ref 150–400)
RBC: 5.28 MIL/uL (ref 4.22–5.81)
RDW: 16 % — AB (ref 11.5–15.5)
WBC: 5.1 10*3/uL (ref 4.0–10.5)

## 2015-11-22 LAB — TSH: TSH: 3.118 u[IU]/mL (ref 0.350–4.500)

## 2015-11-22 MED ORDER — POTASSIUM CHLORIDE 10 MEQ/100ML IV SOLN
10.0000 meq | INTRAVENOUS | Status: AC
  Administered 2015-11-22 (×4): 10 meq via INTRAVENOUS
  Filled 2015-11-22 (×4): qty 100

## 2015-11-22 MED ORDER — POTASSIUM CHLORIDE CRYS ER 20 MEQ PO TBCR
40.0000 meq | EXTENDED_RELEASE_TABLET | Freq: Once | ORAL | Status: DC
Start: 2015-11-22 — End: 2015-11-24
  Filled 2015-11-22 (×2): qty 2

## 2015-11-22 NOTE — Discharge Instructions (Signed)
Schizophrenia °Schizophrenia is a mental illness. It may cause disturbed or disorganized thinking, speech, or behavior. People with schizophrenia have problems functioning in one or more areas of life: work, school, home, or relationships. People with schizophrenia are at increased risk for suicide, certain chronic physical illnesses, and unhealthy behaviors, such as smoking and drug use. °People who have family members with schizophrenia are at higher risk of developing the illness. Schizophrenia affects men and women equally but usually appears at an earlier age (teenage or early adult years) in men.  °SYMPTOMS °The earliest symptoms are often subtle (prodrome) and may go unnoticed until the illness becomes more severe (first-break psychosis). Symptoms of schizophrenia may be continuous or may come and go in severity. Episodes often are triggered by major life events, such as family stress, college, military service, marriage, pregnancy or child birth, divorce, or loss of a loved one. People with schizophrenia may see, hear, or feel things that do not exist (hallucinations). They may have false beliefs in spite of obvious proof to the contrary (delusions). Sometimes speech is incoherent or behavior is odd or withdrawn.  °DIAGNOSIS °Schizophrenia is diagnosed through an assessment by your caregiver. Your caregiver will ask questions about your thoughts, behavior, mood, and ability to function in daily life. Your caregiver may ask questions about your medical history and use of alcohol or drugs, including prescription medication. Your caregiver may also order blood tests and imaging exams. Certain medical conditions and substances can cause symptoms that resemble schizophrenia. Your caregiver may refer you to a mental health specialist for evaluation. There are three major criterion for a diagnosis of schizophrenia: °· Two or more of the following five symptoms are present for a month or longer: °¨ Delusions. Often  the delusions are that you are being attacked, harassed, cheated, persecuted or conspired against (persecutory delusions). °¨ Hallucinations.   °¨ Disorganized speech that does not make sense to others. °¨ Grossly disorganized (confused or unfocused) behavior or extremely overactive or underactive motor activity (catatonia). °¨ Negative symptoms such as bland or blunted emotions (flat affect), loss of will power (avolition), and withdrawal from social contacts (social isolation). °· Level of functioning in one or more major areas of life (work, school, relationships, or self-care) is markedly below the level of functioning before the onset of illness.   °· There are continuous signs of illness (either mild symptoms or decreased level of functioning) for at least 6 months or longer. °TREATMENT  °Schizophrenia is a long-term illness. It is best controlled with continuous treatment rather than treatment only when symptoms occur. The following treatments are used to manage schizophrenia: °· Medication--Medication is the most effective and important form of treatment for schizophrenia. Antipsychotic medications are usually prescribed to help manage schizophrenia. Other types of medication may be added to relieve any symptoms that may occur despite the use of antipsychotic medications. °· Counseling or talk therapy--Individual, group, or family counseling may be helpful in providing education, support, and guidance. Many people with schizophrenia also benefit from social skills and job skills (vocational) training. °A combination of medication and counseling is best for managing the disorder over time. A procedure in which electricity is applied to the brain through the scalp (electroconvulsive therapy) may be used to treat catatonic schizophrenia or schizophrenia in people who cannot take or do not respond to medication and counseling. °  °This information is not intended to replace advice given to you by your health  care provider. Make sure you discuss any questions you have with   your health care provider. °  °Document Released: 02/22/2000 Document Revised: 03/17/2014 Document Reviewed: 05/19/2012 °Elsevier Interactive Patient Education ©2016 Elsevier Inc. ° °

## 2015-11-22 NOTE — Discharge Summary (Signed)
Physician Discharge Summary  Stanley Watkins [id number] DOB: Aug 08, 1950 DOA: 11/18/2015  PCP: Dorrene GermanEdwin A Avbuere, MD  Admit date: 11/18/2015 Discharge date: 11/22/2015  Recommendations for Outpatient Follow-up:  1. Pt will be discharged to inpatient psych for further evaluation and management   Discharge Diagnoses:  Principal Problem:   AKI (acute kidney injury) (HCC) Active Problems:   Schizoaffective disorder (HCC)  Discharge Condition: Stable  Diet recommendation: Heart healthy diet discussed in details   Brief Narrative:  65 y.o.malewith medical history significant of dementia, type 2 diabetes, hypertension, major depression, schizoaffective disorderwho was brought by EMS his wife to the emergency department due to progressively worse depression for the past few months. Per patient's wife, for the past few months the patient has been isolating himself, secluded, sad, with very poor oral intake and a weight loss of 20 pounds. He has not taking a shower in some time. He has not been compliant with his medications. He had a similar picture in 2016 which required admission to the psychiatry service where he received ECT and medications.   Assessment & Plan:   Principal Problem:   AKI (acute kidney injury) due to bladder outlet obstruction and poor oral intake  - baseline Cr 1.0 - 9/11 had not urinated since being admitted- bladder scan showed > 600 cc of urine- placed foley cath - Cr overall improving and pt currently appears euvolemic - Cr is now WNL    Active Problems: Urinary retention - BPH? Foley in place  - Flomax started but pt has refused to take   Hypernatremia - changed 1/2 NS to D5W - Na trending down and is WNL this AM   Sinus tachycardia  - likely reactive from dehydration - HR within target range this AM    Schizoaffective disorder /  Depression - has not been taking home meds- no meds on home meds list - psych consulted, recommend inpatient  psych, plan to discharge today   Polycythemia - likely hemoconcentration - has improved with hydration  Hypokalemia - supplemented piror to discharge   Thrombocytopenia - cause uncertain- lab work repeated to confirm finding - have held Lovenox-send HITT panel as he has had a 50 % drop in platelets - no signs of active bleeding  Type 2 diabetes mellitus, A1C 6.6 - placed on low dose sliding scale while inpatient  - would think diet control will be sufficient until pt able to take PO   DVT prophylaxis: SCD's while inpatient  Code Status: Full code Family Communication: no family at bedside  Disposition Plan: needs inpatient psych placement, bed available today and plan to discharge   Consultants:   Psych  Procedures:   None Antimicrobials:   None   Discharge Exam: Vitals:   11/22/15 0514 11/22/15 0913  BP: 107/78 115/80  Pulse: 93 89  Resp: 18   Temp: 98.4 F (36.9 C) 97.2 F (36.2 C)   Vitals:   11/21/15 0513 11/21/15 1451 11/22/15 0514 11/22/15 0913  BP: (!) 123/97 129/71 107/78 115/80  Pulse: 92 89 93 89  Resp: 18 16 18    Temp: 98.1 F (36.7 C) 98 F (36.7 C) 98.4 F (36.9 C) 97.2 F (36.2 C)  TempSrc: Axillary Axillary Axillary Axillary  SpO2: 99% 100% 98%   Weight:      Height:       General: Pt is sleeping, refusing to cooperate with exam, rest of the exam cancelled   Discharge Instructions  Discharge Instructions    Diet - low  sodium heart healthy    Complete by:  As directed    Increase activity slowly    Complete by:  As directed        Medication List    You have not been prescribed any medications.    Follow-up Information    Dorrene German, MD .   Specialty:  Internal Medicine Contact information: [address] [place] [address] [phone]            The results of significant diagnostics from this hospitalization (including imaging, microbiology, ancillary and laboratory) are listed below for  reference.     Microbiology: No results found for this or any previous visit (from the past 240 hour(s)).   Labs: Basic Metabolic Panel:  Recent Labs Lab 11/18/15 2246 11/19/15 0431 11/20/15 0527 11/22/15 0507  NA 154* 157* 147* 139  K 4.4 4.4 3.6 3.0*  CL 114* 117* 113* 105  CO2 25 27 28 28   GLUCOSE 158* 125* 134* 139*  BUN 81* 68* 36* 13  CREATININE 2.51* 1.89* 1.35* 0.97  CALCIUM 9.9 8.8* 8.7* 8.3*   Liver Function Tests:  Recent Labs Lab 11/18/15 2246 11/19/15 0431 11/20/15 0527  AST 67* 55* 59*  ALT 81* 67* 65*  ALKPHOS 75 63 57  BILITOT 3.1* 3.0* 2.4*  PROT 9.6* 8.0 7.2  ALBUMIN 4.7 3.9 3.5   CBC:  Recent Labs Lab 11/18/15 2246 11/20/15 0527 11/20/15 0921 11/22/15 0507  WBC 9.8 6.5 5.8 5.1  HGB 20.6* 15.7 16.2 14.1  HCT 62.4* 49.2 48.3 42.9  MCV 82.1 83.7 82.0 81.3  PLT 139* 77* 78* 78*   CBG:  Recent Labs Lab 11/20/15 2129 11/21/15 1224 11/21/15 1700 11/22/15 0836 11/22/15 1213  GLUCAP 96 122* 135* 149* 131*    SIGNED: Time coordinating discharge: 30 minutes  MAGICK-Nyx Keady, MD  [facility] 11/22/2015, 1:25 PM Pager [phone]  If 7PM-7AM, please contact night-coverage [email or web address] Password [email or web address]

## 2015-11-22 NOTE — Progress Notes (Signed)
PT Cancellation Note  Patient Details Name: Stanley SkillernBennett A Veltri MRN: [id number] DOB: Jul 07, 1950   Cancelled Treatment:    Reason Eval/Treat Not Completed: PT screened, no needs identified, will sign off Per RN, pt has been refusing care.  Pt to d/c home today.  PT to sign off.   Catina Nuss,KATHrine E 11/22/2015, 11:39 AM Zenovia JarredKati Adaria Hole, PT, DPT 11/22/2015 Pager: [phone]

## 2015-11-22 NOTE — Care Management Note (Signed)
Case Management Note  Patient Details  Name: Nani SkillernBennett A Regnier MRN: [id number] Date of Birth: 18-Nov-1950  Subjective/Objective:                    Action/Plan:d/c IP Psych.   Expected Discharge Date:   (unknown)               Expected Discharge Plan:  [facility]  In-House Referral:  Clinical Social Work  Discharge planning Services  CM Consult  Post Acute Care Choice:    Choice offered to:     DME Arranged:    DME Agency:     HH Arranged:    HH Agency:     Status of Service:  Completed, signed off  If discussed at [employer] Meetings, dates discussed:    Additional Comments:  Lanier ClamMahabir, Danel Studzinski, RN 11/22/2015, 1:48 PM

## 2015-11-22 NOTE — Progress Notes (Addendum)
[facility] inpatient is requesting TSH and EKG for patient admission. Informed MD.-Myers. LCSWA informed RN to call once TSH and EKG are complete.  Met with patient spouse at bedside and explained patient disposition. Pt. Spouse reports she understands and asked to be called before patient is transported to [facility].

## 2015-11-22 NOTE — Progress Notes (Signed)
Pt will not follow commands to complete a full physical assessment. Collected vitals and checked bilateral radial pulses.

## 2015-11-22 NOTE — Progress Notes (Signed)
Patient refusing food and oral medications. Dr. Izola PriceMyers notified of refusal of PO Potassium pills.

## 2015-11-22 NOTE — Progress Notes (Addendum)
Update: [facility] at this time due to low potassium. RN informed. LCSWA informed Dr. Izola PriceMyers via Loretha StaplerAmion.  Dr. Izola PriceMyers aware of patient status.

## 2015-11-22 NOTE — Progress Notes (Signed)
Taking over care of Patient, agree with previous RN assessment. Patient resting comfortably at this time. Will continue to monitor.

## 2015-11-22 NOTE — Progress Notes (Signed)
Stanley Watkins from social work called and and informed of EKG being done and TSH resulted as requested.

## 2015-11-23 LAB — BASIC METABOLIC PANEL
ANION GAP: 7 (ref 5–15)
Anion gap: 4 — ABNORMAL LOW (ref 5–15)
BUN: 10 mg/dL (ref 6–20)
BUN: 9 mg/dL (ref 6–20)
CALCIUM: 8.5 mg/dL — AB (ref 8.9–10.3)
CALCIUM: 8.5 mg/dL — AB (ref 8.9–10.3)
CO2: 28 mmol/L (ref 22–32)
CO2: 29 mmol/L (ref 22–32)
CREATININE: 1.13 mg/dL (ref 0.61–1.24)
CREATININE: 0.97 mg/dL (ref 0.61–1.24)
Chloride: 101 mmol/L (ref 101–111)
Chloride: 104 mmol/L (ref 101–111)
GFR calc non Af Amer: >60 mL/min (ref >60)
GFR calc non Af Amer: >60 mL/min (ref >60)
Glucose, Bld: 139 mg/dL — ABNORMAL HIGH (ref 65–99)
Glucose, Bld: 119 mg/dL — ABNORMAL HIGH (ref 65–99)
Potassium: 3 mmol/L — ABNORMAL LOW (ref 3.5–5.1)
Potassium: 3.5 mmol/L (ref 3.5–5.1)
SODIUM: 136 mmol/L (ref 135–145)
SODIUM: 137 mmol/L (ref 135–145)

## 2015-11-23 LAB — CBC
HCT: 41.9 % (ref 39.0–52.0)
HEMOGLOBIN: 14.1 g/dL (ref 13.0–17.0)
MCH: 26.5 pg (ref 26.0–34.0)
MCHC: 33.7 g/dL (ref 30.0–36.0)
MCV: 78.6 fL (ref 78.0–100.0)
Platelets: 77 10*3/uL — ABNORMAL LOW (ref 150–400)
RBC: 5.33 MIL/uL (ref 4.22–5.81)
RDW: 15.4 % (ref 11.5–15.5)
WBC: 4.7 10*3/uL (ref 4.0–10.5)

## 2015-11-23 LAB — MAGNESIUM: MAGNESIUM: 1.9 mg/dL (ref 1.7–2.4)

## 2015-11-23 LAB — GLUCOSE, CAPILLARY
GLUCOSE-CAPILLARY: 144 mg/dL — AB (ref 65–99)
GLUCOSE-CAPILLARY: 128 mg/dL — AB (ref 65–99)
Glucose-Capillary: 119 mg/dL — ABNORMAL HIGH (ref 65–99)

## 2015-11-23 MED ORDER — BOOST / RESOURCE BREEZE PO LIQD
1.0000 | ORAL | Status: DC
  Administered 2015-11-23: 1 via ORAL

## 2015-11-23 MED ORDER — POTASSIUM CHLORIDE 10 MEQ/100ML IV SOLN
10.0000 meq | INTRAVENOUS | Status: AC
  Administered 2015-11-23 (×6): 10 meq via INTRAVENOUS
  Filled 2015-11-23 (×2): qty 100

## 2015-11-23 NOTE — Care Management Important Message (Signed)
Important Message  Patient Details  Name: Stanley Watkins MRN: [id number] Date of Birth: 06-Aug-1950   Medicare Important Message Given:  Yes    Haskell FlirtJamison, Paeton Studer 11/23/2015, 10:05 AMImportant Message  Patient Details  Name: Stanley Watkins MRN: [id number] Date of Birth: 06-Aug-1950   Medicare Important Message Given:  Yes    Haskell FlirtJamison, Shaughnessy Gethers 11/23/2015, 10:05 AM

## 2015-11-23 NOTE — Progress Notes (Signed)
Nutrition Follow-up  DOCUMENTATION CODES:   Severe malnutrition in context of acute illness/injury  INTERVENTION:  - Will d/c Ensure Enlive and order trial of Boost Breeze once/day, this supplement provides 250 kcal and 9 grams of protein.  - Continue to encourage PO intake of meals and supplement. - RD will continue to monitor for needs if pt does not d/c today.  NUTRITION DIAGNOSIS:   Inadequate oral intake related to acute illness as evidenced by per patient/family report. -ongoing with meal refusal.  GOAL:   Patient will meet greater than or equal to 90% of their needs -unmet  MONITOR:   PO intake, Supplement acceptance, Weight trends, Labs, I & O's  ASSESSMENT:   65 y.o. male with medical history significant of dementia,  type 2 diabetes, hypertension, major depression, schizoaffective disorder who was brought by EMS his wife to the emergency department due to progressively worse depression for the past few months. Per patient's wife, for the past few months the patient has been isolating himself, secluded, sad, with very poor oral intake and a weight loss of 20 pounds. He has not taking a shower in some time. He has not been compliant with his medications. He had a similar picture in 2016 which require admission to the psychiatry service where he received ECT and medications  9/15 Per chart review, pt has been refusing all meals since 9/13; on 9/12 he consumed 25% of lunch and 50% of dinner. Pt continues to be nonverbal and refuse care from staff. Per MD note, pt is a possible d/c today after K supplementation if K remains stable.   Ensure Shiela Mayer has already been ordered BID. Will change to Boost Breeze once/day (trial) and monitor for acceptance if pt unable to d/c.   Medications reviewed; sliding scale Novolog, 10 mEq IV KCl x6 runs today, 10 mEq IV KCl x4 runs yesterday, 40 mEq oral KCl x1 dose yesterday. Labs reviewed; CBGs: 128 and 144 mg/dL today, K: 3 mmol/L, Ca: 8.5  mg/dL. IVF: D5 @ 125 mL/hr (510 kcal).     9/11 - No intakes documented since admission. No family/visitors present.  - Spoke with sitter who states pt did not consume anything for breakfast and shortly before RD visit pt had eaten ~50% of a roll and mashed potatoes.  - Tech denies pt having any difficulty swallowing these items.  - She states that pt's wife is coming later today and plan to work with her to order items pt likes for dinner.  - Pt in 4 point soft restraints; defer physical assessment at this time. -  Per wife's report per notes, pt has lost 20 lbs in the past few months.  - Based on CBW, this indicates 9.4% body weight loss which is likely significant for time frame.  - No weight in pt's chart since July 2016.  - Pt meets criteria for malnutrition based on weight loss and likely <50% intakes for >/=5 days based on information available in the chart. - Once PO intakes improve and/or pt communicating with staff, will order oral nutrition supplements.  - May need to monitor for refeeding if PO intakes improve quickly.   IVF: D5 @ 125 mL/hr (510 kcal).    Diet Order:  Diet regular Room service appropriate? Yes; Fluid consistency: Thin Diet - low sodium heart healthy  Skin:  Reviewed, no issues  Last BM:  9/12  Height:   Ht Readings from Last 1 Encounters:  11/19/15 6' (1.829 m)    Weight:  Wt Readings from Last 1 Encounters:  11/19/15 193 lb (87.5 kg)    Ideal Body Weight:  80.91 kg  BMI:  Body mass index is 26.18 kg/m.  Estimated Nutritional Needs:   Kcal:  1700-1900  Protein:  75-85 grams  Fluid:  2 L/day  EDUCATION NEEDS:   No education needs identified at this time    Trenton GammonJessica Rhianon Zabawa, MS, RD, LDN Inpatient Clinical Dietitian Pager # [phone] After hours/weekend pager # [phone]

## 2015-11-23 NOTE — Progress Notes (Signed)
Pt remains guarded. Will not allow staff to assist in care. Sitter at bedside

## 2015-11-23 NOTE — Progress Notes (Addendum)
[facility] facility needs copy of potassium levels once IV treatment complete. RNCM-Kathy informed.   LCSWA confirmed [facility]. Requested IVC paperwork faxed.

## 2015-11-23 NOTE — Progress Notes (Signed)
Patient remains nonverbal. He is resting. Sitter is at bedside.

## 2015-11-23 NOTE — Plan of Care (Signed)
Problem: Safety: Goal: Ability to remain free from injury will improve Outcome: Completed/Met Date Met: 11/23/15 Sitter remains at bedside.

## 2015-11-23 NOTE — Progress Notes (Signed)
Corporate treasureritter and RN stood at US Airwaysdoorway of pt room. Tray at bedside. Pt began to eat and when RN/sitter returned inside room pt stopped eating. Pt remains non verbal. Attempted to set pt up in bed. Pt pulled covers over head. When stepping back to doorway, pt witnessed eating.

## 2015-11-23 NOTE — Progress Notes (Signed)
K still low, will supplement and repeat BMP in afternoon. Can be discharged today if K is stable.   Debbora PrestoMAGICK-Gwendolyn Nishi, MD  [facility] Pager [phone]  If 7PM-7AM, please contact night-coverage [email or web address] Password [email or web address]

## 2015-11-23 NOTE — Progress Notes (Signed)
Report given by ongoing nurse. Agreed with nurse assessment of patient and will cont to monitor.  

## 2015-11-24 LAB — GLUCOSE, CAPILLARY: Glucose-Capillary: 106 mg/dL — ABNORMAL HIGH (ref 65–99)

## 2015-11-24 NOTE — Progress Notes (Addendum)
Patient will be served IVC by [employer]. LCSWA informed patient wife about process-pt. Will be served and then transported by [employer] with [employer] following behind [facility]. Patient wife reports [facility]

## 2015-11-24 NOTE — Progress Notes (Signed)
IV removed by patient. NP on call made aware of possible discharge tomorrow. Stated  to  leave out for now and to reinstart  in the morning if needed.

## 2015-11-24 NOTE — Discharge Summary (Signed)
Physician Discharge Summary  Stanley Watkins WUJ:[id number] DOB: 25-Oct-1950 DOA: 11/18/2015  PCP: Dorrene GermanEdwin A Avbuere, MD  Admit date: 11/18/2015 Discharge date: 11/24/2015  Recommendations for Outpatient Follow-up:  1. Pt will be discharged to inpatient psych for further evaluation and management   Discharge Diagnoses:  Principal Problem:   AKI (acute kidney injury) (HCC) Active Problems:   Schizoaffective disorder (HCC)  Discharge Condition: Stable  Diet recommendation: Heart healthy diet discussed in details   Brief Narrative:  65 y.o.malewith medical history significant of dementia, type 2 diabetes, hypertension, major depression, schizoaffective disorderwho was brought by EMS his wife to the emergency department due to progressively worse depression for the past few months. Per patient's wife, for the past few months the patient has been isolating himself, secluded, sad, with very poor oral intake and a weight loss of 20 pounds. He has not taking a shower in some time. He has not been compliant with his medications. He had a similar picture in 2016 which required admission to the psychiatry service where he received ECT and medications.   Assessment & Plan:   Principal Problem:   AKI (acute kidney injury) due to bladder outlet obstruction and poor oral intake  - baseline Cr 1.0 - 9/11 had not urinated since being admitted- bladder scan showed > 600 cc of urine- placed foley cath - Cr overall improving and pt currently appears euvolemic - Cr is now WNL   Active Problems: Urinary retention - BPH? Foley in place  - Flomax started but pt has refused to take   Hypernatremia - changed 1/2 NS to D5W - Na trending down and is WNL this AM  Sinus tachycardia  - likely reactive from dehydration - HR within target range this AM    Schizoaffective disorder /  Depression - has not been taking home meds- no meds on home meds list - psych consulted, recommend inpatient  psych, plan to discharge today   Polycythemia - likely hemoconcentration - has improved with hydration  Hypokalemia - supplemented and WNL this AM  Thrombocytopenia - cause uncertain- lab work repeated to confirm finding - have held Lovenox-send HITT panel as he has had a 50 % drop in platelets - no signs of active bleeding  Type 2 diabetes mellitus, A1C 6.6 - placed on low dose sliding scale while inpatient  - would think diet control will be sufficient until pt able to take PO   DVT prophylaxis: SCD's while inpatient  Code Status: Full code Family Communication: no family at bedside  Disposition Plan: inpatient psych   Consultants:   Psych  Procedures:   None Antimicrobials:   None   Discharge Exam: Vitals:   11/24/15 0629 11/24/15 0724  BP:  118/80  Pulse:  97  Resp: 19 17  Temp:     Vitals:   11/23/15 1400 11/23/15 1501 11/24/15 0629 11/24/15 0724  BP: (!) 159/138 114/80  118/80  Pulse:    97  Resp: 20 20 19 17   Temp:  98.8 F (37.1 C)    TempSrc:  Axillary    SpO2:    96%  Weight:      Height:       General: Pt is sleeping, refusing to cooperate with exam, rest of the exam cancelled   Discharge Instructions  Discharge Instructions    Diet - low sodium heart healthy    Complete by:  As directed    Diet - low sodium heart healthy    Complete by:  As directed    Increase activity slowly    Complete by:  As directed    Increase activity slowly    Complete by:  As directed        Medication List    You have not been prescribed any medications.    Follow-up Information    Dorrene German, MD .   Specialty:  Internal Medicine Contact information: [address] [place] [address] [phone]            The results of significant diagnostics from this hospitalization (including imaging, microbiology, ancillary and laboratory) are listed below for reference.     Microbiology: No results found for this or any previous  visit (from the past 240 hour(s)).   Labs: Basic Metabolic Panel:  Recent Labs Lab 11/19/15 0431 11/20/15 0527 11/22/15 0507 11/23/15 0518 11/23/15 1554  NA 157* 147* 139 136 137  K 4.4 3.6 3.0* 3.0* 3.5  CL 117* 113* 105 101 104  CO2 27 28 28 28 29   GLUCOSE 125* 134* 139* 139* 119*  BUN 68* 36* 13 10 9   CREATININE 1.89* 1.35* 0.97 1.13 0.97  CALCIUM 8.8* 8.7* 8.3* 8.5* 8.5*  MG  --   --   --  1.9  --    Liver Function Tests:  Recent Labs Lab 11/18/15 2246 11/19/15 0431 11/20/15 0527  AST 67* 55* 59*  ALT 81* 67* 65*  ALKPHOS 75 63 57  BILITOT 3.1* 3.0* 2.4*  PROT 9.6* 8.0 7.2  ALBUMIN 4.7 3.9 3.5   CBC:  Recent Labs Lab 11/18/15 2246 11/20/15 0527 11/20/15 0921 11/22/15 0507 11/23/15 0518  WBC 9.8 6.5 5.8 5.1 4.7  HGB 20.6* 15.7 16.2 14.1 14.1  HCT 62.4* 49.2 48.3 42.9 41.9  MCV 82.1 83.7 82.0 81.3 78.6  PLT 139* 77* 78* 78* 77*   CBG:  Recent Labs Lab 11/22/15 2138 11/23/15 0726 11/23/15 1133 11/23/15 1557 11/24/15 0735  GLUCAP 142* 144* 128* 119* 106*    SIGNED: Time coordinating discharge: 30 minutes  MAGICK-Patrisia Faeth, MD  [facility] 11/24/2015, 10:23 AM Pager [phone]  If 7PM-7AM, please contact night-coverage [email or web address] Password [email or web address]

## 2015-11-24 NOTE — Progress Notes (Addendum)
IVC Paper Served by Phineas Incheseputy Harris. Copy placed on chart, others placed in transport packet.  Med. Nes. Form completed.  Officer Almonor +1 and [facility] will transport patient to [facility]. Grace-[facility] aware.  Transport packet placed. RN-Amber informed.   Vivi BarrackNicole Linzie Criss, Theresia MajorsLCSWA, MSW Clinical Social Worker 5E and Psychiatric Service Line [phone] 11/24/2015  6:24 PM

## 2015-11-24 NOTE — Progress Notes (Signed)
LCSWA faxed facility updated clinicals for 9/15.

## 2015-11-24 NOTE — Care Management Note (Signed)
Case Management Note  Patient Details  Name: Nani SkillernBennett A Tucker MRN: [id number] Date of Birth: 1950/08/28  Subjective/Objective:   AKI, schizoaffective disorder                Action/Plan: Discharge Planning: AVS reviewed:   Chart reviewed. [facility].   Expected Discharge Date:  11/24/2015            Expected Discharge Plan:  [facility]iatric Hospital  In-House Referral:  Clinical Social Work  Discharge planning Services  CM Consult  Post Acute Care Choice:  NA Choice offered to:  NA  DME Arranged:  N/A DME Agency:  NA  HH Arranged:  NA HH Agency:  NA  Status of Service:  Completed, signed off  If discussed at [facility] Length of Stay Meetings, dates discussed:    Additional Comments:  Elliot CousinShavis, Shalice Woodring Ellen, RN 11/24/2015, 4:41 PM

## 2015-11-25 NOTE — Progress Notes (Signed)
[facility] contacted regarding report.Patient has urinary retention, will be discharged with foley catheter.

## 2016-09-30 IMAGING — CR DG CHEST 1V
1 series · 1 of 1 positions shown · non-contrast
Comparison: February 11, 2014

CLINICAL DATA: Diabetes mellitus. Hypertension. Pre-admission
testing

EXAM:
CHEST  1 VIEW

[AP]
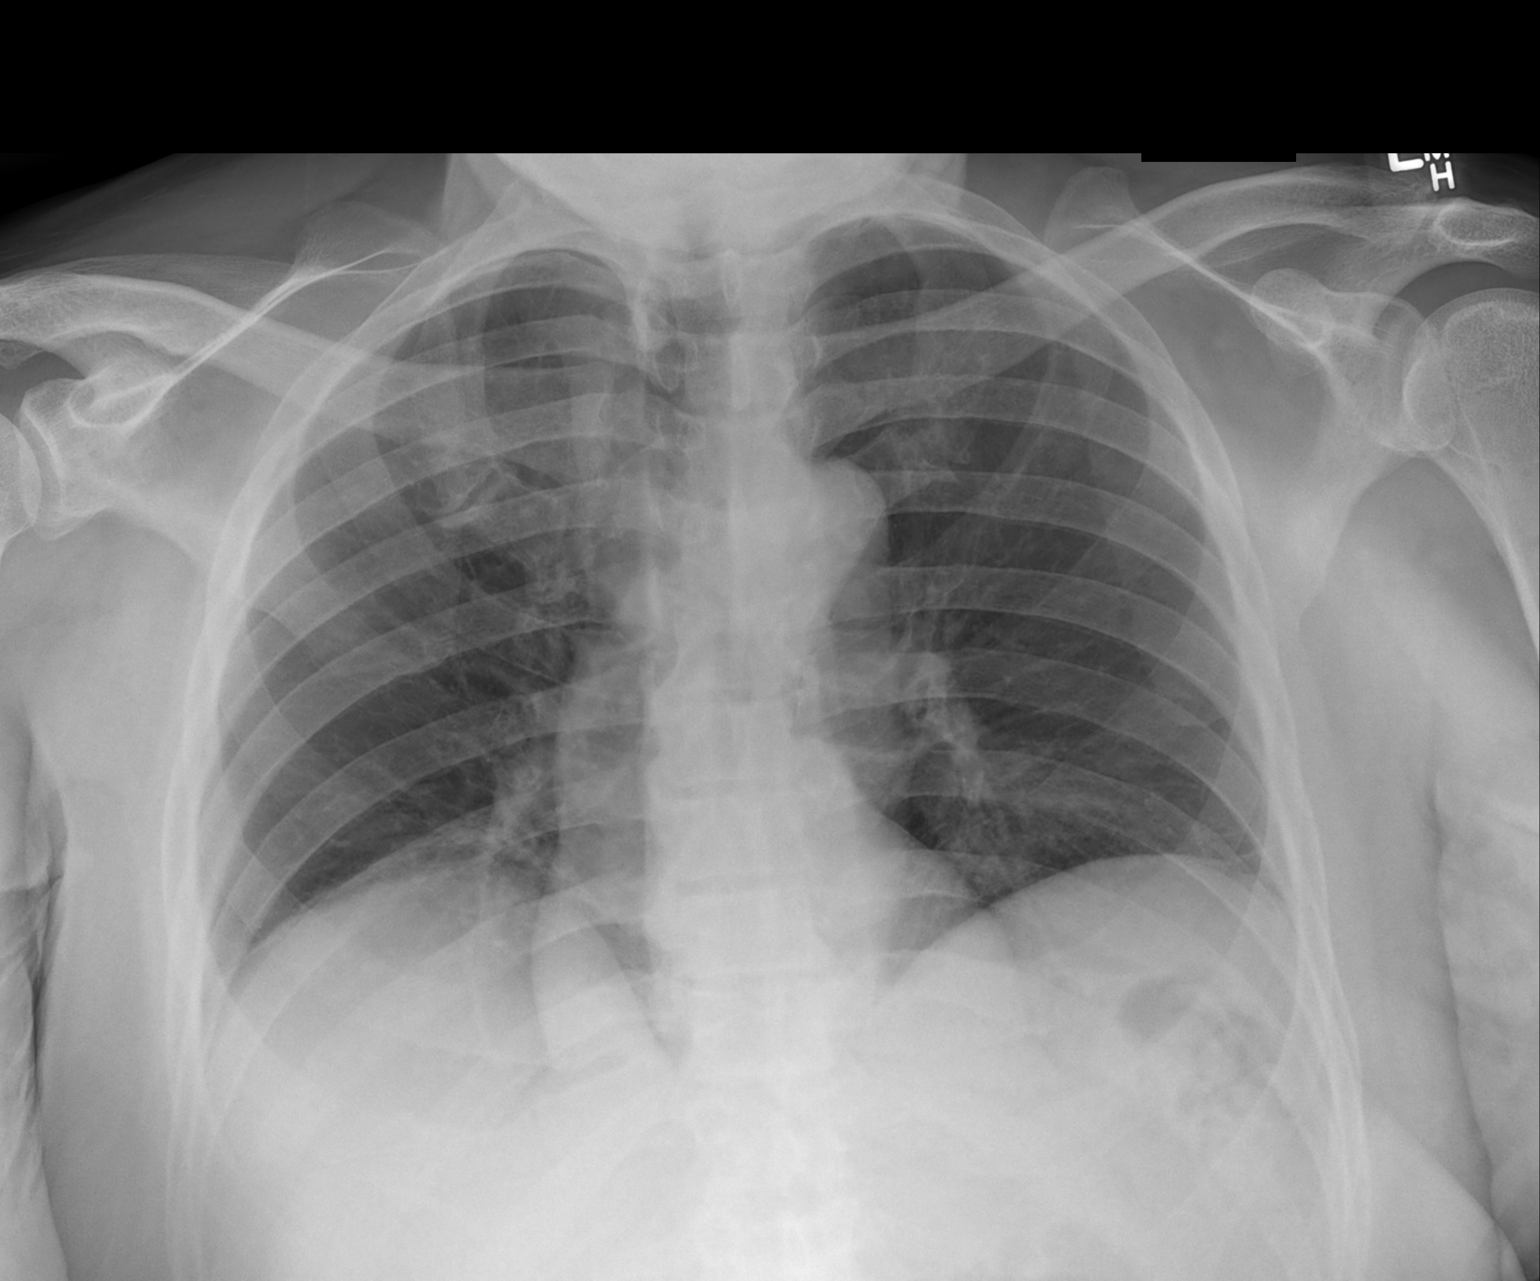

[1 of 1 positions shown; findings below may reference images not displayed]

FINDINGS: There is slight bibasilar scarring. There is no edema or
consolidation. The heart size and pulmonary vascularity are normal.
No adenopathy. No bone lesions.
IMPRESSION: Slight bibasilar scarring.  No edema or consolidation.

## 2019-05-01 ENCOUNTER — Ambulatory Visit: Payer: Medicare Other | Attending: Internal Medicine

## 2019-05-01 DIAGNOSIS — Z23 Encounter for immunization: Secondary | ICD-10-CM

## 2019-05-01 NOTE — Progress Notes (Signed)
   Covid-19 Vaccination Clinic  Name:  Stanley Watkins    MRN: [id number] DOB: 1950-04-29  05/01/2019  Stanley Watkins was observed post Covid-19 immunization for 15 minutes without incidence. He was provided with Vaccine Information Sheet and instruction to access the V-Safe system.   Stanley Watkins was instructed to call 911 with any severe reactions post vaccine: Marland Kitchen Difficulty breathing  . Swelling of your face and throat  . A fast heartbeat  . A bad rash all over your body  . Dizziness and weakness    Immunizations Administered    Name Date Dose VIS Date Route   Pfizer COVID-19 Vaccine 05/01/2019  1:03 PM 0.3 mL 02/18/2019 Intramuscular   Manufacturer: [employer], [employer]   Lot: [id number]   NDC: [address]-5

## 2019-05-24 ENCOUNTER — Ambulatory Visit: Payer: Medicare Other | Attending: Internal Medicine

## 2019-05-24 DIAGNOSIS — Z23 Encounter for immunization: Secondary | ICD-10-CM

## 2019-05-24 NOTE — Progress Notes (Signed)
   Covid-19 Vaccination Clinic  Name:  Stanley Watkins    MRN: [id number] DOB: 14-Apr-1950  05/24/2019  [name] was observed post Covid-19 immunization for 15 minutes without incident. He was provided with Vaccine Information Sheet and instruction to access the V-Safe system.   [name] was instructed to call 911 with any severe reactions post vaccine: Marland Kitchen Difficulty breathing  . Swelling of face and throat  . A fast heartbeat  . A bad rash all over body  . Dizziness and weakness   Immunizations Administered    Name Date Dose VIS Date Route   Pfizer COVID-19 Vaccine 05/24/2019 10:27 AM 0.3 mL 02/18/2019 Intramuscular   Manufacturer: [employer], [employer]   Lot: [id number]   NDC: [address]-7

## 2020-03-27 ENCOUNTER — Other Ambulatory Visit: Payer: Self-pay | Admitting: Internal Medicine

## 2020-03-28 LAB — LIPID PANEL
Cholesterol: 174 mg/dL (ref <200)
HDL: 33 mg/dL — ABNORMAL LOW (ref >=40)
LDL Cholesterol (Calc): 110 mg/dL (calc) — ABNORMAL HIGH
Non-HDL Cholesterol (Calc): 141 mg/dL (calc) — ABNORMAL HIGH (ref <130)
Total CHOL/HDL Ratio: 5.3 (calc) — ABNORMAL HIGH (ref <5.0)
Triglycerides: 195 mg/dL — ABNORMAL HIGH (ref <150)

## 2020-03-28 LAB — PSA: PSA: 1.57 ng/mL (ref <=4.0)

## 2020-03-28 LAB — COMPLETE METABOLIC PANEL WITH GFR
AG Ratio: 1.1 (calc) (ref 1.0–2.5)
ALT: 21 U/L (ref 9–46)
AST: 16 U/L (ref 10–35)
Albumin: 4 g/dL (ref 3.6–5.1)
Alkaline phosphatase (APISO): 64 U/L (ref 35–144)
BUN: 16 mg/dL (ref 7–25)
CO2: 28 mmol/L (ref 20–32)
Calcium: 9.5 mg/dL (ref 8.6–10.3)
Chloride: 104 mmol/L (ref 98–110)
Creat: 1.22 mg/dL (ref 0.70–1.25)
GFR, Est African American: 70 mL/min/{1.73_m2} (ref >=60)
GFR, Est Non African American: 61 mL/min/{1.73_m2} (ref >=60)
Globulin: 3.5 g/dL (calc) (ref 1.9–3.7)
Glucose, Bld: 94 mg/dL (ref 65–99)
Potassium: 4.4 mmol/L (ref 3.5–5.3)
Sodium: 139 mmol/L (ref 135–146)
Total Bilirubin: 0.3 mg/dL (ref 0.2–1.2)
Total Protein: 7.5 g/dL (ref 6.1–8.1)

## 2020-03-28 LAB — CBC
HCT: 43.3 % (ref 38.5–50.0)
Hemoglobin: 14 g/dL (ref 13.2–17.1)
MCH: 26.9 pg — ABNORMAL LOW (ref 27.0–33.0)
MCHC: 32.3 g/dL (ref 32.0–36.0)
MCV: 83.3 fL (ref 80.0–100.0)
MPV: 13.6 fL — ABNORMAL HIGH (ref 7.5–12.5)
Platelets: 162 10*3/uL (ref 140–400)
RBC: 5.2 10*6/uL (ref 4.20–5.80)
RDW: 15.2 % — ABNORMAL HIGH (ref 11.0–15.0)
WBC: 6.2 10*3/uL (ref 3.8–10.8)

## 2020-03-28 LAB — TSH: TSH: 3.45 mIU/L (ref 0.40–4.50)

## 2020-03-28 LAB — VITAMIN D 25 HYDROXY (VIT D DEFICIENCY, FRACTURES): Vit D, 25-Hydroxy: 12 ng/mL — ABNORMAL LOW (ref 30–100)

## 2022-04-02 ENCOUNTER — Other Ambulatory Visit: Payer: Self-pay | Admitting: Internal Medicine

## 2022-04-03 LAB — COMPLETE METABOLIC PANEL WITH GFR
AG Ratio: 1.2 (calc) (ref 1.0–2.5)
ALT: 24 U/L (ref 9–46)
AST: 16 U/L (ref 10–35)
Albumin: 4.2 g/dL (ref 3.6–5.1)
Alkaline phosphatase (APISO): 70 U/L (ref 35–144)
BUN: 13 mg/dL (ref 7–25)
CO2: 28 mmol/L (ref 20–32)
Calcium: 9.7 mg/dL (ref 8.6–10.3)
Chloride: 103 mmol/L (ref 98–110)
Creat: 1.04 mg/dL (ref 0.70–1.28)
Globulin: 3.6 g/dL (calc) (ref 1.9–3.7)
Glucose, Bld: 89 mg/dL (ref 65–99)
Potassium: 4.6 mmol/L (ref 3.5–5.3)
Sodium: 139 mmol/L (ref 135–146)
Total Bilirubin: 0.3 mg/dL (ref 0.2–1.2)
Total Protein: 7.8 g/dL (ref 6.1–8.1)
eGFR: 77 mL/min/{1.73_m2} (ref >=60)

## 2022-04-03 LAB — CBC
HCT: 45.1 % (ref 38.5–50.0)
Hemoglobin: 14.7 g/dL (ref 13.2–17.1)
MCH: 27.3 pg (ref 27.0–33.0)
MCHC: 32.6 g/dL (ref 32.0–36.0)
MCV: 83.7 fL (ref 80.0–100.0)
MPV: 13.6 fL — ABNORMAL HIGH (ref 7.5–12.5)
Platelets: 147 10*3/uL (ref 140–400)
RBC: 5.39 10*6/uL (ref 4.20–5.80)
RDW: 14.9 % (ref 11.0–15.0)
WBC: 5.9 10*3/uL (ref 3.8–10.8)

## 2022-04-03 LAB — LIPID PANEL
Cholesterol: 181 mg/dL (ref <200)
HDL: 40 mg/dL (ref >=40)
LDL Cholesterol (Calc): 113 mg/dL (calc) — ABNORMAL HIGH
Non-HDL Cholesterol (Calc): 141 mg/dL (calc) — ABNORMAL HIGH (ref <130)
Total CHOL/HDL Ratio: 4.5 (calc) (ref <5.0)
Triglycerides: 166 mg/dL — ABNORMAL HIGH (ref <150)

## 2022-04-03 LAB — PSA: PSA: 1.73 ng/mL (ref <=4.00)

## 2022-04-03 LAB — VITAMIN D 25 HYDROXY (VIT D DEFICIENCY, FRACTURES): Vit D, 25-Hydroxy: 11 ng/mL — ABNORMAL LOW (ref 30–100)

## 2022-04-03 LAB — TSH: TSH: 4.98 mIU/L — ABNORMAL HIGH (ref 0.40–4.50)

## 2023-09-15 ENCOUNTER — Ambulatory Visit: Payer: Self-pay | Admitting: Podiatry

## 2023-09-21 ENCOUNTER — Ambulatory Visit: Admitting: Podiatry

## 2024-01-13 ENCOUNTER — Ambulatory Visit: Admitting: Podiatry

## 2024-01-13 DIAGNOSIS — B351 Tinea unguium: Secondary | ICD-10-CM | POA: Diagnosis not present

## 2024-01-13 DIAGNOSIS — M79671 Pain in right foot: Secondary | ICD-10-CM | POA: Diagnosis not present

## 2024-01-13 DIAGNOSIS — M79672 Pain in left foot: Secondary | ICD-10-CM | POA: Diagnosis not present

## 2024-01-13 NOTE — Progress Notes (Signed)
 Patient presents for evaluation and treatment of tenderness and some redness around nails feet.  Tenderness around toes with walking and wearing shoes.  Physical exam:  General appearance: Alert, pleasant, and in no acute distress.  Vascular: Pedal pulses: DP 2/4 B/L, PT 0/4 B/L. Moderate edema lower legs bilaterally  Neurologic:  Dermatologic:  Nails thickened, disfigured, discolored 1-5 BL with subungual debris.  Redness and hypertrophic nail folds along nail folds bilaterally but no signs of drainage or infection.  Musculoskeletal:     Diagnosis: 1. Painful onychomycotic nails 1 through 5 bilaterally. 2. Pain toes 1 through 5 bilaterally.  Plan: -Debrided onychomycotic nails 1 through 5 bilaterally.  Sharply debrided nails with nail clipper and reduced with a power bur.  Return 3 months [facility]

## 2024-02-10 ENCOUNTER — Ambulatory Visit (HOSPITAL_COMMUNITY): Admission: EM | Admit: 2024-02-10 | Discharge: 2024-02-10 | Disposition: A | Discharge status: Home / Self Care

## 2024-02-10 ENCOUNTER — Observation Stay (HOSPITAL_COMMUNITY)
Admission: EM | Admit: 2024-02-10 | Discharge: 2024-02-16 | DRG: 391 | Disposition: A | Source: Ambulatory Visit | Attending: Family Medicine | Admitting: Family Medicine

## 2024-02-10 ENCOUNTER — Other Ambulatory Visit: Payer: Self-pay

## 2024-02-10 ENCOUNTER — Encounter (HOSPITAL_COMMUNITY): Payer: Self-pay

## 2024-02-10 ENCOUNTER — Encounter (HOSPITAL_COMMUNITY): Payer: Self-pay | Admitting: Emergency Medicine

## 2024-02-10 DIAGNOSIS — R112 Nausea with vomiting, unspecified: Secondary | ICD-10-CM | POA: Diagnosis present

## 2024-02-10 DIAGNOSIS — N179 Acute kidney failure, unspecified: Secondary | ICD-10-CM | POA: Diagnosis present

## 2024-02-10 DIAGNOSIS — K922 Gastrointestinal hemorrhage, unspecified: Principal | ICD-10-CM | POA: Diagnosis present

## 2024-02-10 DIAGNOSIS — A0811 Acute gastroenteropathy due to Norwalk agent: Secondary | ICD-10-CM | POA: Insufficient documentation

## 2024-02-10 DIAGNOSIS — I4891 Unspecified atrial fibrillation: Secondary | ICD-10-CM | POA: Diagnosis not present

## 2024-02-10 DIAGNOSIS — E119 Type 2 diabetes mellitus without complications: Secondary | ICD-10-CM

## 2024-02-10 DIAGNOSIS — R197 Diarrhea, unspecified: Secondary | ICD-10-CM

## 2024-02-10 DIAGNOSIS — J690 Pneumonitis due to inhalation of food and vomit: Secondary | ICD-10-CM

## 2024-02-10 DIAGNOSIS — G4733 Obstructive sleep apnea (adult) (pediatric): Secondary | ICD-10-CM

## 2024-02-10 DIAGNOSIS — F0390 Unspecified dementia without behavioral disturbance: Secondary | ICD-10-CM | POA: Diagnosis present

## 2024-02-10 DIAGNOSIS — I480 Paroxysmal atrial fibrillation: Secondary | ICD-10-CM

## 2024-02-10 DIAGNOSIS — F32A Depression, unspecified: Secondary | ICD-10-CM | POA: Diagnosis present

## 2024-02-10 DIAGNOSIS — F259 Schizoaffective disorder, unspecified: Secondary | ICD-10-CM | POA: Diagnosis present

## 2024-02-10 DIAGNOSIS — I10 Essential (primary) hypertension: Secondary | ICD-10-CM | POA: Diagnosis present

## 2024-02-10 DIAGNOSIS — K2961 Other gastritis with bleeding: Secondary | ICD-10-CM | POA: Diagnosis not present

## 2024-02-10 DIAGNOSIS — R0902 Hypoxemia: Secondary | ICD-10-CM

## 2024-02-10 LAB — CBG MONITORING, ED: Glucose-Capillary: 108 mg/dL — ABNORMAL HIGH (ref 70–99)

## 2024-02-10 LAB — CBC
HCT: 50.3 % (ref 39.0–52.0)
Hemoglobin: 16.1 g/dL (ref 13.0–17.0)
MCH: 26.9 pg (ref 26.0–34.0)
MCHC: 32 g/dL (ref 30.0–36.0)
MCV: 84 fL (ref 80.0–100.0)
Platelets: 157 K/uL (ref 150–400)
RBC: 5.99 MIL/uL — ABNORMAL HIGH (ref 4.22–5.81)
RDW: 16.8 % — ABNORMAL HIGH (ref 11.5–15.5)
WBC: 4.7 K/uL (ref 4.0–10.5)
nRBC: 0 % (ref 0.0–0.2)

## 2024-02-10 LAB — COMPREHENSIVE METABOLIC PANEL WITH GFR
ALT: 24 U/L (ref 0–44)
AST: 19 U/L (ref 15–41)
Albumin: 3.7 g/dL (ref 3.5–5.0)
Alkaline Phosphatase: 67 U/L (ref 38–126)
Anion gap: 7 (ref 5–15)
BUN: 17 mg/dL (ref 8–23)
CO2: 29 mmol/L (ref 22–32)
Calcium: 8.8 mg/dL — ABNORMAL LOW (ref 8.9–10.3)
Chloride: 103 mmol/L (ref 98–111)
Creatinine, Ser: 1.39 mg/dL — ABNORMAL HIGH (ref 0.61–1.24)
GFR, Estimated: 54 mL/min — ABNORMAL LOW (ref >60)
Glucose, Bld: 120 mg/dL — ABNORMAL HIGH (ref 70–99)
Potassium: 4.3 mmol/L (ref 3.5–5.1)
Sodium: 139 mmol/L (ref 135–145)
Total Bilirubin: 0.4 mg/dL (ref 0.0–1.2)
Total Protein: 7.1 g/dL (ref 6.5–8.1)

## 2024-02-10 LAB — ABO/RH: ABO/RH(D): AB POS

## 2024-02-10 LAB — TYPE AND SCREEN
ABO/RH(D): AB POS
Antibody Screen: NEGATIVE

## 2024-02-10 LAB — GLUCOSE, CAPILLARY: Glucose-Capillary: 119 mg/dL — ABNORMAL HIGH (ref 70–99)

## 2024-02-10 LAB — TSH: TSH: 2.54 u[IU]/mL (ref 0.350–4.500)

## 2024-02-10 LAB — POC OCCULT BLOOD, ED: Fecal Occult Bld: POSITIVE — AB

## 2024-02-10 LAB — LIPASE, BLOOD: Lipase: 23 U/L (ref 11–51)

## 2024-02-10 LAB — MRSA NEXT GEN BY PCR, NASAL: MRSA by PCR Next Gen: NOT DETECTED

## 2024-02-10 LAB — MAGNESIUM: Magnesium: 2 mg/dL (ref 1.7–2.4)

## 2024-02-10 MED ORDER — METOPROLOL TARTRATE 5 MG/5ML IV SOLN
5.0000 mg | Freq: Once | INTRAVENOUS | Status: AC
  Administered 2024-02-10: 5 mg via INTRAVENOUS
  Filled 2024-02-10: qty 5

## 2024-02-10 MED ORDER — HEPARIN SODIUM (PORCINE) 5000 UNIT/ML IJ SOLN: 5000.0000 [IU] | Freq: Three times a day (TID) | INTRAMUSCULAR | Status: DC

## 2024-02-10 MED ORDER — BUPROPION HCL ER (XL) 150 MG PO TB24: 150.0000 mg | ORAL_TABLET | Freq: Every day | ORAL | Status: DC

## 2024-02-10 MED ORDER — PANTOPRAZOLE SODIUM 40 MG IV SOLR: 40.0000 mg | Freq: Two times a day (BID) | INTRAVENOUS | Status: DC

## 2024-02-10 MED ORDER — ORAL CARE MOUTH RINSE: 15.0000 mL | OROMUCOSAL | Status: DC | PRN

## 2024-02-10 MED ORDER — LACTATED RINGERS IV SOLN: INTRAVENOUS | Status: AC

## 2024-02-10 MED ORDER — SODIUM CHLORIDE 0.9 % IV SOLN: INTRAVENOUS | Status: DC

## 2024-02-10 MED ORDER — GABAPENTIN 100 MG PO CAPS
100.0000 mg | ORAL_CAPSULE | Freq: Three times a day (TID) | ORAL | Status: DC
  Administered 2024-02-10 – 2024-02-16 (×19): 100 mg via ORAL
  Filled 2024-02-10 (×19): qty 1

## 2024-02-10 MED ORDER — LACTATED RINGERS IV BOLUS
1000.0000 mL | Freq: Once | INTRAVENOUS | Status: AC
  Administered 2024-02-10: 1000 mL via INTRAVENOUS

## 2024-02-10 MED ORDER — DILTIAZEM HCL-DEXTROSE 125-5 MG/125ML-% IV SOLN (PREMIX)
5.0000 mg/h | INTRAVENOUS | Status: DC
  Administered 2024-02-10: 5 mg/h via INTRAVENOUS
  Filled 2024-02-10: qty 125

## 2024-02-10 MED ORDER — LACTATED RINGERS IV BOLUS
500.0000 mL | Freq: Once | INTRAVENOUS | Status: AC
  Administered 2024-02-10: 500 mL via INTRAVENOUS

## 2024-02-10 MED ORDER — LACTATED RINGERS IV SOLN: INTRAVENOUS | Status: DC

## 2024-02-10 MED ORDER — DONEPEZIL HCL 10 MG PO TABS
10.0000 mg | ORAL_TABLET | Freq: Every day | ORAL | Status: DC
  Administered 2024-02-11 – 2024-02-16 (×6): 10 mg via ORAL
  Filled 2024-02-10 (×6): qty 1

## 2024-02-10 MED ORDER — PANTOPRAZOLE SODIUM 40 MG IV SOLR
40.0000 mg | Freq: Two times a day (BID) | INTRAVENOUS | Status: DC
  Administered 2024-02-10 – 2024-02-11 (×2): 40 mg via INTRAVENOUS
  Filled 2024-02-10 (×2): qty 10

## 2024-02-10 MED ORDER — ACETAMINOPHEN 325 MG PO TABS
650.0000 mg | ORAL_TABLET | ORAL | Status: DC | PRN
  Administered 2024-02-13 (×3): 650 mg via ORAL
  Filled 2024-02-10 (×3): qty 2

## 2024-02-10 MED ORDER — CHLORHEXIDINE GLUCONATE CLOTH 2 % EX PADS
6.0000 | MEDICATED_PAD | Freq: Every day | CUTANEOUS | Status: DC
  Administered 2024-02-11 – 2024-02-14 (×5): 6 via TOPICAL

## 2024-02-10 MED ORDER — PANTOPRAZOLE SODIUM 40 MG IV SOLR
40.0000 mg | Freq: Once | INTRAVENOUS | Status: AC
  Administered 2024-02-10: 40 mg via INTRAVENOUS
  Filled 2024-02-10: qty 10

## 2024-02-10 MED ORDER — INSULIN ASPART 100 UNIT/ML IJ SOLN
0.0000 [IU] | Freq: Three times a day (TID) | INTRAMUSCULAR | Status: DC
  Administered 2024-02-11 – 2024-02-15 (×9): 1 [IU] via SUBCUTANEOUS
  Administered 2024-02-15: 2 [IU] via SUBCUTANEOUS
  Administered 2024-02-16 (×2): 1 [IU] via SUBCUTANEOUS
  Filled 2024-02-10 (×2): qty 1
  Filled 2024-02-10: qty 2
  Filled 2024-02-10: qty 1
  Filled 2024-02-10: qty 3
  Filled 2024-02-10 (×6): qty 1
  Filled 2024-02-10: qty 5
  Filled 2024-02-10: qty 1

## 2024-02-10 MED ORDER — ONDANSETRON HCL 4 MG/2ML IJ SOLN
4.0000 mg | Freq: Four times a day (QID) | INTRAMUSCULAR | Status: DC | PRN
  Administered 2024-02-12 – 2024-02-14 (×4): 4 mg via INTRAVENOUS
  Filled 2024-02-10 (×4): qty 2

## 2024-02-10 MED ORDER — AMLODIPINE BESYLATE 5 MG PO TABS: 10.0000 mg | ORAL_TABLET | Freq: Every day | ORAL | Status: DC

## 2024-02-10 NOTE — ED Notes (Signed)
 NURSE informed that she gave EKG to DR [facility]

## 2024-02-10 NOTE — ED Provider Notes (Signed)
 [facility]  Note:  This document was prepared using Conservation officer, historic buildings and may include unintentional dictation errors.  MRN: [id number] DOB: 01-08-1951  Subjective:   Stanley Watkins is a 73 y.o. male presenting for evaluation of abdominal discomfort, nausea/vomiting, diarrhea, since yesterday.  Patient's wife reports that vomit this morning was very dark almost black in color.  Patient states his bowel movements have been darker than normal.  Patient denies any severe abdominal pain or cramping.  Patient has not taken any over-the-counter medication to treat symptoms prior to arrival in urgent care including Pepto-Bismol.  No significant history of NSAID use for chronic pain.  No current facility-administered medications for this encounter.  Current Outpatient Medications:    donepezil (ARICEPT) 10 MG tablet, Take 10 mg by mouth daily., Disp: , Rfl:    gabapentin (NEURONTIN) 100 MG capsule, Take 100 mg by mouth 3 (three) times daily., Disp: , Rfl:    losartan-hydrochlorothiazide (HYZAAR) 100-25 MG tablet, Take 1 tablet by mouth daily., Disp: , Rfl:    amLODipine (NORVASC) 10 MG tablet, Take 10 mg by mouth daily., Disp: , Rfl:    buPROPion (WELLBUTRIN XL) 150 MG 24 hr tablet, Take 150 mg by mouth daily., Disp: , Rfl:    Multiple Vitamin (THERA) TABS, Take 1 tablet by mouth daily., Disp: , Rfl:    Vitamin D , Ergocalciferol , (DRISDOL) 1.25 MG (50000 UNIT) CAPS capsule, Take 50,000 Units by mouth once a week., Disp: , Rfl:    No Known Allergies  Past Medical History:  Diagnosis Date   Dementia (HCC)    Depression, major    Diabetes mellitus without complication (HCC)    pre diabetes   Hypertension    non complaint   Schizoaffective disorder (HCC)      History reviewed. No pertinent surgical history.  Family History  Problem Relation Age of Onset   Diabetes Mother    Cancer Father    Schizophrenia Maternal Aunt     Social History    Tobacco Use   Smoking status: Never   Smokeless tobacco: Never  Substance Use Topics   Alcohol use: No    ROS Refer to HPI for ROS details.  Objective:    Vitals: BP 113/65 (BP Location: Left Arm)   Pulse (!) 50   Temp 98.9 F (37.2 C) (Oral)   Resp 18   SpO2 94%   Physical Exam Vitals and nursing note reviewed.  Constitutional:      General: He is not in acute distress.    Appearance: Normal appearance. He is well-developed. He is not ill-appearing or toxic-appearing.  HENT:     Head: Normocephalic.     Nose: Nose normal.     Mouth/Throat:     Mouth: Mucous membranes are moist.     Pharynx: Oropharynx is clear.  Cardiovascular:     Rate and Rhythm: Normal rate.  Pulmonary:     Effort: Pulmonary effort is normal. No respiratory distress.     Breath sounds: No stridor. No wheezing.  Chest:     Chest wall: No tenderness.  Abdominal:     General: There is distension.     Palpations: Abdomen is soft.     Tenderness: There is no abdominal tenderness. There is no right CVA tenderness, left CVA tenderness, guarding or rebound.  Skin:    General: Skin is warm and dry.  Neurological:     General: No focal deficit present.     Mental Status: He  is alert and oriented to person, place, and time.  Psychiatric:        Mood and Affect: Mood normal.        Behavior: Behavior normal.     Procedures  No results found for this or any previous visit (from the past 24 hours).  Assessment and Plan :     Discharge Instructions       1. Nausea vomiting and diarrhea (Primary) - Based on current presentation and recurrent vomiting, diarrhea, and report of dark-colored/black vomit signifying possible GI bleed. - Proper management and evaluation is beyond the scope of urgent care for treating, please return report to emergency department for further evaluation and management. - Recommend follow-up in the ER after leaving urgent care for more appropriate evaluation and  treatment, involving stat laboratory testing and advanced imaging which are unavailable in urgent care.      Minnah Llamas B Yusra Ravert   Oria Klimas, [place], [place] 02/10/24 1046

## 2024-02-10 NOTE — ED Notes (Signed)
 Pt said he is unable to give urine sample at this time. Encouraged pt to use urinal.

## 2024-02-10 NOTE — Hospital Course (Signed)
 Stanley Watkins is a 73 year old male with extensive history of dementia, depression, HTN, vitamin D  deficiency, prediabetic schizoaffective disorder ... Presented to ED after evaluation at urgent care for chief complaint of nausea vomiting, diarrhea since yesterday.  Vomiting dark, black material no bright red or visible blood in her vomitus.  Last episode of vomitus this a.m.  No history of NSAID use, denies of having any abdominal pain or cramping.  Denies any fever or chills.  ED Evaluation: Blood pressure 118/85, pulse (!) 104, temperature 98.9 F (37.2 C), temperature source Oral, resp. rate 18, height 5' 11 (1.803 m), weight 131.5 kg, SpO2 98%.  LABs: CBC CMP reviewed, hemoglobin 16.1, BUN 17, creatinine 1.39, calcium 8.8, Glucose 120, Fecal Hemoccult positive x 2  Patient received IV Protonix , GI was consulted per EDP Requested patient to be admitted for evaluation

## 2024-02-10 NOTE — Assessment & Plan Note (Signed)
-   Per patient nausea vomiting, episode of diarrhea since yesterday, nausea vomiting dark-colored this a.m., no visible blood noted -Continue IV fluid, IV Protonix -As needed antiemetics -Per EDP GI consulted, appreciate evaluation

## 2024-02-10 NOTE — Assessment & Plan Note (Signed)
 Likely upper GI bleed, secondary to progressive nausea vomiting -Continue IV Protonix, as needed Maalox -N.p.o. after midnight,  - Clear liquid diet as tolerated for now -Hemogram acrid stable, monitoring closely -Avoiding: antiplatelets, chemical DVT prophylaxis -Hemoccult positive x 2

## 2024-02-10 NOTE — Consult Note (Signed)
 [facility] Consult  Referring Provider: ER Primary Care Physician:  Shelda Atlas, MD Primary Gastroenterologist: Sampson  Reason for Consultation: Black vomitus, diarrhea, black stools  HPI: Stanley Watkins is a 73 y.o. male who was in his usual state of health until 2 days ago, when he was outside at [employer], he developed sudden onset of vomiting.  As per his wife present at bedside he mostly vomited food.  Thereafter he had multiple episodes of diarrhea the following day.  Today morning he woke up at 6 AM and vomited black fluid which prompted him to come to the ER.  He also noted that stools were unusually dark.  Patient denies use of Goody powders and NSAIDs but takes aspirin 81 mg on a daily basis. He denies abdominal pain. Denies fever, denies recent travel or sick contacts or recent antibiotic use.  Denies acid reflux, heartburn, difficulty swallowing or pain on swallowing. Denies prior episode of vomiting coffee-ground material or black vomitus. Denies unintentional weight loss or loss of appetite.  No prior EGD or colonoscopy.    Past Medical History:  Diagnosis Date   Dementia (HCC)    Depression, major    Diabetes mellitus without complication (HCC)    pre diabetes   Hypertension    non complaint   Schizoaffective disorder (HCC)     History reviewed. No pertinent surgical history.  Prior to Admission medications   Medication Sig Start Date End Date Taking? Authorizing Provider  amLODipine (NORVASC) 10 MG tablet Take 10 mg by mouth daily. 12/04/23   [provider]  buPROPion (WELLBUTRIN XL) 150 MG 24 hr tablet Take 150 mg by mouth daily. 08/02/23   [provider]  donepezil (ARICEPT) 10 MG tablet Take 10 mg by mouth daily. 12/23/23   [provider]  gabapentin (NEURONTIN) 100 MG capsule Take 100 mg by mouth 3 (three) times daily.    [provider]  losartan-hydrochlorothiazide (HYZAAR) 100-25  MG tablet Take 1 tablet by mouth daily.    [provider]  Multiple Vitamin (THERA) TABS Take 1 tablet by mouth daily. 10/17/14   [provider]  Vitamin D , Ergocalciferol , (DRISDOL) 1.25 MG (50000 UNIT) CAPS capsule Take 50,000 Units by mouth once a week. 12/01/23   [provider]    Current Facility-Administered Medications  Medication Dose Route Frequency Provider Last Rate Last Admin   acetaminophen  (TYLENOL ) tablet 650 mg  650 mg Oral Q4H PRN Shahmehdi, Seyed A, MD       lactated ringers infusion   Intravenous Continuous Shahmehdi, Seyed A, MD       ondansetron  (ZOFRAN ) injection 4 mg  4 mg Intravenous Q6H PRN Shahmehdi, Seyed A, MD       pantoprazole (PROTONIX) injection 40 mg  40 mg Intravenous Q12H Shahmehdi, Seyed A, MD       Current Outpatient Medications  Medication Sig Dispense Refill   amLODipine (NORVASC) 10 MG tablet Take 10 mg by mouth daily.     buPROPion (WELLBUTRIN XL) 150 MG 24 hr tablet Take 150 mg by mouth daily.     donepezil (ARICEPT) 10 MG tablet Take 10 mg by mouth daily.     gabapentin (NEURONTIN) 100 MG capsule Take 100 mg by mouth 3 (three) times daily.     losartan-hydrochlorothiazide (HYZAAR) 100-25 MG tablet Take 1 tablet by mouth daily.     Multiple Vitamin (THERA) TABS Take 1 tablet by mouth daily.     Vitamin D , Ergocalciferol , (DRISDOL) 1.25  MG (50000 UNIT) CAPS capsule Take 50,000 Units by mouth once a week.      Allergies as of 02/10/2024   (No Known Allergies)    Family History  Problem Relation Age of Onset   Diabetes Mother    Cancer Father    Schizophrenia Maternal Aunt     Social History   Socioeconomic History   Marital status: Married    Spouse name: Not on file   Number of children: Not on file   Years of education: Not on file   Highest education level: Not on file  Occupational History   Not on file  Tobacco Use   Smoking status: Never   Smokeless tobacco: Never  Substance and Sexual Activity    Alcohol use: No   Drug use: Not Currently   Sexual activity: Not Currently  Other Topics Concern   Not on file  Social History Narrative   Not on file   Social Drivers of Health   Financial Resource Strain: Not on file  Food Insecurity: Not on file  Transportation Needs: Not on file  Physical Activity: Not on file  Stress: Not on file  Social Connections: Not on file  Intimate Partner Violence: Not on file    Review of Systems: As per HPI  Physical Exam: Vital signs in last 24 hours: Temp:  [98.9 F (37.2 C)] 98.9 F (37.2 C) (12/03 1105) Pulse Rate:  [50-104] 104 (12/03 1105) Resp:  [18] 18 (12/03 1105) BP: (113-118)/(65-85) 118/85 (12/03 1114) SpO2:  [94 %-98 %] 98 % (12/03 1105) Weight:  [131.5 kg] 131.5 kg (12/03 1112)    General:   Alert,  Well-developed, obese, pleasant and cooperative in NAD Head:  Normocephalic and atraumatic. Eyes:  Sclera clear, no icterus.   Conjunctiva pink. Ears:  Normal auditory acuity. Nose:  No deformity, discharge,  or lesions. Mouth:  No deformity or lesions.  Oropharynx pink & moist. Neck:  Supple; no masses or thyromegaly. Lungs:  Clear throughout to auscultation.   No wheezes, crackles, or rhonchi. No acute distress. Heart: Slightly tachycardic; no murmurs, clicks, rubs,  or gallops. Extremities:  Without clubbing or edema. Neurologic:  Alert and  oriented x4;  grossly normal neurologically. Skin:  Intact without significant lesions or rashes. Psych:  Alert and cooperative. Normal mood and affect. Abdomen:  Soft, nontender and nondistended. No masses, hepatosplenomegaly or hernias noted. Normal bowel sounds, without guarding, and without rebound.         Lab Results: Recent Labs    02/10/24 1214  WBC 4.7  HGB 16.1  HCT 50.3  PLT 157   BMET Recent Labs    02/10/24 1327  NA 139  K 4.3  CL 103  CO2 29  GLUCOSE 120*  BUN 17  CREATININE 1.39*  CALCIUM 8.8*   LFT Recent Labs    02/10/24 1327  PROT 7.1   ALBUMIN 3.7  AST 19  ALT 24  ALKPHOS 67  BILITOT 0.4   PT/INR No results for input(s): LABPROT, INR in the last 72 hours.  Studies/Results: No results found.  Impression: Nausea, vomiting, diarrhea compatible with gastroenteritis  Black vomitus and black stools suspicious for upper GI bleed  BUN normal at 17 with creatinine 1.39 and GFR 54 Hemoglobin normal at 16.1?  Hemoconcentrated, baseline hemoglobin around 14.7 Slightly tachycardic but not hypotensive  Plan: Will proceed with diagnostic EGD to evaluate for upper GI bleed.  Will test stool for C. difficile and GI pathogen panel as patient  has nausea, vomiting and diarrhea compatible with gastroenteritis.  Will start patient on clear liquid diet and keep n.p.o. postmidnight.  Will start patient on pantoprazole 40 mg twice a day.   LOS: 0 days   Estelita Manas, MD  02/10/2024, 3:16 PM

## 2024-02-10 NOTE — ED Triage Notes (Signed)
 Patient has been vomiting along with dark diarrhea for 2 days. Vomited tarry in color. Feels short of breath. No chest pain. Generalized abdominal pain.

## 2024-02-10 NOTE — H&P (View-Only) (Signed)
 [facility] Consult  Referring Provider: ER Primary Care Physician:  Shelda Atlas, MD Primary Gastroenterologist: [place]  Reason for Consultation: Black vomitus, diarrhea, black stools  HPI: Stanley Watkins is a 73 y.o. male who was in his usual state of health until 2 days ago, when he was outside at [employer], he developed sudden onset of vomiting.  As per his wife present at bedside he mostly vomited food.  Thereafter he had multiple episodes of diarrhea the following day.  Today morning he woke up at 6 AM and vomited black fluid which prompted him to come to the ER.  He also noted that stools were unusually dark.  Patient denies use of Goody powders and NSAIDs but takes aspirin 81 mg on a daily basis. He denies abdominal pain. Denies fever, denies recent travel or sick contacts or recent antibiotic use.  Denies acid reflux, heartburn, difficulty swallowing or pain on swallowing. Denies prior episode of vomiting coffee-ground material or black vomitus. Denies unintentional weight loss or loss of appetite.  No prior EGD or colonoscopy.    Past Medical History:  Diagnosis Date   Dementia (HCC)    Depression, major    Diabetes mellitus without complication (HCC)    pre diabetes   Hypertension    non complaint   Schizoaffective disorder (HCC)     History reviewed. No pertinent surgical history.  Prior to Admission medications   Medication Sig Start Date End Date Taking? Authorizing Provider  amLODipine (NORVASC) 10 MG tablet Take 10 mg by mouth daily. 12/04/23   [provider]  buPROPion (WELLBUTRIN XL) 150 MG 24 hr tablet Take 150 mg by mouth daily. 08/02/23   [provider]  donepezil (ARICEPT) 10 MG tablet Take 10 mg by mouth daily. 12/23/23   [provider]  gabapentin (NEURONTIN) 100 MG capsule Take 100 mg by mouth 3 (three) times daily.    [provider]  losartan-hydrochlorothiazide (HYZAAR) 100-25  MG tablet Take 1 tablet by mouth daily.    [provider]  Multiple Vitamin (THERA) TABS Take 1 tablet by mouth daily. 10/17/14   [provider]  Vitamin D , Ergocalciferol , (DRISDOL) 1.25 MG (50000 UNIT) CAPS capsule Take 50,000 Units by mouth once a week. 12/01/23   [provider]    Current Facility-Administered Medications  Medication Dose Route Frequency Provider Last Rate Last Admin   acetaminophen  (TYLENOL ) tablet 650 mg  650 mg Oral Q4H PRN Shahmehdi, Seyed A, MD       lactated ringers infusion   Intravenous Continuous Shahmehdi, Seyed A, MD       ondansetron  (ZOFRAN ) injection 4 mg  4 mg Intravenous Q6H PRN Shahmehdi, Seyed A, MD       pantoprazole (PROTONIX) injection 40 mg  40 mg Intravenous Q12H Shahmehdi, Seyed A, MD       Current Outpatient Medications  Medication Sig Dispense Refill   amLODipine (NORVASC) 10 MG tablet Take 10 mg by mouth daily.     buPROPion (WELLBUTRIN XL) 150 MG 24 hr tablet Take 150 mg by mouth daily.     donepezil (ARICEPT) 10 MG tablet Take 10 mg by mouth daily.     gabapentin (NEURONTIN) 100 MG capsule Take 100 mg by mouth 3 (three) times daily.     losartan-hydrochlorothiazide (HYZAAR) 100-25 MG tablet Take 1 tablet by mouth daily.     Multiple Vitamin (THERA) TABS Take 1 tablet by mouth daily.     Vitamin D , Ergocalciferol , (DRISDOL) 1.25  MG (50000 UNIT) CAPS capsule Take 50,000 Units by mouth once a week.      Allergies as of 02/10/2024   (No Known Allergies)    Family History  Problem Relation Age of Onset   Diabetes Mother    Cancer Father    Schizophrenia Maternal Aunt     Social History   Socioeconomic History   Marital status: Married    Spouse name: Not on file   Number of children: Not on file   Years of education: Not on file   Highest education level: Not on file  Occupational History   Not on file  Tobacco Use   Smoking status: Never   Smokeless tobacco: Never  Substance and Sexual Activity    Alcohol use: No   Drug use: Not Currently   Sexual activity: Not Currently  Other Topics Concern   Not on file  Social History Narrative   Not on file   Social Drivers of Health   Financial Resource Strain: Not on file  Food Insecurity: Not on file  Transportation Needs: Not on file  Physical Activity: Not on file  Stress: Not on file  Social Connections: Not on file  Intimate Partner Violence: Not on file    Review of Systems: As per HPI  Physical Exam: Vital signs in last 24 hours: Temp:  [98.9 F (37.2 C)] 98.9 F (37.2 C) (12/03 1105) Pulse Rate:  [50-104] 104 (12/03 1105) Resp:  [18] 18 (12/03 1105) BP: (113-118)/(65-85) 118/85 (12/03 1114) SpO2:  [94 %-98 %] 98 % (12/03 1105) Weight:  [131.5 kg] 131.5 kg (12/03 1112)    General:   Alert,  Well-developed, obese, pleasant and cooperative in NAD Head:  Normocephalic and atraumatic. Eyes:  Sclera clear, no icterus.   Conjunctiva pink. Ears:  Normal auditory acuity. Nose:  No deformity, discharge,  or lesions. Mouth:  No deformity or lesions.  Oropharynx pink & moist. Neck:  Supple; no masses or thyromegaly. Lungs:  Clear throughout to auscultation.   No wheezes, crackles, or rhonchi. No acute distress. Heart: Slightly tachycardic; no murmurs, clicks, rubs,  or gallops. Extremities:  Without clubbing or edema. Neurologic:  Alert and  oriented x4;  grossly normal neurologically. Skin:  Intact without significant lesions or rashes. Psych:  Alert and cooperative. Normal mood and affect. Abdomen:  Soft, nontender and nondistended. No masses, hepatosplenomegaly or hernias noted. Normal bowel sounds, without guarding, and without rebound.         Lab Results: Recent Labs    02/10/24 1214  WBC 4.7  HGB 16.1  HCT 50.3  PLT 157   BMET Recent Labs    02/10/24 1327  NA 139  K 4.3  CL 103  CO2 29  GLUCOSE 120*  BUN 17  CREATININE 1.39*  CALCIUM 8.8*   LFT Recent Labs    02/10/24 1327  PROT 7.1   ALBUMIN 3.7  AST 19  ALT 24  ALKPHOS 67  BILITOT 0.4   PT/INR No results for input(s): LABPROT, INR in the last 72 hours.  Studies/Results: No results found.  Impression: Nausea, vomiting, diarrhea compatible with gastroenteritis  Black vomitus and black stools suspicious for upper GI bleed  BUN normal at 17 with creatinine 1.39 and GFR 54 Hemoglobin normal at 16.1?  Hemoconcentrated, baseline hemoglobin around 14.7 Slightly tachycardic but not hypotensive  Plan: Will proceed with diagnostic EGD to evaluate for upper GI bleed.  Will test stool for C. difficile and GI pathogen panel as patient  has nausea, vomiting and diarrhea compatible with gastroenteritis.  Will start patient on clear liquid diet and keep n.p.o. postmidnight.  Will start patient on pantoprazole 40 mg twice a day.   LOS: 0 days   Estelita Manas, MD  02/10/2024, 3:16 PM

## 2024-02-10 NOTE — Assessment & Plan Note (Signed)
-   Continue home medication of Aricept

## 2024-02-10 NOTE — H&P (Signed)
 History and Physical   Patient: Stanley Watkins                            PCP: Shelda Atlas, MD                    DOB: [birth year]            DOA: 02/10/2024 FMW:[id number]             DOS: 02/10/2024, 4:34 PM  Shelda Atlas, MD  Patient coming from:   HOME  I have personally reviewed patient's medical records, in electronic medical records, including:  [facility], and care everywhere.    Chief Complaint:   Chief Complaint  Patient presents with   Emesis    History of present illness:    Stanley Watkins is a 73 year old male with extensive history of dementia, depression, HTN, vitamin D  deficiency, prediabetic schizoaffective disorder ... Presented to ED after evaluation at urgent care for chief complaint of nausea vomiting, diarrhea since yesterday.  Vomiting dark, black material no bright red or visible blood in her vomitus.  Last episode of vomitus this a.m.  No history of NSAID use, denies of having any abdominal pain or cramping.  Denies any fever or chills.  ED Evaluation: Blood pressure 118/85, pulse (!) 104, temperature 98.9 F (37.2 C), temperature source Oral, resp. rate 18, height 5' 11 (1.803 m), weight 131.5 kg, SpO2 98%.  LABs: CBC CMP reviewed, hemoglobin 16.1, BUN 17, creatinine 1.39, calcium 8.8, Glucose 120, Fecal Hemoccult positive x 2  Patient received IV Protonix, GI was consulted per EDP Requested patient to be admitted for evaluation    Patient Denies having: Fever, Chills, Cough, SOB, Chest Pain, Abd pain, N/V/D, headache, dizziness, lightheadedness,  Dysuria, Joint pain, rash, open wounds  ED Course:   Blood pressure 118/85, pulse (!) 104, temperature 98.7 F (37.1 C), temperature source Oral, resp. rate 18, height 5' 11 (1.803 m), weight 131.5 kg, SpO2 98%. Abnormal labs;   Review of Systems: As per HPI, otherwise 10 point review of systems were negative.    ----------------------------------------------------------------------------------------------------------------------  No Known Allergies  Home MEDs:  Prior to Admission medications   Medication Sig Start Date End Date Taking? Authorizing Provider  amLODipine (NORVASC) 10 MG tablet Take 10 mg by mouth daily. 12/04/23   [provider]  buPROPion (WELLBUTRIN XL) 150 MG 24 hr tablet Take 150 mg by mouth daily. 08/02/23   [provider]  donepezil (ARICEPT) 10 MG tablet Take 10 mg by mouth daily. 12/23/23   [provider]  gabapentin (NEURONTIN) 100 MG capsule Take 100 mg by mouth 3 (three) times daily.    [provider]  losartan-hydrochlorothiazide (HYZAAR) 100-25 MG tablet Take 1 tablet by mouth daily.    [provider]  Multiple Vitamin (THERA) TABS Take 1 tablet by mouth daily. 10/17/14   [provider]  Vitamin D , Ergocalciferol , (DRISDOL) 1.25 MG (50000 UNIT) CAPS capsule Take 50,000 Units by mouth once a week. 12/01/23   [provider]    PRN MEDs: acetaminophen , ondansetron  (ZOFRAN ) IV  Past Medical History:  Diagnosis Date   Dementia (HCC)    Depression, major    Diabetes mellitus without complication (HCC)    pre diabetes   Hypertension    non complaint   Schizoaffective disorder (HCC)     History reviewed. No pertinent surgical history.   reports that he  has never smoked. He has never used smokeless tobacco. He reports that he does not currently use drugs. He reports that he does not drink alcohol.   Family History  Problem Relation Age of Onset   Diabetes Mother    Cancer Father    Schizophrenia Maternal Aunt     Physical Exam:   Vitals:   02/10/24 1105 02/10/24 1112 02/10/24 1114 02/10/24 1529  BP:   118/85   Pulse: (!) 104     Resp: 18     Temp: 98.9 F (37.2 C)   98.7 F (37.1 C)  TempSrc: Oral   Oral  SpO2: 98%     Weight:  131.5 kg    Height:  5' 11 (1.803 m)      Constitutional: NAD, calm, comfortable Eyes: PERRL, lids and conjunctivae normal ENMT: Mucous membranes are moist. Posterior pharynx clear of any exudate or lesions.Normal dentition.  Neck: normal, supple, no masses, no thyromegaly Respiratory: clear to auscultation bilaterally, no wheezing, no crackles. Normal respiratory effort. No accessory muscle use.  Cardiovascular: Regular rate and rhythm, no murmurs / rubs / gallops. No extremity edema. 2+ pedal pulses. No carotid bruits.  Abdomen: no tenderness, no masses palpated. No hepatosplenomegaly. Bowel sounds positive.  Musculoskeletal: no clubbing / cyanosis. No joint deformity upper and lower extremities. Good ROM, no contractures. Normal muscle tone.  Neurologic: CN II-XII grossly intact. Sensation intact, DTR normal. Strength 5/5 in all 4.  Psychiatric: Normal judgment and insight. Alert and oriented x 3. Normal mood.  Skin: no rashes, lesions, ulcers. No induration Decubitus/ulcers:  Wounds: per nursing documentation         Labs on admission:    I have personally reviewed following labs and imaging studies  CBC: Recent Labs  Lab 02/10/24 1214  WBC 4.7  HGB 16.1  HCT 50.3  MCV 84.0  PLT 157   Basic Metabolic Panel: Recent Labs  Lab 02/10/24 1327  NA 139  K 4.3  CL 103  CO2 29  GLUCOSE 120*  BUN 17  CREATININE 1.39*  CALCIUM 8.8*   GFR: Estimated Creatinine Clearance: 67.4 mL/min (A) (by C-G formula based on SCr of 1.39 mg/dL (H)). Liver Function Tests: Recent Labs  Lab 02/10/24 1327  AST 19  ALT 24  ALKPHOS 67  BILITOT 0.4  PROT 7.1  ALBUMIN 3.7   Recent Labs  Lab 02/10/24 1327  LIPASE 23       Component Value Date/Time   COLORURINE YELLOW 04/28/2012 2133   APPEARANCEUR CLEAR 04/28/2012 2133   LABSPEC 1.025 04/10/2014 1914   PHURINE 5.5 04/10/2014 1914   GLUCOSEU NEGATIVE 04/10/2014 1914   HGBUR NEGATIVE 04/10/2014 1914   BILIRUBINUR NEGATIVE 04/10/2014 1914   KETONESUR NEGATIVE  04/10/2014 1914   PROTEINUR NEGATIVE 04/10/2014 1914   UROBILINOGEN 0.2 04/10/2014 1914   NITRITE NEGATIVE 04/10/2014 1914   LEUKOCYTESUR NEGATIVE 04/10/2014 1914    Last A1C:  Lab Results  Component Value Date   HGBA1C 6.6 (H) 11/19/2015     Radiologic Exams on Admission:   No results found.  EKG:   Independently reviewed.  Orders placed or performed during the hospital encounter of 02/10/24   ED EKG   ED EKG   EKG 12-Lead   EKG 12-Lead   EKG 12-Lead (at 6am)   EKG 12-Lead   EKG 12-Lead   EKG 12-Lead   EKG 12-Lead   ---------------------------------------------------------------------------------------------------------------------------------------    Assessment / Plan:   Principal Problem:   GIB (gastrointestinal bleeding) Active  Problems:   A-fib (HCC)   Schizoaffective disorder (HCC)   AKI (acute kidney injury)   Type 2 diabetes mellitus (HCC)   Depression   Essential hypertension   Dementia without behavioral disturbance (HCC)   Nausea & vomiting   Assessment and Plan: * GIB (gastrointestinal bleeding) Likely upper GI bleed, secondary to progressive nausea vomiting -Continue IV Protonix, as needed Maalox -N.p.o. after midnight,  - Clear liquid diet as tolerated for now -Hemogram acrid stable, monitoring closely -Avoiding: antiplatelets, chemical DVT prophylaxis -Hemoccult positive x 2  A-fib (HCC) New onset A-fib with RVR   Addendum: After patient was admitted, received 1 L of LR -Heart rate steadily was running high, as high as 158, EKG was obtained, confirmed A-fib with RVR  Patient wife reports no history of atrial fibrillation not on any rate control medication or anticoagulation  - Cardiology Dr. Mona consulted - Will give addition LR bolus 5 point, IV metoprolol 5 mg x 1 - Will change patient's admission request for telemetry  Appreciate cardiology evaluation recommendations    Type 2 diabetes mellitus (HCC) Lab Results   Component Value Date   HGBA1C 6.6 (H) 11/19/2015   HGBA1C 6.2 (H) 11/13/2012   - Currently patient is not on any diabetic medications,  -Will monitor CBG closely  AKI (acute kidney injury)  Lab Results  Component Value Date   CREATININE 1.39 (H) 02/10/2024   CREATININE 1.04 04/02/2022   CREATININE 1.22 03/27/2020   - Mildly elevated BUN and creatinine from baseline due to likely dehydration, secondary to nausea vomiting -Avoid, holding home medication of hydrochlorothiazide, losartan -Avoiding nephrotoxins, hypotension  Schizoaffective disorder (HCC) - Currently stable, reviewing home medication, will continue Wellbutrin, Neurontin,  Dementia without behavioral disturbance (HCC) - Continue home medication of Aricept  Essential hypertension - BP stable, continue home medication of Norvasc - Holding losartan-HCTZ due to mild dehydration, and AKI  Depression Continue home medication of Wellbutrin  Nausea & vomiting - Per patient nausea vomiting, episode of diarrhea since yesterday, nausea vomiting dark-colored this a.m., no visible blood noted -Continue IV fluid, IV Protonix -As needed antiemetics -Per EDP GI consulted, appreciate evaluation     Consults called: Gastroenterology -------------------------------------------------------------------------------------------------------------------------------------------- DVT prophylaxis:  SCDs Start: 02/10/24 1513   Code Status:   Code Status: Full Code   Admission status: Patient will be admitted as Observation, with a greater than 2 midnight length of stay. Level of care: Med-Surg   Family Communication:  none at bedside  (The above findings and plan of care has been discussed with patient in detail, the patient expressed understanding and agreement of above plan)  ---------------------------------------------------------------------------------------------------------------------------------------  Disposition  Plan:  Anticipated 1-2 days Status is: Observation The patient remains OBS appropriate and will d/c before 2 midnights.   ---------------------------------------------------------------------------------------------------------------------------------------  Time spent:  28  Min.  Was spent seeing and evaluating the patient, reviewing all medical records, drawn plan of care.  SIGNED: Adriana DELENA Grams, MD, FHM. FAAFP. [facility], Pager  (Please use [email or web address] to page/ or secure chat through epic) If 7PM-7AM, please contact night-coverage [email or web address],  02/10/2024, 4:34 PM

## 2024-02-10 NOTE — ED Notes (Signed)
 Patient is being discharged from the Urgent Care and sent to the Emergency Department via POV with family. Per Hosey Aguas, NP, patient is in need of higher level of care due to dark colored emesis. Patient is aware and verbalizes understanding of plan of care.  Vitals:   02/10/24 1005  BP: 113/65  Pulse: (!) 50  Resp: 18  Temp: 98.9 F (37.2 C)  SpO2: 94%

## 2024-02-10 NOTE — Assessment & Plan Note (Signed)
 New onset A-fib with RVR   Addendum: After patient was admitted, received 1 L of LR -Heart rate steadily was running high, as high as 158, EKG was obtained, confirmed A-fib with RVR  Patient wife reports no history of atrial fibrillation not on any rate control medication or anticoagulation  - Cardiology Dr. Mona consulted - Will give addition LR bolus 5 point, IV metoprolol 5 mg x 1 - Will change patient's admission request for telemetry  Appreciate cardiology evaluation recommendations

## 2024-02-10 NOTE — Assessment & Plan Note (Signed)
-   Currently stable, reviewing home medication, will continue Wellbutrin, Neurontin,

## 2024-02-10 NOTE — Consult Note (Signed)
 CONSULTATION NOTE   Patient Name: Stanley Watkins Date of Encounter: 02/10/2024 Cardiologist: None Electrophysiologist: None Advanced Heart Failure: None   Chief Complaint   AFib with RVR  Patient Profile   73 yo male presented with N/V/D and dark stools x 2 days suggestive of GIB. Found to go into afib with RVR.   HPI   Stanley Watkins is a 73 y.o. male who is being seen today for the evaluation of afib with RVR at the request of Dr. Willette. This is a pleasant 73 year old male with a history of depression, hypertension, schizoaffective disorder and dementia, prediabetes and morbid obesity.  He apparently also had had an abnormal sleep study in the past but never followed up with CPAP.  He presented with vomiting that was tarry and dark stools for 2 days.  No history of GI bleeding or significant NSAID use other than low-dose aspirin.  While in the emergency department he went into atrial fibrillation with rapid ventricular response.  Cardiology was asked to evaluate.  Telemetry reveals A-fib with heart rates up to the 150s.  He was ordered for 5 mg of metoprolol which has not yet been given.  When I saw him on exam he is lying supine and appears in no acute distress, he denies any chest pain or shortness of breath.  He also was being given IV fluids.  He is on amlodipine at home for hypertension as well as Hyzaar.  PMHx   Past Medical History:  Diagnosis Date   Dementia (HCC)    Depression, major    Diabetes mellitus without complication (HCC)    pre diabetes   Hypertension    non complaint   Schizoaffective disorder (HCC)     History reviewed. No pertinent surgical history.  FAMHx   Family History  Problem Relation Age of Onset   Diabetes Mother    Cancer Father    Schizophrenia Maternal Aunt     SOCHx    reports that he has never smoked. He has never used smokeless tobacco. He reports that he does not currently use drugs. He reports that he does  not drink alcohol.  Outpatient Medications   No current facility-administered medications on file prior to encounter.   Current Outpatient Medications on File Prior to Encounter  Medication Sig Dispense Refill   amLODipine (NORVASC) 10 MG tablet Take 10 mg by mouth daily.     aspirin EC 81 MG tablet Take 81 mg by mouth daily. Swallow whole.     donepezil (ARICEPT) 10 MG tablet Take 10 mg by mouth daily.     gabapentin (NEURONTIN) 100 MG capsule Take 100 mg by mouth 2 (two) times daily.     losartan-hydrochlorothiazide (HYZAAR) 100-25 MG tablet Take 1 tablet by mouth daily.     Vitamin D , Ergocalciferol , (DRISDOL) 1.25 MG (50000 UNIT) CAPS capsule Take 50,000 Units by mouth once a week.     buPROPion (WELLBUTRIN XL) 150 MG 24 hr tablet Take 150 mg by mouth daily. (Patient not taking: Reported on 02/10/2024)      Inpatient Medications    Scheduled Meds:  amLODipine  10 mg Oral Daily   [START ON 02/11/2024] donepezil  10 mg Oral Daily   gabapentin  100 mg Oral TID   insulin  aspart  0-9 Units Subcutaneous TID WC   metoprolol tartrate  5 mg Intravenous Once   pantoprazole (PROTONIX) IV  40 mg Intravenous Q12H    Continuous Infusions:  lactated ringers  lactated ringers 125 mL/hr at 02/10/24 1530    PRN Meds: acetaminophen , ondansetron  (ZOFRAN ) IV   ALLERGIES   No Known Allergies  ROS   Pertinent items noted in HPI and remainder of comprehensive ROS otherwise negative.  Vitals   Vitals:   02/10/24 1105 02/10/24 1112 02/10/24 1114 02/10/24 1529  BP:   118/85   Pulse: (!) 104     Resp: 18     Temp: 98.9 F (37.2 C)   98.7 F (37.1 C)  TempSrc: Oral   Oral  SpO2: 98%     Weight:  131.5 kg    Height:  5' 11 (1.803 m)      Intake/Output Summary (Last 24 hours) at 02/10/2024 1635 Last data filed at 02/10/2024 1631 Gross per 24 hour  Intake 1000 ml  Output --  Net 1000 ml   Filed Weights   02/10/24 1112  Weight: 131.5 kg    Physical Exam   General  appearance: alert, no distress, and morbidly obese Neck: no carotid bruit, no JVD, and thyroid not enlarged, symmetric, no tenderness/mass/nodules Lungs: clear to auscultation bilaterally Heart: irregularly irregular rhythm and tachycardic Abdomen: protuberant, mild TTP Extremities: extremities normal, atraumatic, no cyanosis or edema Pulses: 2+ and symmetric Skin: Skin color, texture, turgor normal. No rashes or lesions Neurologic: Grossly normal Psych: Flat affect  Labs   Results for orders placed or performed during the hospital encounter of 02/10/24 (from the past 48 hours)  Type and screen     Status: None   Collection Time: 02/10/24 11:55 AM  Result Value Ref Range   ABO/RH(D) AB POS    Antibody Screen NEG    Sample Expiration      02/13/2024,2359 Performed at [facility], [address] [place], [place] [address]   POC occult blood, ED RN will collect     Status: Abnormal   Collection Time: 02/10/24 12:08 PM  Result Value Ref Range   Fecal Occult Bld POSITIVE (A) NEGATIVE  CBC     Status: Abnormal   Collection Time: 02/10/24 12:14 PM  Result Value Ref Range   WBC 4.7 4.0 - 10.5 K/uL   RBC 5.99 (H) 4.22 - 5.81 MIL/uL   Hemoglobin 16.1 13.0 - 17.0 g/dL   HCT 49.6 60.9 - 47.9 %   MCV 84.0 80.0 - 100.0 fL   MCH 26.9 26.0 - 34.0 pg   MCHC 32.0 30.0 - 36.0 g/dL   RDW 83.1 (H) 88.4 - 84.4 %   Platelets 157 150 - 400 K/uL   nRBC 0.0 0.0 - 0.2 %    Comment: Performed at [facility], [address] [place], [place] [address]  Comprehensive metabolic panel with GFR     Status: Abnormal   Collection Time: 02/10/24  1:27 PM  Result Value Ref Range   Sodium 139 135 - 145 mmol/L   Potassium 4.3 3.5 - 5.1 mmol/L   Chloride 103 98 - 111 mmol/L   CO2 29 22 - 32 mmol/L   Glucose, Bld 120 (H) 70 - 99 mg/dL    Comment: Glucose reference range applies only to samples taken after fasting for at least 8 hours.   BUN 17 8 - 23 mg/dL    Creatinine, Ser 8.60 (H) 0.61 - 1.24 mg/dL   Calcium 8.8 (L) 8.9 - 10.3 mg/dL   Total Protein 7.1 6.5 - 8.1 g/dL   Albumin 3.7 3.5 - 5.0 g/dL   AST 19 15 - 41  U/L   ALT 24 0 - 44 U/L   Alkaline Phosphatase 67 38 - 126 U/L   Total Bilirubin 0.4 0.0 - 1.2 mg/dL   GFR, Estimated 54 (L) >60 mL/min    Comment: (NOTE) Calculated using the CKD-EPI Creatinine Equation (2021)    Anion gap 7 5 - 15    Comment: Performed at [facility], [address] [place], [place] [address]  Lipase, blood     Status: None   Collection Time: 02/10/24  1:27 PM  Result Value Ref Range   Lipase 23 11 - 51 U/L    Comment: Performed at [facility], [address], [place], [place] [address]    ECG   Afib with RVR at 136 - Personally Reviewed  Telemetry   AFib with RVR - rates in the 140-150's - Personally Reviewed  Radiology   No results found.  Cardiac Studies   N/A  Impression   Principal Problem:   GIB (gastrointestinal bleeding) Active Problems:   Schizoaffective disorder (HCC)   Depression   AKI (acute kidney injury)   Type 2 diabetes mellitus (HCC)   Nausea & vomiting   Essential hypertension   Dementia without behavioral disturbance (HCC)   A-fib (HCC)   Recommendation   Mr. Brownstein has new onset atrial fibrillation with rapid ventricular response.  Possible contributing factor is a recent nausea, vomiting and diarrhea, GI bleeding, obesity, untreated obstructive sleep apnea, etc.  Will start IV diltiazem for rate control since he cannot be anticoagulated in the setting of presumed GI bleeding.  Rate control strategy is our only option at this time.  Agree with fluid repletion and electrolyte correction to keep potassium greater than 4.0 and magnesium  greater than 2.0. Will order magnesium  check and TSH. Will also order an echocardiogram for tomorrow if heart rate has improved.  He should have a repeat outpatient sleep study if he would be  agreeable to considering CPAP.  Weight loss would also be beneficial to lower his risk of recurrent atrial fibrillation.  Thanks for the consultation.  Cardiology will follow with you.  Time Spent Directly with Patient:  I have spent a total of 45 minutes with the patient reviewing hospital notes, telemetry, EKGs, labs and examining the patient as well as establishing an assessment and plan that was discussed personally with the patient.  > 50% of time was spent in direct patient care.  Length of Stay:  LOS: 0 days   Vinie KYM Maxcy, MD, [facility], FNLA, FACP  [place]  [facility]  Medical Director of the [facility] Lipid Disorders &  Cardiovascular Risk Reduction Clinic Diplomate of the American Board of Clinical Lipidology Attending Cardiologist  Direct Dial: [phone]  Fax: [phone]  Website:  [email or web address] Vinie JAYSON Maxcy 02/10/2024, 4:35 PM

## 2024-02-10 NOTE — Assessment & Plan Note (Signed)
  Lab Results  Component Value Date   CREATININE 1.39 (H) 02/10/2024   CREATININE 1.04 04/02/2022   CREATININE 1.22 03/27/2020   - Mildly elevated BUN and creatinine from baseline due to likely dehydration, secondary to nausea vomiting -Avoid, holding home medication of hydrochlorothiazide, losartan -Avoiding nephrotoxins, hypotension

## 2024-02-10 NOTE — ED Provider Notes (Signed)
 [facility] EM[facility]GENC[facility] DEPARTMENT AT [facility] Provider Note   CSN: [id number] Arrival date & time: 02/10/24  1102     Patient presents with: Emesis   Stanley Watkins is a 73 y.o. male.   73 year old male presents with tarry vomitus as well as black stools x 2 days.  No prior history of GI bleeding does not take any blood thinners at this time.  Patient denies any NSAID use.  Went to urgent care before arrival here with same symptoms.  Notes weakness when he tries to stand up.  Was told to come here for further evaluation       Prior to Admission medications   Medication Sig Start Date End Date Taking? Authorizing Provider  amLODipine (NORVASC) 10 MG tablet Take 10 mg by mouth daily. 12/04/23   [provider]  buPROPion (WELLBUTRIN XL) 150 MG 24 hr tablet Take 150 mg by mouth daily. 08/02/23   [provider]  donepezil (ARICEPT) 10 MG tablet Take 10 mg by mouth daily. 12/23/23   [provider]  gabapentin (NEURONTIN) 100 MG capsule Take 100 mg by mouth 3 (three) times daily.    [provider]  losartan-hydrochlorothiazide (HYZAAR) 100-25 MG tablet Take 1 tablet by mouth daily.    [provider]  Multiple Vitamin (THERA) TABS Take 1 tablet by mouth daily. 10/17/14   [provider]  Vitamin D , Ergocalciferol , (DRISDOL) 1.25 MG (50000 UNIT) CAPS capsule Take 50,000 Units by mouth once a week. 12/01/23   [provider]    Allergies: Patient has no known allergies.    Review of Systems  All other systems reviewed and are negative.   Updated Vital Signs BP 118/85   Pulse (!) 104   Temp 98.9 F (37.2 C) (Oral)   Resp 18   Ht 1.803 m (5' 11)   Wt 131.5 kg   SpO2 98%   BMI 40.45 kg/m   Physical Exam Vitals and nursing note reviewed. Exam conducted with a chaperone present.  Constitutional:      General: He is not in acute distress.    Appearance: Normal appearance. He is well-developed. He  is not toxic-appearing.  HENT:     Head: Normocephalic and atraumatic.  Eyes:     General: Lids are normal.     Conjunctiva/sclera: Conjunctivae normal.     Pupils: Pupils are equal, round, and reactive to light.  Neck:     Thyroid: No thyroid mass.     Trachea: No tracheal deviation.  Cardiovascular:     Rate and Rhythm: Normal rate and regular rhythm.     Heart sounds: Normal heart sounds. No murmur heard.    No gallop.  Pulmonary:     Effort: Pulmonary effort is normal. No respiratory distress.     Breath sounds: Normal breath sounds. No stridor. No decreased breath sounds, wheezing, rhonchi or rales.  Abdominal:     General: There is no distension.     Palpations: Abdomen is soft.     Tenderness: There is abdominal tenderness in the epigastric area. There is no rebound.   Genitourinary:    Comments: Yellow stool noted on DRE Musculoskeletal:        General: No tenderness. Normal range of motion.     Cervical back: Normal range of motion and neck supple.  Skin:    General: Skin is warm and dry.     Findings: No abrasion or rash.  Neurological:  Mental Status: He is alert and oriented to person, place, and time. Mental status is at baseline.     GCS: GCS eye subscore is 4. GCS verbal subscore is 5. GCS motor subscore is 6.     Cranial Nerves: No cranial nerve deficit.     Sensory: No sensory deficit.     Motor: Motor function is intact.  Psychiatric:        Attention and Perception: Attention normal.        Speech: Speech normal.        Behavior: Behavior normal.     (all labs ordered are listed, but only abnormal results are displayed) Labs Reviewed  LIPASE, BLOOD  COMPREHENSIVE METABOLIC PANEL WITH GFR  CBC  URINALYSIS, ROUTINE W REFLEX MICROSCOPIC  POC OCCULT BLOOD, ED  TYPE AND SCREEN    EKG: EKG Interpretation Date/Time:  Wednesday February 10 2024 11:15:37 EST Ventricular Rate:  132 PR Interval:  152 QRS Duration:  84 QT Interval:  315 QTC  Calculation: 388 R Axis:   -53  Text Interpretation: Sinus tachycardia Ventricular bigeminy LAE, consider biatrial enlargement Left anterior fascicular block Abnormal R-wave progression, early transition Minimal ST depression Confirmed by Dasie Faden ([id number]) on 02/10/2024 11:34:02 AM  Radiology: No results found.   Procedures   Medications Ordered in the ED  lactated ringers bolus 1,000 mL (has no administration in time range)  lactated ringers infusion (has no administration in time range)                                    Medical Decision Making Amount and/or Complexity of Data Reviewed Labs: ordered.  Risk Prescription drug management.  EKG shows sinus tachycardia Patient given IV fluids here as he was tachycardic on arrival.  Patient improved slightly but then developed SVT.  Responded to fluids.  Heart rate has decreased to sinus sinus tach.  Electrolytes significant for some mild AKI.  He was guaiac positive from below.  Suspect upper GI source.  Patient started on Protonix.  Secure chat message sent to Dr. Herschell on-call for GI.  Will admit to the hospitalist team  CRITICAL CARE Performed by: Faden ONEIDA Dasie Total critical care time: 50 minutes Critical care time was exclusive of separately billable procedures and treating other patients. Critical care was necessary to treat or prevent imminent or life-threatening deterioration. Critical care was time spent personally by me on the following activities: development of treatment plan with patient and/or surrogate as well as nursing, discussions with consultants, evaluation of patient's response to treatment, examination of patient, obtaining history from patient or surrogate, ordering and performing treatments and interventions, ordering and review of laboratory studies, ordering and review of radiographic studies, pulse oximetry and re-evaluation of patient's condition.      Final diagnoses:  None    ED Discharge  Orders     None          Dasie Faden, MD 02/10/24 1448

## 2024-02-10 NOTE — Assessment & Plan Note (Signed)
 Lab Results  Component Value Date   HGBA1C 6.6 (H) 11/19/2015   HGBA1C 6.2 (H) 11/13/2012   - Currently patient is not on any diabetic medications,  -Will monitor CBG closely

## 2024-02-10 NOTE — Assessment & Plan Note (Signed)
 Continue home medication of Wellbutrin

## 2024-02-10 NOTE — ED Triage Notes (Signed)
 Male with patient reports that yesterday when they went out to breakfast pt started having vomiting and diarrhea. Reports had diarrhea the rest of the days. Today did vomit and was very dark black in color. Pt only had soup yesterday.

## 2024-02-10 NOTE — Assessment & Plan Note (Addendum)
-   BP stable, continue home medication of Norvasc - Holding losartan-HCTZ due to mild dehydration, and AKI

## 2024-02-10 NOTE — Discharge Instructions (Signed)
  1. Nausea vomiting and diarrhea (Primary) - Based on current presentation and recurrent vomiting, diarrhea, and report of dark-colored/black vomit signifying possible GI bleed. - Proper management and evaluation is beyond the scope of urgent care for treating, please return report to emergency department for further evaluation and management. - Recommend follow-up in the ER after leaving urgent care for more appropriate evaluation and treatment, involving stat laboratory testing and advanced imaging which are unavailable in urgent care.

## 2024-02-11 ENCOUNTER — Inpatient Hospital Stay (HOSPITAL_COMMUNITY): Admitting: Certified Registered"

## 2024-02-11 ENCOUNTER — Other Ambulatory Visit: Payer: Self-pay

## 2024-02-11 ENCOUNTER — Inpatient Hospital Stay

## 2024-02-11 ENCOUNTER — Encounter (HOSPITAL_COMMUNITY): Payer: Self-pay | Admitting: Family Medicine

## 2024-02-11 ENCOUNTER — Encounter (HOSPITAL_COMMUNITY): Admission: EM | Disposition: A | Payer: Self-pay | Source: Ambulatory Visit | Attending: Family Medicine

## 2024-02-11 ENCOUNTER — Inpatient Hospital Stay (HOSPITAL_COMMUNITY)

## 2024-02-11 DIAGNOSIS — E119 Type 2 diabetes mellitus without complications: Secondary | ICD-10-CM

## 2024-02-11 DIAGNOSIS — F259 Schizoaffective disorder, unspecified: Secondary | ICD-10-CM

## 2024-02-11 DIAGNOSIS — K921 Melena: Secondary | ICD-10-CM

## 2024-02-11 DIAGNOSIS — R112 Nausea with vomiting, unspecified: Secondary | ICD-10-CM

## 2024-02-11 DIAGNOSIS — I4891 Unspecified atrial fibrillation: Secondary | ICD-10-CM

## 2024-02-11 DIAGNOSIS — N179 Acute kidney failure, unspecified: Secondary | ICD-10-CM

## 2024-02-11 DIAGNOSIS — I480 Paroxysmal atrial fibrillation: Secondary | ICD-10-CM

## 2024-02-11 DIAGNOSIS — I10 Essential (primary) hypertension: Secondary | ICD-10-CM

## 2024-02-11 DIAGNOSIS — F32A Depression, unspecified: Secondary | ICD-10-CM

## 2024-02-11 DIAGNOSIS — F0390 Unspecified dementia without behavioral disturbance: Secondary | ICD-10-CM | POA: Diagnosis not present

## 2024-02-11 HISTORY — PX: ESOPHAGOGASTRODUODENOSCOPY: SHX5428

## 2024-02-11 LAB — CBC
HCT: 44.2 % (ref 39.0–52.0)
HCT: 47.2 % (ref 39.0–52.0)
Hemoglobin: 14.2 g/dL (ref 13.0–17.0)
Hemoglobin: 14.8 g/dL (ref 13.0–17.0)
MCH: 27.2 pg (ref 26.0–34.0)
MCH: 27.1 pg (ref 26.0–34.0)
MCHC: 32.1 g/dL (ref 30.0–36.0)
MCHC: 31.4 g/dL (ref 30.0–36.0)
MCV: 84.5 fL (ref 80.0–100.0)
MCV: 86.4 fL (ref 80.0–100.0)
Platelets: 148 K/uL — ABNORMAL LOW (ref 150–400)
Platelets: 119 K/uL — ABNORMAL LOW (ref 150–400)
RBC: 5.23 MIL/uL (ref 4.22–5.81)
RBC: 5.46 MIL/uL (ref 4.22–5.81)
RDW: 16.1 % — ABNORMAL HIGH (ref 11.5–15.5)
RDW: 16.4 % — ABNORMAL HIGH (ref 11.5–15.5)
WBC: 4.4 K/uL (ref 4.0–10.5)
WBC: 4.7 K/uL (ref 4.0–10.5)
nRBC: 0 % (ref 0.0–0.2)
nRBC: 0 % (ref 0.0–0.2)

## 2024-02-11 LAB — BASIC METABOLIC PANEL WITH GFR
Anion gap: 7 (ref 5–15)
Anion gap: 9 (ref 5–15)
BUN: 14 mg/dL (ref 8–23)
BUN: 15 mg/dL (ref 8–23)
CO2: 26 mmol/L (ref 22–32)
CO2: 27 mmol/L (ref 22–32)
Calcium: 8.5 mg/dL — ABNORMAL LOW (ref 8.9–10.3)
Calcium: 8.9 mg/dL (ref 8.9–10.3)
Chloride: 103 mmol/L (ref 98–111)
Chloride: 104 mmol/L (ref 98–111)
Creatinine, Ser: 1.08 mg/dL (ref 0.61–1.24)
Creatinine, Ser: 1.12 mg/dL (ref 0.61–1.24)
GFR, Estimated: >60 mL/min (ref >60)
GFR, Estimated: >60 mL/min (ref >60)
Glucose, Bld: 123 mg/dL — ABNORMAL HIGH (ref 70–99)
Glucose, Bld: 142 mg/dL — ABNORMAL HIGH (ref 70–99)
Potassium: 3.7 mmol/L (ref 3.5–5.1)
Potassium: 3.9 mmol/L (ref 3.5–5.1)
Sodium: 136 mmol/L (ref 135–145)
Sodium: 138 mmol/L (ref 135–145)

## 2024-02-11 LAB — HEMOGLOBIN A1C
Hgb A1c MFr Bld: 7.2 % — ABNORMAL HIGH (ref 4.8–5.6)
Mean Plasma Glucose: 160 mg/dL

## 2024-02-11 LAB — GLUCOSE, CAPILLARY
Glucose-Capillary: 128 mg/dL — ABNORMAL HIGH (ref 70–99)
Glucose-Capillary: 114 mg/dL — ABNORMAL HIGH (ref 70–99)
Glucose-Capillary: 141 mg/dL — ABNORMAL HIGH (ref 70–99)
Glucose-Capillary: 116 mg/dL — ABNORMAL HIGH (ref 70–99)

## 2024-02-11 LAB — ECHOCARDIOGRAM COMPLETE
Area-P 1/2: 5.58 cm2
Height: 71 in
S' Lateral: 2.4 cm
Weight: 4462.11 [oz_av]

## 2024-02-11 LAB — MAGNESIUM: Magnesium: 1.9 mg/dL (ref 1.7–2.4)

## 2024-02-11 LAB — PHOSPHORUS
Phosphorus: 2 mg/dL — ABNORMAL LOW (ref 2.5–4.6)
Phosphorus: 2.1 mg/dL — ABNORMAL LOW (ref 2.5–4.6)

## 2024-02-11 SURGERY — EGD (ESOPHAGOGASTRODUODENOSCOPY)
Anesthesia: Monitor Anesthesia Care

## 2024-02-11 MED ORDER — LABETALOL HCL 5 MG/ML IV SOLN
INTRAVENOUS | Status: AC
  Filled 2024-02-11: qty 4

## 2024-02-11 MED ORDER — LABETALOL HCL 5 MG/ML IV SOLN
5.0000 mg | Freq: Once | INTRAVENOUS | Status: AC
  Administered 2024-02-11: 5 mg via INTRAVENOUS

## 2024-02-11 MED ORDER — LIDOCAINE 2% (20 MG/ML) 5 ML SYRINGE
INTRAMUSCULAR | Status: DC | PRN
  Administered 2024-02-11: 100 mg via INTRAVENOUS

## 2024-02-11 MED ORDER — LOSARTAN POTASSIUM 50 MG PO TABS
75.0000 mg | ORAL_TABLET | Freq: Once | ORAL | Status: AC
  Administered 2024-02-11: 75 mg via ORAL
  Filled 2024-02-11: qty 2

## 2024-02-11 MED ORDER — PROPOFOL 500 MG/50ML IV EMUL
INTRAVENOUS | Status: DC | PRN
  Administered 2024-02-11: 150 ug/kg/min via INTRAVENOUS

## 2024-02-11 MED ORDER — PROPOFOL 10 MG/ML IV BOLUS
INTRAVENOUS | Status: DC | PRN
  Administered 2024-02-11: 10 mg via INTRAVENOUS
  Administered 2024-02-11: 30 mg via INTRAVENOUS

## 2024-02-11 MED ORDER — PANTOPRAZOLE SODIUM 40 MG IV SOLR
40.0000 mg | INTRAVENOUS | Status: DC
  Administered 2024-02-11: 40 mg via INTRAVENOUS
  Filled 2024-02-11: qty 10

## 2024-02-11 MED ORDER — LOSARTAN POTASSIUM 50 MG PO TABS
25.0000 mg | ORAL_TABLET | Freq: Every day | ORAL | Status: DC
  Administered 2024-02-11: 25 mg via ORAL
  Filled 2024-02-11: qty 1

## 2024-02-11 MED ORDER — POTASSIUM CHLORIDE CRYS ER 20 MEQ PO TBCR
40.0000 meq | EXTENDED_RELEASE_TABLET | Freq: Once | ORAL | Status: AC
  Administered 2024-02-11: 40 meq via ORAL
  Filled 2024-02-11: qty 2

## 2024-02-11 MED ORDER — HYDRALAZINE HCL 20 MG/ML IJ SOLN
10.0000 mg | Freq: Four times a day (QID) | INTRAMUSCULAR | Status: DC | PRN
  Administered 2024-02-11 – 2024-02-12 (×3): 10 mg via INTRAVENOUS
  Filled 2024-02-11 (×3): qty 1

## 2024-02-11 MED ORDER — DILTIAZEM HCL 60 MG PO TABS
60.0000 mg | ORAL_TABLET | Freq: Four times a day (QID) | ORAL | Status: DC
  Administered 2024-02-11 – 2024-02-12 (×4): 60 mg via ORAL
  Filled 2024-02-11 (×4): qty 1

## 2024-02-11 MED ORDER — POTASSIUM CHLORIDE CRYS ER 20 MEQ PO TBCR
20.0000 meq | EXTENDED_RELEASE_TABLET | Freq: Once | ORAL | Status: AC
  Administered 2024-02-11: 20 meq via ORAL
  Filled 2024-02-11: qty 1

## 2024-02-11 MED ORDER — MAGNESIUM SULFATE 2 GM/50ML IV SOLN
2.0000 g | Freq: Once | INTRAVENOUS | Status: AC
  Administered 2024-02-11: 2 g via INTRAVENOUS
  Filled 2024-02-11: qty 50

## 2024-02-11 MED ORDER — PERFLUTREN LIPID MICROSPHERE
1.0000 mL | INTRAVENOUS | Status: AC | PRN
  Administered 2024-02-11: 3 mL via INTRAVENOUS

## 2024-02-11 MED ORDER — LOSARTAN POTASSIUM 50 MG PO TABS
100.0000 mg | ORAL_TABLET | Freq: Every day | ORAL | Status: DC
  Administered 2024-02-12 – 2024-02-16 (×5): 100 mg via ORAL
  Filled 2024-02-11 (×5): qty 2

## 2024-02-11 NOTE — Plan of Care (Signed)
  Problem: Education: Goal: Understanding of cardiac disease, CV risk reduction, and recovery process will improve Outcome: Progressing Goal: Individualized Educational Video(s) Outcome: Progressing   Problem: Activity: Goal: Ability to tolerate increased activity will improve Outcome: Progressing   

## 2024-02-11 NOTE — Anesthesia Procedure Notes (Signed)
 Procedure Name: MAC Date/Time: 02/11/2024 12:17 PM  Performed by: Metta Andrea NOVAK, CRNAPre-anesthesia Checklist: Patient identified, Emergency Drugs available, Suction available, Patient being monitored and Timeout performed Oxygen Delivery Method: Simple face mask Placement Confirmation: positive ETCO2

## 2024-02-11 NOTE — Anesthesia Preprocedure Evaluation (Signed)
 Anesthesia Evaluation  Patient identified by MRN, date of birth, ID band Patient awake    Reviewed: Allergy & Precautions, NPO status , Patient's Chart, lab work & pertinent test results  Airway Mallampati: III  TM Distance: >3 FB Neck ROM: Full    Dental   Pulmonary neg pulmonary ROS   breath sounds clear to auscultation       Cardiovascular hypertension, Pt. on medications + dysrhythmias Atrial Fibrillation  Rhythm:Regular Rate:Normal     Neuro/Psych       Dementia negative neurological ROS     GI/Hepatic Neg liver ROS,,,GI bleed   Endo/Other  diabetes    Renal/GU Renal InsufficiencyRenal disease     Musculoskeletal   Abdominal   Peds  Hematology negative hematology ROS (+)   Anesthesia Other Findings   Reproductive/Obstetrics                              Anesthesia Physical Anesthesia Plan  ASA: 3  Anesthesia Plan: MAC   Post-op Pain Management:    Induction:   PONV Risk Score and Plan: 1 and Propofol infusion, Ondansetron  and Treatment may vary due to age or medical condition  Airway Management Planned: Natural Airway and Nasal Cannula  Additional Equipment:   Intra-op Plan:   Post-operative Plan:   Informed Consent: I have reviewed the patients History and Physical, chart, labs and discussed the procedure including the risks, benefits and alternatives for the proposed anesthesia with the patient or authorized representative who has indicated his/her understanding and acceptance.       Plan Discussed with:   Anesthesia Plan Comments:         Anesthesia Quick Evaluation

## 2024-02-11 NOTE — Progress Notes (Signed)
 PROGRESS NOTE    Stanley Watkins  FMW:[id number] DOB: [birth year] DOA: 02/10/2024 PCP: Shelda Atlas, MD    Chief Complaint  Patient presents with   Emesis    Brief Narrative:  Patient 73 year old gentleman history of dementia, depression, hypertension, vitamin D  deficiency, prediabetic, schizoaffective disorder presented to the ED from urgent care with nausea vomiting diarrhea.  Patient also noted to have dark black vomitus, no bright red blood visible on exam.  No history of NSAID use.  Patient admitted due to concerns for upper GI bleed, gastroenteritis.  During the initial hospitalization patient noted to go into A-fib placed on the Cardizem drip.  Cardiology consulted.  GI consulted.  Patient underwent upper endoscopy 02/11/2024.   Assessment & Plan:   Principal Problem:   GIB (gastrointestinal bleeding) Active Problems:   Atrial fibrillation with rapid ventricular response (HCC)   Schizoaffective disorder (HCC)   AKI (acute kidney injury)   Type 2 diabetes mellitus (HCC)   Depression   Essential hypertension   Dementia without behavioral disturbance (HCC)   Nausea & vomiting   PAF (paroxysmal atrial fibrillation) (HCC)  #1 concern for upper GI bleed/nausea,??  Melena - Patient noted to have presented with black vomitus, nausea, noted to have some diarrhea that was dark in color. - Considered admission was for upper GI bleed. - Hemoglobin on admission noted at 16.1 which was felt to be secondary to hemoconcentration. - Repeat hemoglobin of 14.2 today. - Patient denies any further nausea vomiting or diarrhea. - Stool studies ordered and pending. - Patient seen in consultation by GI, underwent upper endoscopy this morning 02/11/2024 which noted a 5 cm hiatal hernia, normal esophagus, normal stomach biopsy, normal duodenum. - Continue IV PPI. - Follow H&H. - Appreciate GI input and recommendations.  2.  Probable gastroenteritis -Patient noted to have presented  nausea vomiting diarrhea felt likely secondary to a gastroenteritis. - Patient with clinical improvement with no further nausea vomiting or diarrhea. - GI pathogen panel pending, C. difficile PCR pending. - Abortive care.  3.  New onset atrial fibrillation -Likely secondary to problems #1 and 2. - TSH noted at 2.540. - 2D echo ordered and pending. - Patient seen in consultation by cardiology and patient placed on a Cardizem drip. - Patient back in normal sinus rhythm this morning with multiple PVCs. - Patient being followed by cardiology and being transition to oral Cardizem. -Keep potassium approximately 4, magnesium  approximately 2. - Anticoagulation held due to concern for upper GI bleed. - Per cardiology.  4.  AKI -Likely secondary to prerenal azotemia secondary to problems #1 and 2 in the setting of losartan-HCTZ. -Hold losartan, HCTZ. - Improved with hydration.  5.  Hypophosphatemia -Repeat phosphorus levels and replete.  6.  Schizoaffective disorder -Continue Neurontin. - Per med rec patient was on Wellbutrin and currently not taking. - Outpatient follow-up.  7.  Dementia without behavioral disturbance -Continue Aricept.  8.  Hypertension -Currently on Cardizem drip. - Patient noted to be on Norvasc, losartan, HCTZ. - BP trending up. - Start losartan 25 mg daily.  9.  Depression -Per med rec patient was on Wellbutrin but currently not taking. - Follow.  10.  Diabetes mellitus type 2 -Hemoglobin A1c 7.2. - Patient not on any hypoglycemic agents prior to admission. - CBG noted at 128 this morning. - SSI.   DVT prophylaxis: SCDs Code Status: Full Family Communication: Updated patient.  No family at bedside. Disposition: TBD  Status is: Inpatient Remains inpatient appropriate because: Severity of  illness   Consultants:  Cardiology: Dr. Mona 02/10/2024 GI: Dr. Saintclair 02/10/2024  Procedures:  2D echo 02/11/2024 Upper endoscopy 02/11/2024 per Dr.  Saintclair  Antimicrobials:  Anti-infectives (From admission, onward)    None         Subjective: Patient laying in bed.  Awake.  Denies any chest pain or shortness of breath.  Denies any further nausea or vomiting.  Denies any diarrhea.  Patient denies any bloody stools.  Denies any palpitations.  Overall feeling better than he did on admission.  Telemetry noted with patient in sinus rhythm with frequent PVCs.  Objective: Vitals:   02/11/24 1235 02/11/24 1240 02/11/24 1250 02/11/24 1307  BP: (!) 164/108 (!) 151/104 (!) 156/113 (!) 148/96  Pulse: 93 95 93 91  Resp: 18 (!) 23 (!) 25 16  Temp:   97.9 F (36.6 C)   TempSrc:      SpO2: 99% 99% 96% 93%  Weight:      Height:        Intake/Output Summary (Last 24 hours) at 02/11/2024 1425 Last data filed at 02/11/2024 1234 Gross per 24 hour  Intake 4094.74 ml  Output --  Net 4094.74 ml   Filed Weights   02/10/24 1112 02/10/24 1844 02/11/24 1127  Weight: 131.5 kg 126.5 kg 126.5 kg    Examination:  General exam: Appears calm and comfortable  Respiratory system: Clear to auscultation.  No wheezes, no crackles, no rhonchi.  Fair air movement.  Speaking in full sentences.  Respiratory effort normal. Cardiovascular system: S1 & S2 heard, RRR. No JVD, murmurs, rubs, gallops or clicks. No pedal edema. Gastrointestinal system: Abdomen is nondistended, soft and nontender. No organomegaly or masses felt. Normal bowel sounds heard. Central nervous system: Alert and oriented. No focal neurological deficits. Extremities: Symmetric 5 x 5 power. Skin: No rashes, lesions or ulcers Psychiatry: Judgement and insight appear normal. Mood & affect appropriate.     Data Reviewed: I have personally reviewed following labs and imaging studies  CBC: Recent Labs  Lab 02/10/24 1214 02/11/24 0341  WBC 4.7 4.4  HGB 16.1 14.2  HCT 50.3 44.2  MCV 84.0 84.5  PLT 157 148*    Basic Metabolic Panel: Recent Labs  Lab 02/10/24 1327 02/10/24 1735  02/10/24 2337 02/11/24 0341  NA 139  --  138 136  K 4.3  --  3.7 3.9  CL 103  --  104 103  CO2 29  --  26 27  GLUCOSE 120*  --  123* 142*  BUN 17  --  15 14  CREATININE 1.39*  --  1.12 1.08  CALCIUM 8.8*  --  8.5* 8.9  MG  --  2.0 1.9  --   PHOS  --   --  2.0*  --     GFR: Estimated Creatinine Clearance: 85 mL/min (by C-G formula based on SCr of 1.08 mg/dL).  Liver Function Tests: Recent Labs  Lab 02/10/24 1327  AST 19  ALT 24  ALKPHOS 67  BILITOT 0.4  PROT 7.1  ALBUMIN 3.7    CBG: Recent Labs  Lab 02/10/24 1735 02/10/24 2136 02/11/24 0727 02/11/24 1302  GLUCAP 108* 119* 128* 114*     Recent Results (from the past 240 hours)  MRSA Next Gen by PCR, Nasal     Status: None   Collection Time: 02/10/24  7:19 PM   Specimen: Nasal Mucosa; Nasal Swab  Result Value Ref Range Status   MRSA by PCR Next Gen NOT DETECTED NOT  DETECTED Final    Comment: (NOTE) The GeneXpert MRSA Assay (FDA approved for NASAL specimens only), is one component of a comprehensive MRSA colonization surveillance program. It is not intended to diagnose MRSA infection nor to guide or monitor treatment for MRSA infections. Test performance is not FDA approved in patients less than 64 years old. Performed at [facility], [address] [place], [place] [address]          Radiology Studies: ECHOCARDIOGRAM COMPLETE Result Date: 02/11/2024    ECHOCARDIOGRAM REPORT   Patient Name:   TARIS GALINDO Date of Exam: 02/11/2024 Medical Rec #:  [id number]             Height:       71.0 in Accession #:    [id number]            Weight:       278.9 lb Date of Birth:  12/09/1950            BSA:          2.429 m Patient Age:    72 years              BP:           155/75 mmHg Patient Gender: M                     HR:           86 bpm. Exam Location:  Inpatient Procedure: 2D Echo, Cardiac Doppler, Color Doppler and Intracardiac            Opacification Agent (Both Spectral and Color  Flow Doppler were            utilized during procedure). Indications:    Atrial Fibrillation  History:        Patient has no prior history of Echocardiogram examinations.                 Arrythmias:Atrial Fibrillation; Risk Factors:Hypertension and                 Diabetes.  Sonographer:    Philomena Daring Referring Phys: [email or web address] KENNETH C HILTY  Sonographer Comments: Technically difficult study due to poor echo windows, suboptimal parasternal window and patient is obese. IMPRESSIONS  1. Left ventricular ejection fraction, by estimation, is 55 to 60%. The left ventricle has normal function. Left ventricular endocardial border not optimally defined to evaluate regional wall motion. Left ventricular diastolic parameters are consistent with Grade I diastolic dysfunction (impaired relaxation).  2. Right ventricular systolic function is normal. The right ventricular size is normal.  3. The mitral valve is normal in structure. No evidence of mitral valve regurgitation. No evidence of mitral stenosis.  4. The aortic valve was not well visualized. Aortic valve regurgitation is not visualized. No aortic stenosis is present. Comparison(s): No prior Echocardiogram. Technically difficult evaluation but overall normal EF. FINDINGS  Left Ventricle: Left ventricular ejection fraction, by estimation, is 55 to 60%. The left ventricle has normal function. Left ventricular endocardial border not optimally defined to evaluate regional wall motion. Definity contrast agent was given IV to delineate the left ventricular endocardial borders. The left ventricular internal cavity size was normal in size. There is no left ventricular hypertrophy. Left ventricular diastolic parameters are consistent with Grade I diastolic dysfunction (impaired relaxation). Right Ventricle: The right ventricular size is normal. No increase in right ventricular wall thickness. Right ventricular systolic function is normal. Left Atrium: Left  atrial size was normal in  size. Right Atrium: Right atrial size was normal in size. Pericardium: There is no evidence of pericardial effusion. Mitral Valve: The mitral valve is normal in structure. No evidence of mitral valve regurgitation. No evidence of mitral valve stenosis. Tricuspid Valve: The tricuspid valve is normal in structure. Tricuspid valve regurgitation is not demonstrated. No evidence of tricuspid stenosis. Aortic Valve: The aortic valve was not well visualized. Aortic valve regurgitation is not visualized. No aortic stenosis is present. Pulmonic Valve: The pulmonic valve was not well visualized. Pulmonic valve regurgitation is not visualized. No evidence of pulmonic stenosis. Aorta: The aortic root is normal in size and structure. Venous: The inferior vena cava was not well visualized. IAS/Shunts: No atrial level shunt detected by color flow Doppler.  LEFT VENTRICLE PLAX 2D LVIDd:         3.40 cm   Diastology LVIDs:         2.40 cm   LV e' medial:    4.46 cm/s LV PW:         1.10 cm   LV E/e' medial:  11.1 LV IVS:        1.10 cm   LV e' lateral:   6.96 cm/s LVOT diam:     2.00 cm   LV E/e' lateral: 7.1 LV SV:         66 LV SV Index:   27 LVOT Area:     3.14 cm  RIGHT VENTRICLE TAPSE (M-mode): 2.0 cm LEFT ATRIUM             Index        RIGHT ATRIUM           Index LA diam:        2.90 cm 1.19 cm/m   RA Area:     12.00 cm LA Vol (A2C):   21.4 ml 8.81 ml/m   RA Volume:   27.90 ml  11.49 ml/m LA Vol (A4C):   26.4 ml 10.87 ml/m LA Biplane Vol: 25.4 ml 10.46 ml/m  AORTIC VALVE LVOT Vmax:   110.00 cm/s LVOT Vmean:  74.100 cm/s LVOT VTI:    0.209 m MITRAL VALVE MV Area (PHT): 5.58 cm    SHUNTS MV Decel Time: 136 msec    Systemic VTI:  0.21 m MV E velocity: 49.30 cm/s  Systemic Diam: 2.00 cm MV A velocity: 83.10 cm/s MV E/A ratio:  0.59 Franck Azobou Tonleu Electronically signed by Joelle Cedars Tonleu Signature Date/Time: 02/11/2024/11:29:42 AM    Final         Scheduled Meds:  Chlorhexidine  Gluconate Cloth  6 each  Topical Daily   diltiazem   60 mg Oral Q6H   donepezil   10 mg Oral Daily   gabapentin   100 mg Oral TID   insulin  aspart  0-9 Units Subcutaneous TID WC   losartan   25 mg Oral Daily   pantoprazole  (PROTONIX ) IV  40 mg Intravenous Q24H   Continuous Infusions:  lactated ringers  Stopped (02/11/24 0835)     LOS: 1 day    Time spent: 40 minutes    Toribio Hummer, MD [facility]   To contact the attending provider between 7A-7P or the covering provider during after hours 7P-7A, please log into the web site [email or web address] and access using universal [place] password for that web site. If you do not have the password, please call the hospital operator.  02/11/2024, 2:25 PM

## 2024-02-11 NOTE — Progress Notes (Unsigned)
 Enrolled for [employer] to mail a ZIO XT long term holter monitor to the patients address on file.

## 2024-02-11 NOTE — Care Management Obs Status (Signed)
 MEDICARE OBSERVATION STATUS NOTIFICATION   Patient Details  Name: Stanley Watkins MRN: [id number] Date of Birth: 20-Jul-1950   Medicare Observation Status Notification Given:  Yes    Sonda Manuella Quill, RN 02/11/2024, 6:02 PM

## 2024-02-11 NOTE — Plan of Care (Incomplete)

## 2024-02-11 NOTE — Anesthesia Postprocedure Evaluation (Signed)
 Anesthesia Post Note  Patient: Stanley Watkins  Procedure(s) Performed: EGD (ESOPHAGOGASTRODUODENOSCOPY)     Patient location during evaluation: PACU Anesthesia Type: MAC Level of consciousness: awake and alert Pain management: pain level controlled Vital Signs Assessment: post-procedure vital signs reviewed and stable Respiratory status: spontaneous breathing, nonlabored ventilation, respiratory function stable and patient connected to nasal cannula oxygen Cardiovascular status: stable and blood pressure returned to baseline Postop Assessment: no apparent nausea or vomiting Anesthetic complications: no   No notable events documented.  Last Vitals:  Vitals:   02/11/24 1240 02/11/24 1250  BP: (!) 151/104 (!) 156/113  Pulse: 95 93  Resp: (!) 23 (!) 25  Temp:    SpO2: 99% 96%    Last Pain:  Vitals:   02/11/24 1250  TempSrc:   PainSc: 0-No pain                 Epifanio Lamar BRAVO

## 2024-02-11 NOTE — Interval H&P Note (Signed)
 History and Physical Interval Note: 73 year old male with hide vomitus, diarrhea, black stools for EGD with propofol.  02/11/2024 12:08 PM  Stanley Watkins  has presented today for EGD with propofol, with the diagnosis of black vomitus, nausea, diarrhea, dark stools.  The various methods of treatment have been discussed with the patient and family. After consideration of risks, benefits and other options for treatment, the patient has consented to  Procedure(s): EGD (ESOPHAGOGASTRODUODENOSCOPY) (N/A) as a surgical intervention.  The patient's history has been reviewed, patient examined, no change in status, stable for surgery.  I have reviewed the patient's chart and labs.  Questions were answered to the patient's satisfaction.     Estelita Manas

## 2024-02-11 NOTE — Transfer of Care (Signed)
 Immediate Anesthesia Transfer of Care Note  Patient: Stanley Watkins  Procedure(s) Performed: EGD (ESOPHAGOGASTRODUODENOSCOPY)  Patient Location: PACU  Anesthesia Type:MAC  Level of Consciousness: awake, drowsy, and patient cooperative  Airway & Oxygen Therapy: Patient Spontanous Breathing and Patient connected to face mask oxygen  Post-op Assessment: Report given to RN and Post -op Vital signs reviewed and stable  Post vital signs: Reviewed and stable  Last Vitals:  Vitals Value Taken Time  BP 164/108   Temp    Pulse 95 02/11/24 12:34  Resp 15 02/11/24 12:34  SpO2 98 % 02/11/24 12:34  Vitals shown include unfiled device data.  Last Pain:  Vitals:   02/11/24 1127  TempSrc: Temporal  PainSc: 0-No pain      Patients Stated Pain Goal: 0 (02/10/24 2145)  Complications: No notable events documented.

## 2024-02-11 NOTE — Care Management CC44 (Signed)
 Condition Code 44 Documentation Completed  Patient Details  Name: Stanley Watkins MRN: [id number] Date of Birth: [birth year]   Condition Code 44 given:  Yes Patient signature on Condition Code 44 notice:  Yes Documentation of 2 MD's agreement:  Yes Code 44 added to claim:  Yes    Sonda Manuella Quill, RN 02/11/2024, 6:02 PM

## 2024-02-11 NOTE — Op Note (Signed)
 [facility] Patient Name: Stanley Watkins Procedure Date: 02/11/2024 MRN: [id number] Attending MD: Estelita Manas , MD, [id number] Date of Birth: 1950/07/09 CSN: [id number] Age: 73 Admit Type: Inpatient Procedure:                Upper GI endoscopy Indications:              Coffee-ground emesis Providers:                Estelita Manas, MD, Darleene Bare, RN, Corky Czech,                            Technician, Andrea Richard CRNA Referring MD:             [facility] Medicines:                Monitored Anesthesia Care Complications:            No immediate complications. Estimated blood loss:                            Minimal. Estimated Blood Loss:     Estimated blood loss was minimal. Procedure:                Pre-Anesthesia Assessment:                           - Prior to the procedure, a History and Physical                            was performed, and patient medications and                            allergies were reviewed. The patient's tolerance of                            previous anesthesia was also reviewed. The risks                            and benefits of the procedure and the sedation                            options and risks were discussed with the patient.                            All questions were answered, and informed consent                            was obtained. Prior Anticoagulants: The patient has                            taken no anticoagulant or antiplatelet agents                            except for aspirin. ASA Grade Assessment: III - A  patient with severe systemic disease. After                            reviewing the risks and benefits, the patient was                            deemed in satisfactory condition to undergo the                            procedure.                           After obtaining informed consent, the endoscope was                            passed under direct vision.  Throughout the                            procedure, the patient's blood pressure, pulse, and                            oxygen saturations were monitored continuously. The                            [id number]-H190 ([id number]) Olympus endoscope was introduced                            through the mouth, and advanced to the second part                            of duodenum. The upper GI endoscopy was                            accomplished without difficulty. The patient                            tolerated the procedure well. Scope In: Scope Out: Findings:      The examined esophagus was normal.      The Z-line was regular and was found 38 cm from the incisors.      A 5 cm hiatal hernia was present.      The entire examined stomach was normal. Biopsies were taken with a cold       forceps for Helicobacter pylori testing.      The cardia and gastric fundus were normal on retroflexion.      The examined duodenum was normal. Impression:               - Normal esophagus.                           - Z-line regular, 38 cm from the incisors.                           - 5 cm hiatal hernia.                           -  Normal stomach. Biopsied.                           - Normal examined duodenum. Moderate Sedation:      Patient did not receive moderate sedation for this procedure, but       instead received monitored anesthesia care. Recommendation:           - Resume regular diet.                           - Continue present medications.                           - Await pathology results. Procedure Code(s):        --- Professional ---                           [phone], Esophagogastroduodenoscopy, flexible,                            transoral; with biopsy, single or multiple Diagnosis Code(s):        --- Professional ---                           K44.9, Diaphragmatic hernia without obstruction or                            gangrene                           K92.0, Hematemesis CPT copyright 2022  American Medical Association. All rights reserved. The codes documented in this report are preliminary and upon coder review may  be revised to meet current compliance requirements. Estelita Manas, MD 02/11/2024 12:30:52 PM This report has been signed electronically. Number of Addenda: 0

## 2024-02-11 NOTE — Progress Notes (Signed)
  Progress Note  Patient Name: Stanley Watkins Date of Encounter: 02/11/2024 [facility] Cardiologist: Stanley JAYSON Maxcy, MD   Interval Summary   Doing well today. Denied chest pain, shortness of breath, and palpitations.   Vital Signs Vitals:   02/11/24 0600 02/11/24 0700 02/11/24 0811 02/11/24 0854  BP: (!) 182/115 (!) 151/106 (!) 148/95 (!) 154/95  Pulse:  93 85 (!) 53  Resp: 12 (!) 21 15 17   Temp:  98.3 F (36.8 C)    TempSrc:  Oral    SpO2:  95% 90% 94%  Weight:      Height:        Intake/Output Summary (Last 24 hours) at 02/11/2024 0906 Last data filed at 02/11/2024 0506 Gross per 24 hour  Intake 3754.1 ml  Output --  Net 3754.1 ml      02/10/2024    6:44 PM 02/10/2024   11:12 AM 11/19/2015    1:30 AM  Last 3 Weights  Weight (lbs) 278 lb 14.1 oz 290 lb 193 lb  Weight (kg) 126.5 kg 131.543 kg 87.544 kg      Telemetry/ECG  Sinus rhythm with frequent PVCs, HR ~ 85 - Personally Reviewed  Physical Exam  GEN: No acute distress.   Neck: No JVD Cardiac: irregularly, irregular, no murmurs, rubs, or gallops.  Respiratory: Clear to auscultation bilaterally. GI: Soft, nontender, non-distended  MS: No edema  Assessment & Plan  Stanley Watkins is a 73 year old male who has a history of dementia, hypertension, schizoaffective disorder, and T2DM who presented to the ED for nausea, emesis, and diarrhea. He was admitted for suspected UGI bleed. He was found to be in AF RVR, new onset. He was given IV fluids and Iv lopressor. Cardiology consulted.   Atrial Fibrillation with episode of RVR Currently in sinus rhythm with frequent PVCs Chad Vas score 3 Anticoagulation not started with suspected UGI bleed. GI planning for endoscopy today. Echocardiogram pending  Convert IV diltiazem [was running at 56mL/hr] to oral diltiazem 60 mg q6h.  Continue to hold on anticoagulation as above.   Hypertension BP: 154/95 Continue cozaar 25 mg  CCB as above, will continue to  titrate   Per primary Suspected GI bleed Dementia Schizoaffective disorder    For questions or updates, please contact [facility] Please consult [email or web address] for contact info under       Signed, Stanley LOISE Salen, PA-C

## 2024-02-12 ENCOUNTER — Other Ambulatory Visit (HOSPITAL_COMMUNITY): Payer: Self-pay

## 2024-02-12 ENCOUNTER — Other Ambulatory Visit: Payer: Self-pay

## 2024-02-12 ENCOUNTER — Encounter (HOSPITAL_COMMUNITY): Payer: Self-pay | Admitting: Gastroenterology

## 2024-02-12 ENCOUNTER — Observation Stay (HOSPITAL_COMMUNITY)

## 2024-02-12 ENCOUNTER — Telehealth (HOSPITAL_COMMUNITY): Payer: Self-pay

## 2024-02-12 DIAGNOSIS — I4891 Unspecified atrial fibrillation: Secondary | ICD-10-CM | POA: Diagnosis not present

## 2024-02-12 DIAGNOSIS — F0390 Unspecified dementia without behavioral disturbance: Secondary | ICD-10-CM | POA: Diagnosis not present

## 2024-02-12 DIAGNOSIS — K921 Melena: Secondary | ICD-10-CM | POA: Diagnosis not present

## 2024-02-12 DIAGNOSIS — I480 Paroxysmal atrial fibrillation: Secondary | ICD-10-CM | POA: Diagnosis not present

## 2024-02-12 LAB — BASIC METABOLIC PANEL WITH GFR
Anion gap: 10 (ref 5–15)
BUN: 9 mg/dL (ref 8–23)
CO2: 24 mmol/L (ref 22–32)
Calcium: 9.1 mg/dL (ref 8.9–10.3)
Chloride: 103 mmol/L (ref 98–111)
Creatinine, Ser: 0.97 mg/dL (ref 0.61–1.24)
GFR, Estimated: >60 mL/min (ref >60)
Glucose, Bld: 124 mg/dL — ABNORMAL HIGH (ref 70–99)
Potassium: 3.7 mmol/L (ref 3.5–5.1)
Sodium: 136 mmol/L (ref 135–145)

## 2024-02-12 LAB — CBC
HCT: 45.3 % (ref 39.0–52.0)
Hemoglobin: 14.5 g/dL (ref 13.0–17.0)
MCH: 26.9 pg (ref 26.0–34.0)
MCHC: 32 g/dL (ref 30.0–36.0)
MCV: 84 fL (ref 80.0–100.0)
Platelets: 158 K/uL (ref 150–400)
RBC: 5.39 MIL/uL (ref 4.22–5.81)
RDW: 16.2 % — ABNORMAL HIGH (ref 11.5–15.5)
WBC: 4.2 K/uL (ref 4.0–10.5)
nRBC: 0 % (ref 0.0–0.2)

## 2024-02-12 LAB — GLUCOSE, CAPILLARY
Glucose-Capillary: 132 mg/dL — ABNORMAL HIGH (ref 70–99)
Glucose-Capillary: 131 mg/dL — ABNORMAL HIGH (ref 70–99)
Glucose-Capillary: 120 mg/dL — ABNORMAL HIGH (ref 70–99)
Glucose-Capillary: 127 mg/dL — ABNORMAL HIGH (ref 70–99)

## 2024-02-12 LAB — MAGNESIUM: Magnesium: 2.3 mg/dL (ref 1.7–2.4)

## 2024-02-12 LAB — SURGICAL PATHOLOGY

## 2024-02-12 MED ORDER — METOPROLOL TARTRATE 5 MG/5ML IV SOLN
5.0000 mg | Freq: Once | INTRAVENOUS | Status: AC
  Administered 2024-02-12: 5 mg via INTRAVENOUS
  Filled 2024-02-12: qty 5

## 2024-02-12 MED ORDER — ALUM & MAG HYDROXIDE-SIMETH 200-200-20 MG/5ML PO SUSP
30.0000 mL | ORAL | Status: DC | PRN
  Administered 2024-02-12 – 2024-02-15 (×3): 30 mL via ORAL
  Filled 2024-02-12 (×3): qty 30

## 2024-02-12 MED ORDER — APIXABAN 5 MG PO TABS
5.0000 mg | ORAL_TABLET | Freq: Two times a day (BID) | ORAL | Status: DC
  Administered 2024-02-12 – 2024-02-16 (×9): 5 mg via ORAL
  Filled 2024-02-12 (×9): qty 1

## 2024-02-12 MED ORDER — PANTOPRAZOLE SODIUM 40 MG PO TBEC
40.0000 mg | DELAYED_RELEASE_TABLET | Freq: Every day | ORAL | Status: DC
  Administered 2024-02-12 – 2024-02-16 (×5): 40 mg via ORAL
  Filled 2024-02-12 (×5): qty 1

## 2024-02-12 MED ORDER — K PHOS MONO-SOD PHOS DI & MONO 155-852-130 MG PO TABS
250.0000 mg | ORAL_TABLET | Freq: Two times a day (BID) | ORAL | Status: AC
  Administered 2024-02-12 – 2024-02-14 (×6): 250 mg via ORAL
  Filled 2024-02-12 (×6): qty 1

## 2024-02-12 MED ORDER — HYDROCHLOROTHIAZIDE 25 MG PO TABS
25.0000 mg | ORAL_TABLET | Freq: Every day | ORAL | Status: DC
  Administered 2024-02-12 – 2024-02-16 (×5): 25 mg via ORAL
  Filled 2024-02-12 (×5): qty 1

## 2024-02-12 MED ORDER — DILTIAZEM HCL ER COATED BEADS 180 MG PO CP24
360.0000 mg | ORAL_CAPSULE | Freq: Every day | ORAL | Status: DC
  Administered 2024-02-12 – 2024-02-16 (×5): 360 mg via ORAL
  Filled 2024-02-12 (×5): qty 2

## 2024-02-12 NOTE — Telephone Encounter (Signed)
 Pharmacy Patient Advocate Encounter  Insurance verification completed.    The patient is insured through [employer] which does not have drug coverage.   Ran test claim for Eliquis  5mg  tablet and the current 30 day cash price is $656.97.   This test claim was processed through [facility]- copay amounts may vary at other pharmacies due to pharmacy/plan contracts, or as the patient moves through the different stages of their insurance plan.

## 2024-02-12 NOTE — Plan of Care (Signed)
  Problem: Education: Goal: Understanding of cardiac disease, CV risk reduction, and recovery process will improve Outcome: Adequate for Discharge Goal: Individualized Educational Video(s) Outcome: Adequate for Discharge   Problem: Metabolic: Goal: Ability to maintain appropriate glucose levels will improve Outcome: Adequate for Discharge   Problem: Skin Integrity: Goal: Risk for impaired skin integrity will decrease Outcome: Adequate for Discharge   Problem: Education: Goal: Knowledge of General Education information will improve Description: Including pain rating scale, medication(s)/side effects and non-pharmacologic comfort measures Outcome: Adequate for Discharge   Problem: Health Behavior/Discharge Planning: Goal: Ability to manage health-related needs will improve Outcome: Adequate for Discharge

## 2024-02-12 NOTE — TOC Initial Note (Signed)
 Transition of Care [facility]) - Initial/Assessment Note    Patient Details  Name: Stanley Watkins MRN: [id number] Date of Birth: 1950-11-06  [facility] CM/SW Contact:    Jon ONEIDA Anon, RN Phone Number: 02/12/2024, 4:52 PM  Clinical Narrative:                 Pt is from home with spouse. RNCM met with pt at bedside and he states he states he lives at home with spouse. Pt denies any HH, DME or SDOH needs at this time. Family will provide transportation at time of DC. Awaiting PT/OT eval. IP CM will continue to follow for DC planning needs.     Expected Discharge Plan:  (TBD) Barriers to Discharge: Continued Medical Work up   Patient Goals and CMS Choice Patient states their goals for this hospitalization and ongoing recovery are:: Return home CMS Medicare.gov Compare Post Acute Care list provided to:: Patient Choice offered to / list presented to : Patient [place] ownership interest in [facility].provided to:: Patient    Expected Discharge Plan and Services In-house Referral: NA Discharge Planning Services: CM Consult Post Acute Care Choice: NA Living arrangements for the past 2 months: Single Family Home                 DME Arranged: N/A DME Agency: NA       HH Arranged: NA HH Agency: NA        Prior Living Arrangements/Services Living arrangements for the past 2 months: Single Family Home Lives with:: Spouse Patient language and need for interpreter reviewed:: Yes Do you feel safe going back to the place where you live?: Yes      Need for Family Participation in Patient Care: Yes (Comment) Care giver support system in place?: Yes (comment) Current home services: Other (comment) (None) Criminal Activity/Legal Involvement Pertinent to Current Situation/Hospitalization: No - Comment as needed  Activities of Daily Living   ADL Screening (condition at time of admission) Independently performs ADLs?: Yes (appropriate for  developmental age) Is the patient deaf or have difficulty hearing?: No Does the patient have difficulty seeing, even when wearing glasses/contacts?: No Does the patient have difficulty concentrating, remembering, or making decisions?: Yes  Permission Sought/Granted Permission sought to share information with : Family Supports Permission granted to share information with : Yes, Verbal Permission Granted  Share Information with NAME: Taras, Rask (Spouse)  [phone]           Emotional Assessment Appearance:: Appears stated age Attitude/Demeanor/Rapport: Engaged Affect (typically observed): Accepting, Appropriate Orientation: : Oriented to Self, Oriented to Place, Oriented to  Time, Oriented to Situation Alcohol / Substance Use: Not Applicable Psych Involvement: No (comment)  Admission diagnosis:  GIB (gastrointestinal bleeding) [K92.2] Upper GI bleed [K92.2] Patient Active Problem List   Diagnosis Date Noted   PAF (paroxysmal atrial fibrillation) (HCC) 02/11/2024   GIB (gastrointestinal bleeding) 02/10/2024   Nausea & vomiting 02/10/2024   Essential hypertension 02/10/2024   Dementia without behavioral disturbance (HCC) 02/10/2024   Atrial fibrillation with rapid ventricular response (HCC) 02/10/2024   Thrombocytopenia 11/20/2015   AKI (acute kidney injury) 11/19/2015   Hypernatremia 11/19/2015   Polycythemia 11/19/2015   Type 2 diabetes mellitus (HCC) 11/19/2015   Sinus tachycardia 11/19/2015   Protein-calorie malnutrition, severe 11/19/2015   Depression    Schizoaffective disorder (HCC)    PCP:  Shelda Atlas, MD Pharmacy:   [facility] [phone] - [place], [place] - [address] [place] [address] Phone: [phone] Fax: [phone]  DARRYLE LONG - [facility] [address] [place] [address] Phone: [phone] Fax: [phone]     Social Drivers of Health (SDOH) Social History: SDOH  Screenings   Food Insecurity: No Food Insecurity (02/10/2024)  Housing: Low Risk  (02/10/2024)  Transportation Needs: No Transportation Needs (02/10/2024)  Utilities: Not At Risk (02/10/2024)  Social Connections: Socially Isolated (02/10/2024)  Tobacco Use: Low Risk  (02/11/2024)   SDOH Interventions:     Readmission Risk Interventions    02/12/2024    4:50 PM  Readmission Risk Prevention Plan  Post Dischage Appt Complete  Medication Screening Complete  Transportation Screening Complete

## 2024-02-12 NOTE — Discharge Instructions (Addendum)

## 2024-02-12 NOTE — Progress Notes (Signed)
  Progress Note  Patient Name: Stanley Watkins Date of Encounter: 02/12/2024 [facility] Cardiologist: Vinie JAYSON Maxcy, MD   Interval Summary   No complaints.  EGD did not show any source of bleeding.  Unfortunately he has been in and out of atrial fibrillation and what appears to be PACs in a bigeminal pattern.  Hemoglobin has remained stable.  He is unaware of the frequent ACs or A-fib.  Vital Signs Vitals:   02/12/24 0800 02/12/24 0802 02/12/24 0900 02/12/24 1031  BP: (!) 172/80  (!) 165/59 (!) 159/74  Pulse: (!) 57  (!) 56   Resp: (!) 24  16 13   Temp:  98 F (36.7 C)    TempSrc:  Oral    SpO2: 92%  94% 95%  Weight:      Height:        Intake/Output Summary (Last 24 hours) at 02/12/2024 1040 Last data filed at 02/12/2024 0800 Gross per 24 hour  Intake 576.39 ml  Output 450 ml  Net 126.39 ml      02/11/2024   11:27 AM 02/10/2024    6:44 PM 02/10/2024   11:12 AM  Last 3 Weights  Weight (lbs) 278 lb 14.1 oz 278 lb 14.1 oz 290 lb  Weight (kg) 126.5 kg 126.5 kg 131.543 kg      Telemetry/ECG  Sinus rhythm with frequent PACs and bigeminy and paroxysmal atrial fibrillation -personally reviewed  Physical Exam  GEN: No acute distress.   Neck: No JVD Cardiac: irregularly, irregular, no murmurs, rubs, or gallops.  Respiratory: Clear to auscultation bilaterally. GI: Soft, nontender, non-distended  MS: No edema  Assessment & Plan  Stanley Watkins is a 73 year old male who has a history of dementia, hypertension, schizoaffective disorder, and T2DM who presented to the ED for nausea, emesis, and diarrhea. He was admitted for suspected UGI bleed. He was found to be in AF RVR, new onset. He was given IV fluids and Iv lopressor . Cardiology consulted.   Atrial Fibrillation with episode of RVR Overnight he is having paroxysmal atrial fibrillation as well as sinus rhythm with frequent PACs in a bigeminal pattern.  Blood pressure remains elevated. - Switch short acting  diltiazem  to Cardizem  CD 360 mg daily. - He continues to have recurrent paroxysmal A-fib.  No clear source of bleeding.  Would advise stopping aspirin and switch to Eliquis  5 mg twice daily.  We will arrange for an outpatient 2-week monitor.  Hypertension BP remains elevated Continue cozaar  25 mg  Switch to Cardizem  CD 360 mg daily  Per primary Suspected GI bleed Dementia Schizoaffective disorder    For questions or updates, please contact [facility] Please consult [email or web address] for contact info under   Vinie KYM Maxcy, MD, [facility], FNLA, FACP  [place]  [facility]  Medical Director of the Advanced Lipid Disorders &  Cardiovascular Risk Reduction Clinic Diplomate of the American Board of Clinical Lipidology Attending Cardiologist  Direct Dial: [phone]  Fax: [phone]  Website:  [email or web address]  Vinie JAYSON Maxcy, MD

## 2024-02-12 NOTE — Progress Notes (Signed)
   02/12/24 1020  Spiritual Encounters  Type of Visit Initial  Care provided to: Family  Referral source Chaplain assessment  Reason for visit Routine spiritual support  OnCall Visit No  Interventions  Spiritual Care Interventions Made Established relationship of care and support;Reflective listening;Explored values/beliefs/practices/strengths;Meaning making;Compassionate presence;Narrative/life review   While rounding on unit I met with Mrs Tayler Lassen. She welcomed my visit and spoke with me in hall while husband underwent xray in room.  Mrs Ventress engaged in some life review, processing both changes in Mr Schnitzler's health as well as her own She engaged in meaning making and reflection on role of her Sherlean faith in these circumstances. She also named their three sons and her faith community as additional sources of support.  I provided compassionate presence and both active and reflective listening. I affirmed faith, facilitated meaning making and invited sharing of ways Mrs Galdamez copes and understands challenges through lens of her faith as well as how she cares for self and others. Named experience of grief in Mr Chaudhary's cognitive changes and this may be an area for ongoing support. Will continue to follow.  Hammad Finkler L. Delores HERO.Div

## 2024-02-12 NOTE — Plan of Care (Signed)
   Problem: Nutritional: Goal: Maintenance of adequate nutrition will improve Outcome: Progressing   Problem: Education: Goal: Knowledge of General Education information will improve Description: Including pain rating scale, medication(s)/side effects and non-pharmacologic comfort measures Outcome: Progressing

## 2024-02-12 NOTE — Progress Notes (Signed)
 PROGRESS NOTE    Stanley Watkins  FMW:[id number] DOB: 1950-03-13 DOA: 02/10/2024 PCP: Shelda Atlas, MD    Chief Complaint  Patient presents with   Emesis    Brief Narrative:  Patient 73 year old gentleman history of dementia, depression, hypertension, vitamin D  deficiency, prediabetic, schizoaffective disorder presented to the ED from urgent care with nausea vomiting diarrhea.  Patient also noted to have dark black vomitus, no bright red blood visible on exam.  No history of NSAID use.  Patient admitted due to concerns for upper GI bleed, gastroenteritis.  During the initial hospitalization patient noted to go into A-fib placed on the Cardizem  drip.  Cardiology consulted.  GI consulted.  Patient underwent upper endoscopy 02/11/2024.   Assessment & Plan:   Principal Problem:   GIB (gastrointestinal bleeding) Active Problems:   Atrial fibrillation with rapid ventricular response (HCC)   Schizoaffective disorder (HCC)   AKI (acute kidney injury)   Type 2 diabetes mellitus (HCC)   Depression   Essential hypertension   Dementia without behavioral disturbance (HCC)   Nausea & vomiting   PAF (paroxysmal atrial fibrillation) (HCC)  #1 concern for upper GI bleed/nausea,??  Melena likely secondary to gastroenteritis - Patient noted to have presented with black vomitus, nausea, noted to have some diarrhea that was dark in color. - Considered admission was for upper GI bleed. - Hemoglobin on admission noted at 16.1 which was felt to be secondary to hemoconcentration. - Repeat hemoglobin of 14.5 today. - Patient denies any further nausea vomiting or diarrhea. - Stool studies ordered and pending. - Patient seen in consultation by GI, underwent upper endoscopy, 02/11/2024 which noted a 5 cm hiatal hernia, normal esophagus, normal stomach biopsy, normal duodenum. - Change IV PPI to oral PPI. - Follow H&H. - Appreciate GI input and recommendations.  2.  Probable  gastroenteritis -Patient noted to have presented nausea vomiting diarrhea felt likely secondary to a gastroenteritis. - Patient with clinical improvement with no further nausea vomiting or diarrhea. - GI pathogen panel pending, C. difficile PCR pending. - If no further nausea vomiting or diarrhea in the next 24 hours we will discontinue enteric precautions. - Supportive care.  3.  New onset atrial fibrillation -Likely secondary to problems #1 and 2. - TSH noted at 2.540. - 2D echo with EF of 55 to 60%, grade 1 DD. - Patient seen in consultation by cardiology and patient placed on a Cardizem  drip initially on admission. -Patient noted on 02/11/2024 to be back in normal sinus rhythm with multiple PVCs and patient transition to oral Cardizem  60 mg p.o. every 6 hours -Patient noted on telemetry to have multiple couplets, some occasional irregular rhythm. -Keep potassium approximately 4, magnesium  approximately 2. - Anticoagulation held due to concern for upper GI bleed. - Per cardiology.  4.  AKI -Likely secondary to prerenal azotemia secondary to problems #1 and 2 in the setting of losartan -HCTZ. - Renal function improved and AKI resolved  - losartan /HCTZ restarted.   - Monitor renal function closely.   5.  Hypophosphatemia - Phosphorus at 2.1  - K-Phos 250 mg p.o. twice daily x 3 days. - Repeat labs in AM.  6.  Schizoaffective disorder -Continue Neurontin . - Per med rec patient was on Wellbutrin  and currently not taking. - Outpatient follow-up.  7.  Dementia without behavioral disturbance - Aricept .   8.  Hypertension - Has been transition to Cardizem  60 mg p.o. every 6 hours. - Patient noted to be on Norvasc , losartan , HCTZ prior to admission. -  BP trending up. -Increase losartan  back to home dose of 100 mg daily. -Resume home regimen HCTZ 25 mg daily. -Follow.  9.  Depression -Per med rec patient was on Wellbutrin  but currently not taking. - Follow.  10.  Diabetes  mellitus type 2 -Hemoglobin A1c 7.2. - Patient not on any hypoglycemic agents prior to admission. - CBG noted at 132 this morning. - SSI. - Outpatient follow-up with PCP.   DVT prophylaxis: SCDs Code Status: Full Family Communication: Updated patient.  Updated wife at bedside.   Disposition: TBD  Status is: Inpatient Remains inpatient appropriate because: Severity of illness   Consultants:  Cardiology: Dr. Mona 02/10/2024 GI: Dr. Saintclair 02/10/2024  Procedures:  2D echo 02/11/2024 Upper endoscopy 02/11/2024 per Dr. Saintclair  Antimicrobials:  Anti-infectives (From admission, onward)    None         Subjective: Patient alert and awake.  Denies any chest pain or shortness of breath.  No abdominal pain.  No further nausea vomiting or diarrhea.  Wife at bedside.  Tolerating current diet.  Patient noted on telemetry to have some PVCs, some couplets noted and some irregular patterns.    Objective: Vitals:   02/12/24 0700 02/12/24 0800 02/12/24 0802 02/12/24 0900  BP: (!) 142/72 (!) 172/80  (!) 165/59  Pulse: (!) 51 (!) 57  (!) 56  Resp: 16 (!) 24  16  Temp:   98 F (36.7 C)   TempSrc:   Oral   SpO2: 91% 92%  94%  Weight:      Height:        Intake/Output Summary (Last 24 hours) at 02/12/2024 1020 Last data filed at 02/12/2024 0800 Gross per 24 hour  Intake 576.39 ml  Output 450 ml  Net 126.39 ml   Filed Weights   02/10/24 1112 02/10/24 1844 02/11/24 1127  Weight: 131.5 kg 126.5 kg 126.5 kg    Examination:  General exam: Appears calm and comfortable  Respiratory system: CTAB.  No wheezes, no crackles, no rhonchi.  Fair air movement.  Speaking in full sentences.  No use of accessory muscles of respiration.  Cardiovascular system: Irregular.  No JVD, no murmurs rubs or gallops.  No lower extremity edema.  Gastrointestinal system: Abdomen is soft, nontender, nondistended, positive bowel sounds.  No rebound.  No guarding.  Central nervous system: Alert and oriented. No  focal neurological deficits. Extremities: Symmetric 5 x 5 power. Skin: No rashes, lesions or ulcers Psychiatry: Judgement and insight appear poor to fair. Mood & affect appropriate.     Data Reviewed: I have personally reviewed following labs and imaging studies  CBC: Recent Labs  Lab 02/10/24 1214 02/11/24 0341 02/11/24 1513 02/12/24 0336  WBC 4.7 4.4 4.7 4.2  HGB 16.1 14.2 14.8 14.5  HCT 50.3 44.2 47.2 45.3  MCV 84.0 84.5 86.4 84.0  PLT 157 148* 119* 158    Basic Metabolic Panel: Recent Labs  Lab 02/10/24 1327 02/10/24 1735 02/10/24 2337 02/11/24 0341 02/12/24 0336  NA 139  --  138 136 136  K 4.3  --  3.7 3.9 3.7  CL 103  --  104 103 103  CO2 29  --  26 27 24   GLUCOSE 120*  --  123* 142* 124*  BUN 17  --  15 14 9   CREATININE 1.39*  --  1.12 1.08 0.97  CALCIUM 8.8*  --  8.5* 8.9 9.1  MG  --  2.0 1.9  --  2.3  PHOS  --   --  2.0* 2.1*  --     GFR: Estimated Creatinine Clearance: 94.6 mL/min (by C-G formula based on SCr of 0.97 mg/dL).  Liver Function Tests: Recent Labs  Lab 02/10/24 1327  AST 19  ALT 24  ALKPHOS 67  BILITOT 0.4  PROT 7.1  ALBUMIN 3.7    CBG: Recent Labs  Lab 02/11/24 0727 02/11/24 1302 02/11/24 1556 02/11/24 2214 02/12/24 0751  GLUCAP 128* 114* 141* 116* 132*     Recent Results (from the past 240 hours)  MRSA Next Gen by PCR, Nasal     Status: None   Collection Time: 02/10/24  7:19 PM   Specimen: Nasal Mucosa; Nasal Swab  Result Value Ref Range Status   MRSA by PCR Next Gen NOT DETECTED NOT DETECTED Final    Comment: (NOTE) The GeneXpert MRSA Assay (FDA approved for NASAL specimens only), is one component of a comprehensive MRSA colonization surveillance program. It is not intended to diagnose MRSA infection nor to guide or monitor treatment for MRSA infections. Test performance is not FDA approved in patients less than 34 years old. Performed at [facility], [address], [place], [place]  [address]          Radiology Studies: ECHOCARDIOGRAM COMPLETE Result Date: 02/11/2024    ECHOCARDIOGRAM REPORT   Patient Name:   JEMAINE PROKOP Date of Exam: 02/11/2024 Medical Rec #:  [id number]             Height:       71.0 in Accession #:    [id number]            Weight:       278.9 lb Date of Birth:  Mar 09, 1951            BSA:          2.429 m Patient Age:    72 years              BP:           155/75 mmHg Patient Gender: M                     HR:           86 bpm. Exam Location:  Inpatient Procedure: 2D Echo, Cardiac Doppler, Color Doppler and Intracardiac            Opacification Agent (Both Spectral and Color Flow Doppler were            utilized during procedure). Indications:    Atrial Fibrillation  History:        Patient has no prior history of Echocardiogram examinations.                 Arrythmias:Atrial Fibrillation; Risk Factors:Hypertension and                 Diabetes.  Sonographer:    Philomena Daring Referring Phys: BELLICUS.BORA KENNETH C HILTY  Sonographer Comments: Technically difficult study due to poor echo windows, suboptimal parasternal window and patient is obese. IMPRESSIONS  1. Left ventricular ejection fraction, by estimation, is 55 to 60%. The left ventricle has normal function. Left ventricular endocardial border not optimally defined to evaluate regional wall motion. Left ventricular diastolic parameters are consistent with Grade I diastolic dysfunction (impaired relaxation).  2. Right ventricular systolic function is normal. The right ventricular size is normal.  3. The mitral valve is normal in structure. No evidence of mitral valve regurgitation. No evidence of  mitral stenosis.  4. The aortic valve was not well visualized. Aortic valve regurgitation is not visualized. No aortic stenosis is present. Comparison(s): No prior Echocardiogram. Technically difficult evaluation but overall normal EF. FINDINGS  Left Ventricle: Left ventricular ejection fraction, by estimation, is 55 to  60%. The left ventricle has normal function. Left ventricular endocardial border not optimally defined to evaluate regional wall motion. Definity  contrast agent was given IV to delineate the left ventricular endocardial borders. The left ventricular internal cavity size was normal in size. There is no left ventricular hypertrophy. Left ventricular diastolic parameters are consistent with Grade I diastolic dysfunction (impaired relaxation). Right Ventricle: The right ventricular size is normal. No increase in right ventricular wall thickness. Right ventricular systolic function is normal. Left Atrium: Left atrial size was normal in size. Right Atrium: Right atrial size was normal in size. Pericardium: There is no evidence of pericardial effusion. Mitral Valve: The mitral valve is normal in structure. No evidence of mitral valve regurgitation. No evidence of mitral valve stenosis. Tricuspid Valve: The tricuspid valve is normal in structure. Tricuspid valve regurgitation is not demonstrated. No evidence of tricuspid stenosis. Aortic Valve: The aortic valve was not well visualized. Aortic valve regurgitation is not visualized. No aortic stenosis is present. Pulmonic Valve: The pulmonic valve was not well visualized. Pulmonic valve regurgitation is not visualized. No evidence of pulmonic stenosis. Aorta: The aortic root is normal in size and structure. Venous: The inferior vena cava was not well visualized. IAS/Shunts: No atrial level shunt detected by color flow Doppler.  LEFT VENTRICLE PLAX 2D LVIDd:         3.40 cm   Diastology LVIDs:         2.40 cm   LV e' medial:    4.46 cm/s LV PW:         1.10 cm   LV E/e' medial:  11.1 LV IVS:        1.10 cm   LV e' lateral:   6.96 cm/s LVOT diam:     2.00 cm   LV E/e' lateral: 7.1 LV SV:         66 LV SV Index:   27 LVOT Area:     3.14 cm  RIGHT VENTRICLE TAPSE (M-mode): 2.0 cm LEFT ATRIUM             Index        RIGHT ATRIUM           Index LA diam:        2.90 cm 1.19 cm/m    RA Area:     12.00 cm LA Vol (A2C):   21.4 ml 8.81 ml/m   RA Volume:   27.90 ml  11.49 ml/m LA Vol (A4C):   26.4 ml 10.87 ml/m LA Biplane Vol: 25.4 ml 10.46 ml/m  AORTIC VALVE LVOT Vmax:   110.00 cm/s LVOT Vmean:  74.100 cm/s LVOT VTI:    0.209 m MITRAL VALVE MV Area (PHT): 5.58 cm    SHUNTS MV Decel Time: 136 msec    Systemic VTI:  0.21 m MV E velocity: 49.30 cm/s  Systemic Diam: 2.00 cm MV A velocity: 83.10 cm/s MV E/A ratio:  0.59 Franck Azobou Tonleu Electronically signed by Joelle Cedars Tonleu Signature Date/Time: 02/11/2024/11:29:42 AM    Final         Scheduled Meds:  Chlorhexidine  Gluconate Cloth  6 each Topical Daily   diltiazem   60 mg Oral Q6H   donepezil   10 mg  Oral Daily   gabapentin   100 mg Oral TID   hydrochlorothiazide   25 mg Oral Daily   insulin  aspart  0-9 Units Subcutaneous TID WC   losartan   100 mg Oral Daily   pantoprazole  (PROTONIX ) IV  40 mg Intravenous Q24H   Continuous Infusions:     LOS: 1 day    Time spent: 40 minutes    Toribio Hummer, MD [facility]   To contact the attending provider between 7A-7P or the covering provider during after hours 7P-7A, please log into the web site [email or web address] and access using universal [place] password for that web site. If you do not have the password, please call the hospital operator.  02/12/2024, 10:20 AM

## 2024-02-13 DIAGNOSIS — I4891 Unspecified atrial fibrillation: Secondary | ICD-10-CM | POA: Diagnosis not present

## 2024-02-13 DIAGNOSIS — I480 Paroxysmal atrial fibrillation: Secondary | ICD-10-CM | POA: Diagnosis not present

## 2024-02-13 DIAGNOSIS — F0390 Unspecified dementia without behavioral disturbance: Secondary | ICD-10-CM | POA: Diagnosis not present

## 2024-02-13 DIAGNOSIS — K921 Melena: Secondary | ICD-10-CM | POA: Diagnosis not present

## 2024-02-13 LAB — BASIC METABOLIC PANEL WITH GFR
Anion gap: 11 (ref 5–15)
BUN: 11 mg/dL (ref 8–23)
CO2: 23 mmol/L (ref 22–32)
Calcium: 9.1 mg/dL (ref 8.9–10.3)
Chloride: 102 mmol/L (ref 98–111)
Creatinine, Ser: 1.18 mg/dL (ref 0.61–1.24)
GFR, Estimated: >60 mL/min (ref >60)
Glucose, Bld: 123 mg/dL — ABNORMAL HIGH (ref 70–99)
Potassium: 3.9 mmol/L (ref 3.5–5.1)
Sodium: 136 mmol/L (ref 135–145)

## 2024-02-13 LAB — GLUCOSE, CAPILLARY
Glucose-Capillary: 135 mg/dL — ABNORMAL HIGH (ref 70–99)
Glucose-Capillary: 119 mg/dL — ABNORMAL HIGH (ref 70–99)
Glucose-Capillary: 133 mg/dL — ABNORMAL HIGH (ref 70–99)
Glucose-Capillary: 132 mg/dL — ABNORMAL HIGH (ref 70–99)

## 2024-02-13 LAB — CBC
HCT: 48.5 % (ref 39.0–52.0)
Hemoglobin: 15.2 g/dL (ref 13.0–17.0)
MCH: 27 pg (ref 26.0–34.0)
MCHC: 31.3 g/dL (ref 30.0–36.0)
MCV: 86.1 fL (ref 80.0–100.0)
Platelets: 160 K/uL (ref 150–400)
RBC: 5.63 MIL/uL (ref 4.22–5.81)
RDW: 16.5 % — ABNORMAL HIGH (ref 11.5–15.5)
WBC: 5.6 K/uL (ref 4.0–10.5)
nRBC: 0 % (ref 0.0–0.2)

## 2024-02-13 LAB — C DIFFICILE QUICK SCREEN W PCR REFLEX
C Diff antigen: NEGATIVE
C Diff interpretation: NOT DETECTED
C Diff toxin: NEGATIVE

## 2024-02-13 NOTE — Plan of Care (Signed)
  Problem: Activity: Goal: Ability to tolerate increased activity will improve Outcome: Progressing   Problem: Education: Goal: Ability to describe self-care measures that may prevent or decrease complications (Diabetes Survival Skills Education) will improve Outcome: Progressing   Problem: Fluid Volume: Goal: Ability to maintain a balanced intake and output will improve Outcome: Progressing   Problem: Metabolic: Goal: Ability to maintain appropriate glucose levels will improve Outcome: Progressing   Problem: Nutritional: Goal: Maintenance of adequate nutrition will improve Outcome: Progressing   Problem: Clinical Measurements: Goal: Respiratory complications will improve Outcome: Progressing   Problem: Nutrition: Goal: Adequate nutrition will be maintained Outcome: Progressing   Problem: Coping: Goal: Level of anxiety will decrease Outcome: Progressing   Problem: Health Behavior/Discharge Planning: Goal: Ability to manage health-related needs will improve Outcome: Not Progressing   Problem: Nutritional: Goal: Progress toward achieving an optimal weight will improve Outcome: Not Progressing   Problem: Clinical Measurements: Goal: Ability to maintain clinical measurements within normal limits will improve Outcome: Not Progressing

## 2024-02-13 NOTE — Progress Notes (Signed)
 PROGRESS NOTE    Stanley Watkins  FMW:[id number] DOB: 1950/09/21 DOA: 02/10/2024 PCP: Shelda Atlas, MD    Chief Complaint  Patient presents with   Emesis    Brief Narrative:  Patient 73 year old gentleman history of dementia, depression, hypertension, vitamin D  deficiency, prediabetic, schizoaffective disorder presented to the ED from urgent care with nausea vomiting diarrhea.  Patient also noted to have dark black vomitus, no bright red blood visible on exam.  No history of NSAID use.  Patient admitted due to concerns for upper GI bleed, gastroenteritis.  During the initial hospitalization patient noted to go into A-fib placed on the Cardizem  drip.  Cardiology consulted.  GI consulted.  Patient underwent upper endoscopy 02/11/2024.   Assessment & Plan:   Principal Problem:   GIB (gastrointestinal bleeding) Active Problems:   Atrial fibrillation with rapid ventricular response (HCC)   Schizoaffective disorder (HCC)   AKI (acute kidney injury)   Type 2 diabetes mellitus (HCC)   Depression   Essential hypertension   Dementia without behavioral disturbance (HCC)   Nausea & vomiting   PAF (paroxysmal atrial fibrillation) (HCC)  #1 concern for upper GI bleed/nausea,??  Melena likely secondary to gastroenteritis - Patient noted to have presented with black vomitus, nausea, noted to have some diarrhea that was dark in color. - Considered admission was for upper GI bleed. - Hemoglobin on admission noted at 16.1 which was felt to be secondary to hemoconcentration. - Repeat hemoglobin of 15.2 today. - Patient denies any further nausea vomiting or diarrhea. - C. difficile PCR negative.   - GI pathogen panel pending.   - Patient seen in consultation by GI, underwent upper endoscopy, 02/11/2024 which noted a 5 cm hiatal hernia, normal esophagus, normal stomach biopsy, normal duodenum. - Continue oral PPI. - Follow H&H. - Appreciate GI input and recommendations.  2.  Probable  gastroenteritis -Patient noted to have presented nausea vomiting diarrhea felt likely secondary to a gastroenteritis. - Patient with clinical improvement with no further nausea vomiting or diarrhea. - GI pathogen panel pending, C. difficile PCR negative.  - If no further nausea vomiting or diarrhea in the next 24 hours and GI pathogen panel is negative, we will discontinue enteric precautions. - Supportive care.  3.  New onset atrial fibrillation -Likely secondary to problems #1 and 2. - TSH noted at 2.540. - 2D echo with EF of 55 to 60%, grade 1 DD. - Patient seen in consultation by cardiology and patient placed on a Cardizem  drip initially on admission. -Patient noted on 02/11/2024 to be back in normal sinus rhythm with multiple PVCs and patient transitioned to oral Cardizem  60 mg p.o. every 6 hours -Patient noted on telemetry to have multiple couplets, going in and out of atrial fibrillation.   - Cardizem  consolidated to Cardizem  CD 360 mg daily per cardiology.   - Patient noted yesterday evening to be going in and out of A-fib with some bouts of sustained elevated heart rates.   - Patient received Lopressor  5 mg IV x 1 yesterday evening for heart rates that was sustaining in the 140s to 160s with improvement.  -Continue Cardizem  CD 360 mg daily. -??  Addition of beta-blocker however will defer to cardiology. -Keep potassium approximately 4, magnesium  approximately 2. - Patient underwent upper endoscopy with no signs of bleeding, patient started on anticoagulation per cardiology on 02/12/2024 with Eliquis .   - Cost may be an issue with anticoagulation.   - TOC consulted for medication assistance.   - Per  cardiology.   4.  AKI -Likely secondary to prerenal azotemia secondary to problems #1 and 2 in the setting of losartan -HCTZ. - Renal function improved and AKI resolved  - losartan /HCTZ restarted.   - Renal function stable.   5.  Hypophosphatemia - Phosphorus at 2.1  - Continue  K-Phos 250 mg p.o. twice daily x 2 days. - Repeat labs in AM.  6.  Schizoaffective disorder -Continue Neurontin . - Per med rec patient was on Wellbutrin  and currently not taking. - Outpatient follow-up.  7.  Dementia without behavioral disturbance - Continue Aricept .   8.  Hypertension - Was on Cardizem  60 mg p.o. every 6 hours which has been consolidated per cardiology to Cardizem  CD 360 mg daily.  - Patient noted to be on Norvasc , losartan , HCTZ prior to admission. - BP improved on current regimen of losartan  100 mg daily, HCTZ 25 mg daily. -Follow.  9.  Depression -Per med rec patient was on Wellbutrin  but currently not taking. -Outpatient follow-up.  10.  Diabetes mellitus type 2 -Hemoglobin A1c 7.2. - Patient not on any hypoglycemic agents prior to admission. - CBG noted at 135 this morning. - SSI. - Outpatient follow-up with PCP.   DVT prophylaxis: SCDs Code Status: Full Family Communication: Updated patient.  Updated wife on the telephone.  Disposition: Likely home when clinically improved and stable and cleared by cardiology.   Status is: Inpatient Remains inpatient appropriate because: Severity of illness   Consultants:  Cardiology: Dr. Mona 02/10/2024 GI: Dr. Saintclair 02/10/2024  Procedures:  2D echo 02/11/2024 Upper endoscopy 02/11/2024 per Dr. Saintclair  Antimicrobials:  Anti-infectives (From admission, onward)    None         Subjective: Awake alert sitting up in bed eating breakfast.  Denies any chest pain or shortness of breath.  No abdominal pain.  No nausea or vomiting.  Denies any further diarrhea.  Noted on telemetry to be going in and out of A-fib overnight with some PVCs.  Currently in sinus rhythm.  Noted to have heart heart rate yesterday evening to be sustained in the 140s received a dose of IV Lopressor  with some improvement.      Objective: Vitals:   02/13/24 0100 02/13/24 0200 02/13/24 0300 02/13/24 0800  BP: (!) 149/60 (!) 153/50 (!)  147/93   Pulse: 73 (!) 54 94   Resp: 14 (!) 25 20   Temp:    98 F (36.7 C)  TempSrc:    Oral  SpO2: 93% 93% 95%   Weight:      Height:        Intake/Output Summary (Last 24 hours) at 02/13/2024 1000 Last data filed at 02/12/2024 2345 Gross per 24 hour  Intake 480 ml  Output 500 ml  Net -20 ml   Filed Weights   02/10/24 1112 02/10/24 1844 02/11/24 1127  Weight: 131.5 kg 126.5 kg 126.5 kg    Examination:  General exam: NAD Respiratory system: Lungs clear to auscultation bilaterally.  No wheezes, no crackles, no rhonchi.  Fair air movement.  Speaking in full sentences.  Cardiovascular system: Regular rate rhythm no murmurs rubs or gallops.  No JVD.  No lower extremity edema.   Gastrointestinal system: Abdomen is soft, nontender, nondistended, positive bowel sounds.  No rebound.  No guarding.  Central nervous system: Alert and oriented. No focal neurological deficits. Extremities: Symmetric 5 x 5 power. Skin: No rashes, lesions or ulcers Psychiatry: Judgement and insight appear poor to fair. Mood & affect appropriate.  Data Reviewed: I have personally reviewed following labs and imaging studies  CBC: Recent Labs  Lab 02/10/24 1214 02/11/24 0341 02/11/24 1513 02/12/24 0336 02/13/24 0253  WBC 4.7 4.4 4.7 4.2 5.6  HGB 16.1 14.2 14.8 14.5 15.2  HCT 50.3 44.2 47.2 45.3 48.5  MCV 84.0 84.5 86.4 84.0 86.1  PLT 157 148* 119* 158 160    Basic Metabolic Panel: Recent Labs  Lab 02/10/24 1327 02/10/24 1735 02/10/24 2337 02/11/24 0341 02/12/24 0336 02/13/24 0253  NA 139  --  138 136 136 136  K 4.3  --  3.7 3.9 3.7 3.9  CL 103  --  104 103 103 102  CO2 29  --  26 27 24 23   GLUCOSE 120*  --  123* 142* 124* 123*  BUN 17  --  15 14 9 11   CREATININE 1.39*  --  1.12 1.08 0.97 1.18  CALCIUM 8.8*  --  8.5* 8.9 9.1 9.1  MG  --  2.0 1.9  --  2.3  --   PHOS  --   --  2.0* 2.1*  --   --     GFR: Estimated Creatinine Clearance: 77.8 mL/min (by C-G formula based on SCr  of 1.18 mg/dL).  Liver Function Tests: Recent Labs  Lab 02/10/24 1327  AST 19  ALT 24  ALKPHOS 67  BILITOT 0.4  PROT 7.1  ALBUMIN 3.7    CBG: Recent Labs  Lab 02/12/24 0751 02/12/24 1137 02/12/24 1611 02/12/24 2142 02/13/24 0813  GLUCAP 132* 131* 120* 127* 135*     Recent Results (from the past 240 hours)  MRSA Next Gen by PCR, Nasal     Status: None   Collection Time: 02/10/24  7:19 PM   Specimen: Nasal Mucosa; Nasal Swab  Result Value Ref Range Status   MRSA by PCR Next Gen NOT DETECTED NOT DETECTED Final    Comment: (NOTE) The GeneXpert MRSA Assay (FDA approved for NASAL specimens only), is one component of a comprehensive MRSA colonization surveillance program. It is not intended to diagnose MRSA infection nor to guide or monitor treatment for MRSA infections. Test performance is not FDA approved in patients less than 33 years old. Performed at [facility], [address] [place], [place] [address]   C Difficile Quick Screen w PCR reflex     Status: None   Collection Time: 02/13/24  6:13 AM   Specimen: STOOL  Result Value Ref Range Status   C Diff antigen NEGATIVE NEGATIVE Final   C Diff toxin NEGATIVE NEGATIVE Final   C Diff interpretation No C. difficile detected.  Final    Comment: Performed at [facility], [address], [place], [place] [address]         Radiology Studies: DG CHEST PORT 1 VIEW Result Date: 02/12/2024 CLINICAL DATA:  Shortness of breath EXAM: PORTABLE CHEST 1 VIEW COMPARISON:  Chest radiograph dated 09/21/2014 FINDINGS: Low lung volumes with bronchovascular crowding. Bibasilar patchy and linear opacities. No pleural effusion or pneumothorax. The heart size and mediastinal contours are within normal limits. No acute osseous abnormality. IMPRESSION: Low lung volumes with bronchovascular crowding. Bibasilar patchy and linear opacities, likely atelectasis. Aspiration or pneumonia can be considered  in the appropriate clinical setting. Electronically Signed   By: Limin  Xu M.D.   On: 02/12/2024 13:54   ECHOCARDIOGRAM COMPLETE Result Date: 02/11/2024    ECHOCARDIOGRAM REPORT   Patient Name:   Stanley Watkins Date of Exam: 02/11/2024 Medical  Rec #:  [id number]             Height:       71.0 in Accession #:    [id number]            Weight:       278.9 lb Date of Birth:  April 05, 1950            BSA:          2.429 m Patient Age:    72 years              BP:           155/75 mmHg Patient Gender: M                     HR:           86 bpm. Exam Location:  Inpatient Procedure: 2D Echo, Cardiac Doppler, Color Doppler and Intracardiac            Opacification Agent (Both Spectral and Color Flow Doppler were            utilized during procedure). Indications:    Atrial Fibrillation  History:        Patient has no prior history of Echocardiogram examinations.                 Arrythmias:Atrial Fibrillation; Risk Factors:Hypertension and                 Diabetes.  Sonographer:    Philomena Daring Referring Phys: [email or web address] KENNETH C HILTY  Sonographer Comments: Technically difficult study due to poor echo windows, suboptimal parasternal window and patient is obese. IMPRESSIONS  1. Left ventricular ejection fraction, by estimation, is 55 to 60%. The left ventricle has normal function. Left ventricular endocardial border not optimally defined to evaluate regional wall motion. Left ventricular diastolic parameters are consistent with Grade I diastolic dysfunction (impaired relaxation).  2. Right ventricular systolic function is normal. The right ventricular size is normal.  3. The mitral valve is normal in structure. No evidence of mitral valve regurgitation. No evidence of mitral stenosis.  4. The aortic valve was not well visualized. Aortic valve regurgitation is not visualized. No aortic stenosis is present. Comparison(s): No prior Echocardiogram. Technically difficult evaluation but overall normal EF. FINDINGS  Left Ventricle:  Left ventricular ejection fraction, by estimation, is 55 to 60%. The left ventricle has normal function. Left ventricular endocardial border not optimally defined to evaluate regional wall motion. Definity  contrast agent was given IV to delineate the left ventricular endocardial borders. The left ventricular internal cavity size was normal in size. There is no left ventricular hypertrophy. Left ventricular diastolic parameters are consistent with Grade I diastolic dysfunction (impaired relaxation). Right Ventricle: The right ventricular size is normal. No increase in right ventricular wall thickness. Right ventricular systolic function is normal. Left Atrium: Left atrial size was normal in size. Right Atrium: Right atrial size was normal in size. Pericardium: There is no evidence of pericardial effusion. Mitral Valve: The mitral valve is normal in structure. No evidence of mitral valve regurgitation. No evidence of mitral valve stenosis. Tricuspid Valve: The tricuspid valve is normal in structure. Tricuspid valve regurgitation is not demonstrated. No evidence of tricuspid stenosis. Aortic Valve: The aortic valve was not well visualized. Aortic valve regurgitation is not visualized. No aortic stenosis is present. Pulmonic Valve: The pulmonic valve was not well visualized. Pulmonic valve regurgitation is not visualized. No evidence of pulmonic stenosis. Aorta:  The aortic root is normal in size and structure. Venous: The inferior vena cava was not well visualized. IAS/Shunts: No atrial level shunt detected by color flow Doppler.  LEFT VENTRICLE PLAX 2D LVIDd:         3.40 cm   Diastology LVIDs:         2.40 cm   LV e' medial:    4.46 cm/s LV PW:         1.10 cm   LV E/e' medial:  11.1 LV IVS:        1.10 cm   LV e' lateral:   6.96 cm/s LVOT diam:     2.00 cm   LV E/e' lateral: 7.1 LV SV:         66 LV SV Index:   27 LVOT Area:     3.14 cm  RIGHT VENTRICLE TAPSE (M-mode): 2.0 cm LEFT ATRIUM             Index         RIGHT ATRIUM           Index LA diam:        2.90 cm 1.19 cm/m   RA Area:     12.00 cm LA Vol (A2C):   21.4 ml 8.81 ml/m   RA Volume:   27.90 ml  11.49 ml/m LA Vol (A4C):   26.4 ml 10.87 ml/m LA Biplane Vol: 25.4 ml 10.46 ml/m  AORTIC VALVE LVOT Vmax:   110.00 cm/s LVOT Vmean:  74.100 cm/s LVOT VTI:    0.209 m MITRAL VALVE MV Area (PHT): 5.58 cm    SHUNTS MV Decel Time: 136 msec    Systemic VTI:  0.21 m MV E velocity: 49.30 cm/s  Systemic Diam: 2.00 cm MV A velocity: 83.10 cm/s MV E/A ratio:  0.59 Franck Azobou Tonleu Electronically signed by Joelle Cedars Tonleu Signature Date/Time: 02/11/2024/11:29:42 AM    Final         Scheduled Meds:  apixaban   5 mg Oral BID   Chlorhexidine  Gluconate Cloth  6 each Topical Daily   diltiazem   360 mg Oral Daily   donepezil   10 mg Oral Daily   gabapentin   100 mg Oral TID   hydrochlorothiazide   25 mg Oral Daily   insulin  aspart  0-9 Units Subcutaneous TID WC   losartan   100 mg Oral Daily   pantoprazole   40 mg Oral Daily   phosphorus  250 mg Oral BID   Continuous Infusions:     LOS: 1 day    Time spent: 40 minutes    Toribio Hummer, MD [facility]   To contact the attending provider between 7A-7P or the covering provider during after hours 7P-7A, please log into the web site [email or web address] and access using universal [place] password for that web site. If you do not have the password, please call the hospital operator.  02/13/2024, 10:00 AM

## 2024-02-13 NOTE — Evaluation (Signed)
 Physical Therapy Evaluation Patient Details Name: Stanley Watkins MRN: [id number] DOB: [birth year] Today's Date: 02/13/2024  History of Present Illness  Pt admitted from home with GIB and new onset afib with RVR.  Pt with hx of DM, dementia and schizoaffective disorder  Clinical Impression  Pt admitted as above and presenting with functional mobility limitations 2* ambulatory balance deficits and decreased activity tolerance.  This date, pt up to ambulate 150' in hall but requiring RW for stability.  Pt motivated and should progress to dc home with family assist.-      If plan is discharge home, recommend the following: A little help with walking and/or transfers;A little help with bathing/dressing/bathroom;Assistance with cooking/housework;Assist for transportation;Help with stairs or ramp for entrance   Can travel by private vehicle        Equipment Recommendations Other (comment) (likely no equipment needs)  Recommendations for Other Services       Functional Status Assessment Patient has had a recent decline in their functional status and demonstrates the ability to make significant improvements in function in a reasonable and predictable amount of time.     Precautions / Restrictions Precautions Precautions: Fall Restrictions Weight Bearing Restrictions Per Provider Order: No      Mobility  Bed Mobility Overal bed mobility: Needs Assistance Bed Mobility: Supine to Sit     Supine to sit: Contact guard     General bed mobility comments: for safety only    Transfers Overall transfer level: Needs assistance Equipment used: Rolling walker (2 wheels) Transfers: Sit to/from Stand Sit to Stand: Contact guard assist, From elevated surface           General transfer comment: Steady assist with cues for use of UEs to self assist    Ambulation/Gait Ambulation/Gait assistance: Min assist, Contact guard assist Gait Distance (Feet): 150 Feet Assistive device:  Rolling walker (2 wheels) Gait Pattern/deviations: Step-through pattern, Decreased step length - right, Decreased step length - left, Shuffle, Trunk flexed Gait velocity: decr     General Gait Details: cues for posture, position from RW and safety awareness  Stairs            Wheelchair Mobility     Tilt Bed    Modified Rankin (Stroke Patients Only)       Balance Overall balance assessment: Needs assistance Sitting-balance support: No upper extremity supported, Feet supported Sitting balance-Leahy Scale: Fair     Standing balance support: Bilateral upper extremity supported Standing balance-Leahy Scale: Poor                               Pertinent Vitals/Pain Pain Assessment Pain Assessment: No/denies pain    Home Living Family/patient expects to be discharged to:: Private residence Living Arrangements: Spouse/significant other Available Help at Discharge: Family;Available 24 hours/day Type of Home: House Home Access: Stairs to enter   [employer] of Steps: 1   Home Layout: One level Home Equipment: None      Prior Function Prior Level of Function : Independent/Modified Independent             Mobility Comments: No assistive device       Extremity/Trunk Assessment   Upper Extremity Assessment Upper Extremity Assessment: Overall WFL for tasks assessed    Lower Extremity Assessment Lower Extremity Assessment: Overall WFL for tasks assessed       Communication   Communication Communication: No apparent difficulties    Cognition Arousal: Alert  Behavior During Therapy: WFL for tasks assessed/performed, Flat affect   PT - Cognitive impairments: History of cognitive impairments, No family/caregiver present to determine baseline                       PT - Cognition Comments: Occassional delayed processing Following commands: Impaired Following commands impaired: Follows one step commands with increased  time     Cueing Cueing Techniques: Verbal cues, Gestural cues     General Comments      Exercises     Assessment/Plan    PT Assessment Patient needs continued PT services  PT Problem List Decreased strength;Decreased activity tolerance;Decreased balance;Decreased mobility;Decreased knowledge of use of DME       PT Treatment Interventions DME instruction;Stair training;Gait training;Functional mobility training;Therapeutic activities;Therapeutic exercise;Balance training;Patient/family education    PT Goals (Current goals can be found in the Care Plan section)  Acute Rehab PT Goals Patient Stated Goal: Regain IND PT Goal Formulation: With patient Time For Goal Achievement: 02/20/24 Potential to Achieve Goals: Good    Frequency Min 2X/week     Co-evaluation               AM-PAC PT 6 Clicks Mobility  Outcome Measure Help needed turning from your back to your side while in a flat bed without using bedrails?: A Little Help needed moving from lying on your back to sitting on the side of a flat bed without using bedrails?: A Little Help needed moving to and from a bed to a chair (including a wheelchair)?: A Little Help needed standing up from a chair using your arms (e.g., wheelchair or bedside chair)?: A Little Help needed to walk in hospital room?: A Little Help needed climbing 3-5 steps with a railing? : A Lot 6 Click Score: 17    End of Session Equipment Utilized During Treatment: Gait belt Activity Tolerance: Patient tolerated treatment well Patient left: in chair;with call bell/phone within reach;with chair alarm set Nurse Communication: Mobility status PT Visit Diagnosis: Unsteadiness on feet (R26.81);Difficulty in walking, not elsewhere classified (R26.2)    Time: 8888-8864 PT Time Calculation (min) (ACUTE ONLY): 24 min   Charges:   PT Evaluation $PT Eval Low Complexity: 1 Low PT Treatments $Gait Training: 8-22 mins PT General Charges $$ ACUTE PT  VISIT: 1 Visit         [facility] PT Acute Rehabilitation Services Office [phone]   Sereniti Wan 02/13/2024, 3:33 PM

## 2024-02-13 NOTE — Progress Notes (Signed)
 Progress Note  Patient Name: Stanley Watkins Date of Encounter: 02/13/2024  Primary Cardiologist: Vinie JAYSON Maxcy, MD   Subjective   Dyspnea is improved.   Inpatient Medications    Scheduled Meds:  apixaban   5 mg Oral BID   Chlorhexidine  Gluconate Cloth  6 each Topical Daily   diltiazem   360 mg Oral Daily   donepezil   10 mg Oral Daily   gabapentin   100 mg Oral TID   hydrochlorothiazide   25 mg Oral Daily   insulin  aspart  0-9 Units Subcutaneous TID WC   losartan   100 mg Oral Daily   pantoprazole   40 mg Oral Daily   phosphorus  250 mg Oral BID   Continuous Infusions:  PRN Meds: acetaminophen , alum & mag hydroxide-simeth, hydrALAZINE , ondansetron  (ZOFRAN ) IV, mouth rinse   Vital Signs    Vitals:   02/13/24 0100 02/13/24 0200 02/13/24 0300 02/13/24 0800  BP: (!) 149/60 (!) 153/50 (!) 147/93   Pulse: 73 (!) 54 94   Resp: 14 (!) 25 20   Temp:    98 F (36.7 C)  TempSrc:    Oral  SpO2: 93% 93% 95%   Weight:      Height:        Intake/Output Summary (Last 24 hours) at 02/13/2024 1135 Last data filed at 02/13/2024 1000 Gross per 24 hour  Intake 960 ml  Output 500 ml  Net 460 ml   Filed Weights   02/10/24 1112 02/10/24 1844 02/11/24 1127  Weight: 131.5 kg 126.5 kg 126.5 kg    Telemetry    Nsr with PAC's - Personally Reviewed  ECG    none - Personally Reviewed  Physical Exam   GEN: No acute distress.   Neck: No JVD Cardiac: RRR, no murmurs, rubs, or gallops.  Respiratory: Clear to auscultation bilaterally. GI: Soft, nontender, non-distended  MS: No edema; No deformity. Neuro:  Nonfocal  Psych: Normal affect   Labs    Chemistry Recent Labs  Lab 02/10/24 1327 02/10/24 2337 02/11/24 0341 02/12/24 0336 02/13/24 0253  NA 139   < > 136 136 136  K 4.3   < > 3.9 3.7 3.9  CL 103   < > 103 103 102  CO2 29   < > 27 24 23   GLUCOSE 120*   < > 142* 124* 123*  BUN 17   < > 14 9 11   CREATININE 1.39*   < > 1.08 0.97 1.18  CALCIUM 8.8*   < > 8.9  9.1 9.1  PROT 7.1  --   --   --   --   ALBUMIN 3.7  --   --   --   --   AST 19  --   --   --   --   ALT 24  --   --   --   --   ALKPHOS 67  --   --   --   --   BILITOT 0.4  --   --   --   --   GFRNONAA 54*   < > >60 >60 >60  ANIONGAP 7   < > 7 10 11    < > = values in this interval not displayed.     Hematology Recent Labs  Lab 02/11/24 1513 02/12/24 0336 02/13/24 0253  WBC 4.7 4.2 5.6  RBC 5.46 5.39 5.63  HGB 14.8 14.5 15.2  HCT 47.2 45.3 48.5  MCV 86.4 84.0 86.1  MCH 27.1 26.9 27.0  MCHC 31.4 32.0 31.3  RDW 16.4* 16.2* 16.5*  PLT 119* 158 160    Cardiac EnzymesNo results for input(s): TROPONINI in the last 168 hours. No results for input(s): TROPIPOC in the last 168 hours.   BNPNo results for input(s): BNP, PROBNP in the last 168 hours.   DDimer No results for input(s): DDIMER in the last 168 hours.   Radiology    DG CHEST PORT 1 VIEW Result Date: 02/12/2024 CLINICAL DATA:  Shortness of breath EXAM: PORTABLE CHEST 1 VIEW COMPARISON:  Chest radiograph dated 09/21/2014 FINDINGS: Low lung volumes with bronchovascular crowding. Bibasilar patchy and linear opacities. No pleural effusion or pneumothorax. The heart size and mediastinal contours are within normal limits. No acute osseous abnormality. IMPRESSION: Low lung volumes with bronchovascular crowding. Bibasilar patchy and linear opacities, likely atelectasis. Aspiration or pneumonia can be considered in the appropriate clinical setting. Electronically Signed   By: Limin  Xu M.D.   On: 02/12/2024 13:54    Cardiac Studies   none  Patient Profile     73 y.o. male admitted with atrial fib with a  RVR.   Assessment & Plan    Atrial fib with a RVR - he has been back in nsr and on eliquis .  HTN - Continue current meds. We will follow.    For questions or updates, please contact [facility] Please consult [email or web address] for contact info under Cardiology/STEMI.      Signed, Danelle Birmingham, MD   02/13/2024, 11:35 AM

## 2024-02-14 ENCOUNTER — Observation Stay (HOSPITAL_COMMUNITY)

## 2024-02-14 DIAGNOSIS — E119 Type 2 diabetes mellitus without complications: Secondary | ICD-10-CM | POA: Diagnosis present

## 2024-02-14 DIAGNOSIS — Z6841 Body Mass Index (BMI) 40.0 and over, adult: Secondary | ICD-10-CM | POA: Diagnosis not present

## 2024-02-14 DIAGNOSIS — Z818 Family history of other mental and behavioral disorders: Secondary | ICD-10-CM | POA: Diagnosis not present

## 2024-02-14 DIAGNOSIS — I10 Essential (primary) hypertension: Secondary | ICD-10-CM | POA: Diagnosis present

## 2024-02-14 DIAGNOSIS — K922 Gastrointestinal hemorrhage, unspecified: Secondary | ICD-10-CM | POA: Diagnosis present

## 2024-02-14 DIAGNOSIS — R112 Nausea with vomiting, unspecified: Secondary | ICD-10-CM | POA: Diagnosis not present

## 2024-02-14 DIAGNOSIS — Z79899 Other long term (current) drug therapy: Secondary | ICD-10-CM | POA: Diagnosis not present

## 2024-02-14 DIAGNOSIS — Z7901 Long term (current) use of anticoagulants: Secondary | ICD-10-CM | POA: Diagnosis not present

## 2024-02-14 DIAGNOSIS — Z7982 Long term (current) use of aspirin: Secondary | ICD-10-CM | POA: Diagnosis not present

## 2024-02-14 DIAGNOSIS — K449 Diaphragmatic hernia without obstruction or gangrene: Secondary | ICD-10-CM | POA: Diagnosis present

## 2024-02-14 DIAGNOSIS — F32A Depression, unspecified: Secondary | ICD-10-CM | POA: Diagnosis present

## 2024-02-14 DIAGNOSIS — R0902 Hypoxemia: Secondary | ICD-10-CM | POA: Diagnosis not present

## 2024-02-14 DIAGNOSIS — I480 Paroxysmal atrial fibrillation: Secondary | ICD-10-CM | POA: Diagnosis present

## 2024-02-14 DIAGNOSIS — I493 Ventricular premature depolarization: Secondary | ICD-10-CM | POA: Diagnosis present

## 2024-02-14 DIAGNOSIS — F259 Schizoaffective disorder, unspecified: Secondary | ICD-10-CM | POA: Diagnosis present

## 2024-02-14 DIAGNOSIS — K921 Melena: Secondary | ICD-10-CM | POA: Diagnosis not present

## 2024-02-14 DIAGNOSIS — A0811 Acute gastroenteropathy due to Norwalk agent: Secondary | ICD-10-CM | POA: Diagnosis present

## 2024-02-14 DIAGNOSIS — F0390 Unspecified dementia without behavioral disturbance: Secondary | ICD-10-CM | POA: Diagnosis not present

## 2024-02-14 DIAGNOSIS — K920 Hematemesis: Secondary | ICD-10-CM | POA: Diagnosis present

## 2024-02-14 DIAGNOSIS — G4733 Obstructive sleep apnea (adult) (pediatric): Secondary | ICD-10-CM | POA: Diagnosis present

## 2024-02-14 DIAGNOSIS — Z833 Family history of diabetes mellitus: Secondary | ICD-10-CM | POA: Diagnosis not present

## 2024-02-14 DIAGNOSIS — F0393 Unspecified dementia, unspecified severity, with mood disturbance: Secondary | ICD-10-CM | POA: Diagnosis present

## 2024-02-14 DIAGNOSIS — J690 Pneumonitis due to inhalation of food and vomit: Secondary | ICD-10-CM

## 2024-02-14 DIAGNOSIS — I4891 Unspecified atrial fibrillation: Secondary | ICD-10-CM | POA: Diagnosis not present

## 2024-02-14 DIAGNOSIS — N179 Acute kidney failure, unspecified: Secondary | ICD-10-CM | POA: Diagnosis present

## 2024-02-14 LAB — CBC WITH DIFFERENTIAL/PLATELET
Abs Immature Granulocytes: 0.02 K/uL (ref 0.00–0.07)
Basophils Absolute: 0 K/uL (ref 0.0–0.1)
Basophils Relative: 0 %
Eosinophils Absolute: 0 K/uL (ref 0.0–0.5)
Eosinophils Relative: 1 %
HCT: 49.2 % (ref 39.0–52.0)
Hemoglobin: 15.7 g/dL (ref 13.0–17.0)
Immature Granulocytes: 0 %
Lymphocytes Relative: 21 %
Lymphs Abs: 1.5 K/uL (ref 0.7–4.0)
MCH: 27.2 pg (ref 26.0–34.0)
MCHC: 31.9 g/dL (ref 30.0–36.0)
MCV: 85.1 fL (ref 80.0–100.0)
Monocytes Absolute: 0.9 K/uL (ref 0.1–1.0)
Monocytes Relative: 13 %
Neutro Abs: 4.4 K/uL (ref 1.7–7.7)
Neutrophils Relative %: 65 %
Platelets: 175 K/uL (ref 150–400)
RBC: 5.78 MIL/uL (ref 4.22–5.81)
RDW: 16.2 % — ABNORMAL HIGH (ref 11.5–15.5)
WBC: 6.9 K/uL (ref 4.0–10.5)
nRBC: 0 % (ref 0.0–0.2)

## 2024-02-14 LAB — GASTROINTESTINAL PANEL BY PCR, STOOL (REPLACES STOOL CULTURE)

## 2024-02-14 LAB — BASIC METABOLIC PANEL WITH GFR
Anion gap: 12 (ref 5–15)
BUN: 16 mg/dL (ref 8–23)
CO2: 23 mmol/L (ref 22–32)
Calcium: 9 mg/dL (ref 8.9–10.3)
Chloride: 101 mmol/L (ref 98–111)
Creatinine, Ser: 1.14 mg/dL (ref 0.61–1.24)
GFR, Estimated: >60 mL/min (ref >60)
Glucose, Bld: 121 mg/dL — ABNORMAL HIGH (ref 70–99)
Potassium: 4 mmol/L (ref 3.5–5.1)
Sodium: 135 mmol/L (ref 135–145)

## 2024-02-14 LAB — GLUCOSE, CAPILLARY
Glucose-Capillary: 138 mg/dL — ABNORMAL HIGH (ref 70–99)
Glucose-Capillary: 105 mg/dL — ABNORMAL HIGH (ref 70–99)
Glucose-Capillary: 138 mg/dL — ABNORMAL HIGH (ref 70–99)
Glucose-Capillary: 121 mg/dL — ABNORMAL HIGH (ref 70–99)

## 2024-02-14 LAB — PHOSPHORUS: Phosphorus: 3 mg/dL (ref 2.5–4.6)

## 2024-02-14 LAB — MAGNESIUM: Magnesium: 2.6 mg/dL — ABNORMAL HIGH (ref 1.7–2.4)

## 2024-02-14 MED ORDER — SODIUM CHLORIDE 0.9 % IV SOLN
3.0000 g | Freq: Four times a day (QID) | INTRAVENOUS | Status: DC
  Administered 2024-02-14 – 2024-02-16 (×8): 3 g via INTRAVENOUS
  Filled 2024-02-14 (×9): qty 8

## 2024-02-14 MED ORDER — METHOCARBAMOL 500 MG PO TABS
500.0000 mg | ORAL_TABLET | Freq: Once | ORAL | Status: AC
  Administered 2024-02-14: 500 mg via ORAL
  Filled 2024-02-14: qty 1

## 2024-02-14 MED ORDER — SODIUM CHLORIDE 0.9 % IV SOLN: INTRAVENOUS | Status: AC

## 2024-02-14 MED ORDER — METOPROLOL SUCCINATE ER 25 MG PO TB24
25.0000 mg | ORAL_TABLET | Freq: Every day | ORAL | Status: DC
  Administered 2024-02-14: 25 mg via ORAL
  Filled 2024-02-14: qty 1

## 2024-02-14 NOTE — TOC Progression Note (Signed)
 Transition of Care [facility]) - Progression Note    Patient Details  Name: RAJI GLINSKI MRN: [id number] Date of Birth: 12-02-1950  [facility] CM/SW Contact  Lorraine LILLETTE Fenton, LCSW Phone Number: 02/14/2024, 3:19 PM  Clinical Narrative:    PT recommends HHPT for pt, CSW visited pt at bedside, spouse and two others present, discussed HHPT recommendation. Pt and spouse in agreement with referral, no choice for provider, CSW agreed to send pt out to providers and if there is a match information will be included in DC material. ICM following.     Expected Discharge Plan:  (TBD) Barriers to Discharge: Continued Medical Work up               Expected Discharge Plan and Services In-house Referral: NA Discharge Planning Services: CM Consult Post Acute Care Choice: NA Living arrangements for the past 2 months: Single Family Home                 DME Arranged: N/A DME Agency: NA       HH Arranged: NA HH Agency: NA         Social Drivers of Health (SDOH) Interventions SDOH Screenings   Food Insecurity: No Food Insecurity (02/10/2024)  Housing: Low Risk  (02/10/2024)  Transportation Needs: No Transportation Needs (02/10/2024)  Utilities: Not At Risk (02/10/2024)  Social Connections: Socially Isolated (02/10/2024)  Tobacco Use: Low Risk  (02/11/2024)    Readmission Risk Interventions    02/12/2024    4:50 PM  Readmission Risk Prevention Plan  Post Dischage Appt Complete  Medication Screening Complete  Transportation Screening Complete

## 2024-02-14 NOTE — Progress Notes (Addendum)
 PROGRESS NOTE    Stanley Watkins  FMW:[id number] DOB: 1951-02-28 DOA: 02/10/2024 PCP: Shelda Atlas, MD    Chief Complaint  Patient presents with   Emesis    Brief Narrative:  Patient 73 year old gentleman history of dementia, depression, hypertension, vitamin D  deficiency, prediabetic, schizoaffective disorder presented to the ED from urgent care with nausea vomiting diarrhea.  Patient also noted to have dark black vomitus, no bright red blood visible on exam.  No history of NSAID use.  Patient admitted due to concerns for upper GI bleed, gastroenteritis.  During the initial hospitalization patient noted to go into A-fib placed on the Cardizem  drip.  Cardiology consulted.  GI consulted.  Patient underwent upper endoscopy 02/11/2024.   Assessment & Plan:   Principal Problem:   GIB (gastrointestinal bleeding) Active Problems:   Atrial fibrillation with rapid ventricular response (HCC)   Schizoaffective disorder (HCC)   AKI (acute kidney injury)   Type 2 diabetes mellitus (HCC)   Depression   Essential hypertension   Dementia without behavioral disturbance (HCC)   Nausea & vomiting   PAF (paroxysmal atrial fibrillation) (HCC)   Hypoxia   Aspiration pneumonia (HCC)  #1 concern for upper GI bleed/nausea,??  Melena likely secondary to gastroenteritis - Patient noted to have presented with black vomitus, nausea, noted to have some diarrhea that was dark in color. - Considered admission was for upper GI bleed. - Hemoglobin on admission noted at 16.1 which was felt to be secondary to hemoconcentration. - Repeat hemoglobin of 15.7 today. - Patient with no further nausea vomiting or diarrhea early on in the hospitalization however had some projectile vomiting early this morning, 02/14/2024. - C. difficile PCR negative.   - GI pathogen panel pending.   - Patient seen in consultation by GI, underwent upper endoscopy, 02/11/2024 which noted a 5 cm hiatal hernia, normal esophagus,  normal stomach biopsy, normal duodenum. - Continue oral PPI. - Follow H&H. - Appreciate GI input and recommendations.  2.  Probable gastroenteritis -Patient noted to have presented nausea vomiting diarrhea felt likely secondary to a gastroenteritis. - Patient with clinical improvement during the hospitalization until patient with projectile vomiting this morning, 02/14/2024 per RN with emesis noted to be brown/black.  Patient with 1 episode of diarrhea overnight per RN. - GI pathogen panel pending, C. difficile PCR negative.  -Check a chest x-ray, abdominal films. - Supportive care.  3.  Hypoxia/??  Aspiration pneumonia -Patient noted to be hypoxic this morning after having a bout of projectile vomiting. - Check a chest x-ray. - Place empirically on IV Unasyn .  4.  New onset atrial fibrillation -Likely secondary to problems #1 and 2. - TSH noted at 2.540. - 2D echo with EF of 55 to 60%, grade 1 DD. - Patient seen in consultation by cardiology and patient placed on a Cardizem  drip initially on admission. -Patient noted on 02/11/2024 to be back in normal sinus rhythm with multiple PVCs and patient transitioned to oral Cardizem  60 mg p.o. every 6 hours -Patient noted on telemetry to have multiple couplets, going in and out of atrial fibrillation the evening of 02/12/2024..   - Cardizem  consolidated to Cardizem  CD 360 mg daily per cardiology.   - Patient noted yesterday evening to be going in and out of A-fib with some bouts of sustained elevated heart rates.   - Patient received Lopressor  5 mg IV x 1 (02/12/2024) for heart rates that was sustaining in the 140s to 160s with improvement.  -Continue Cardizem  CD 360  mg daily. -??  Addition of beta-blocker however will defer to cardiology. -Keep potassium approximately 4, magnesium  approximately 2. - Patient underwent upper endoscopy with no signs of bleeding, patient started on anticoagulation per cardiology on 02/12/2024 with Eliquis .   - Cost  may be an issue with anticoagulation, per wife patient with a secondary insurance of [employer] and as such we will have reassess co-pay..   - TOC consulted for medication assistance.   - Per cardiology.   5.  AKI -Likely secondary to prerenal azotemia secondary to problems #1 and 2 in the setting of losartan -HCTZ. - Renal function improved and AKI resolved  - losartan /HCTZ restarted.   - Renal function stable.   6.  Hypophosphatemia - Phosphorus at 3.0. - Continue K-Phos 250 mg p.o. twice daily x 1 days. - Repeat labs in AM.  7.  Schizoaffective disorder -Continue Neurontin . - Per med rec patient was on Wellbutrin  and currently not taking. - Outpatient follow-up.  8.  Dementia without behavioral disturbance - Aricept .  9.  Hypertension - Was on Cardizem  60 mg p.o. every 6 hours which has been consolidated per cardiology to Cardizem  CD 360 mg daily.  - Patient noted to be on Norvasc , losartan , HCTZ prior to admission. - BP improved on current regimen of losartan  100 mg daily, HCTZ 25 mg daily, Cardizem  CD 360 daily. -Follow.  10.  Depression -Per med rec patient was on Wellbutrin  but currently not taking. -Outpatient follow-up.  11.  Diabetes mellitus type 2 -Hemoglobin A1c 7.2. - Patient not on any hypoglycemic agents prior to admission. - CBG noted at 138 this morning. - SSI. - Outpatient follow-up with PCP.   DVT prophylaxis: SCDs Code Status: Full Family Communication: Updated patient.  Updated wife on telephone.  Disposition: Likely home when clinically improved and stable and cleared by cardiology.   Status is: Inpatient Remains inpatient appropriate because: Severity of illness   Consultants:  Cardiology: Dr. Mona 02/10/2024 GI: Dr. Saintclair 02/10/2024  Procedures:  2D echo 02/11/2024 Upper endoscopy 02/11/2024 per Dr. Saintclair  Antimicrobials:  Anti-infectives (From admission, onward)    Start     Dose/Rate Route Frequency Ordered Stop   02/14/24 0900   Ampicillin -Sulbactam (UNASYN ) 3 g in sodium chloride  0.9 % 100 mL IVPB        3 g 200 mL/hr over 30 Minutes Intravenous Every 6 hours 02/14/24 0807           Subjective: Patient laying in bed.  Patient noted by RN to have projectile vomiting earlier on this morning.  Per RN patient stated felt better after vomiting.  Patient denies any chest pain or shortness of breath.  Patient noted to be hypoxic and subsequently placed on nasal cannula.  Patient in normal sinus rhythm.       Objective: Vitals:   02/14/24 0700 02/14/24 0800 02/14/24 0837 02/14/24 0900  BP:    139/87  Pulse: (!) 105   84  Resp: (!) 8   (!) 29  Temp:  98 F (36.7 C)    TempSrc:  Oral    SpO2: (!) 87%   97%  Weight:   123.1 kg   Height:        Intake/Output Summary (Last 24 hours) at 02/14/2024 0929 Last data filed at 02/14/2024 0743 Gross per 24 hour  Intake 480 ml  Output 525 ml  Net -45 ml   Filed Weights   02/10/24 1844 02/11/24 1127 02/14/24 0837  Weight: 126.5 kg 126.5 kg 123.1 kg  Examination:  General exam: NAD Respiratory system: CTAB.  No wheezes, no crackles, no rhonchi.  Some decreased breath sounds in the bases.  Fair air movement.  No use of accessory muscles of respiration.  Speaking in full sentences.  Cardiovascular system: RRR no murmurs rubs or gallops.  No JVD.  No pitting lower extremity edema.  Gastrointestinal system: Abdomen is soft, nontender, nondistended, positive bowel sounds.  No rebound.  No guarding.  Central nervous system: Alert and oriented. No focal neurological deficits. Extremities: Symmetric 5 x 5 power. Skin: No rashes, lesions or ulcers Psychiatry: Judgement and insight appear poor to fair. Mood & affect appropriate.     Data Reviewed: I have personally reviewed following labs and imaging studies  CBC: Recent Labs  Lab 02/11/24 0341 02/11/24 1513 02/12/24 0336 02/13/24 0253 02/14/24 0258  WBC 4.4 4.7 4.2 5.6 6.9  NEUTROABS  --   --   --   --  4.4   HGB 14.2 14.8 14.5 15.2 15.7  HCT 44.2 47.2 45.3 48.5 49.2  MCV 84.5 86.4 84.0 86.1 85.1  PLT 148* 119* 158 160 175    Basic Metabolic Panel: Recent Labs  Lab 02/10/24 1735 02/10/24 2337 02/11/24 0341 02/12/24 0336 02/13/24 0253 02/14/24 0258  NA  --  138 136 136 136 135  K  --  3.7 3.9 3.7 3.9 4.0  CL  --  104 103 103 102 101  CO2  --  26 27 24 23 23   GLUCOSE  --  123* 142* 124* 123* 121*  BUN  --  15 14 9 11 16   CREATININE  --  1.12 1.08 0.97 1.18 1.14  CALCIUM  --  8.5* 8.9 9.1 9.1 9.0  MG 2.0 1.9  --  2.3  --  2.6*  PHOS  --  2.0* 2.1*  --   --  3.0    GFR: Estimated Creatinine Clearance: 79.4 mL/min (by C-G formula based on SCr of 1.14 mg/dL).  Liver Function Tests: Recent Labs  Lab 02/10/24 1327  AST 19  ALT 24  ALKPHOS 67  BILITOT 0.4  PROT 7.1  ALBUMIN 3.7    CBG: Recent Labs  Lab 02/13/24 0813 02/13/24 1212 02/13/24 1609 02/13/24 2114 02/14/24 0811  GLUCAP 135* 119* 133* 132* 138*     Recent Results (from the past 240 hours)  MRSA Next Gen by PCR, Nasal     Status: None   Collection Time: 02/10/24  7:19 PM   Specimen: Nasal Mucosa; Nasal Swab  Result Value Ref Range Status   MRSA by PCR Next Gen NOT DETECTED NOT DETECTED Final    Comment: (NOTE) The GeneXpert MRSA Assay (FDA approved for NASAL specimens only), is one component of a comprehensive MRSA colonization surveillance program. It is not intended to diagnose MRSA infection nor to guide or monitor treatment for MRSA infections. Test performance is not FDA approved in patients less than 20 years old. Performed at [facility], [address], [place], [place] [address]   C Difficile Quick Screen w PCR reflex     Status: None   Collection Time: 02/13/24  6:13 AM   Specimen: STOOL  Result Value Ref Range Status   C Diff antigen NEGATIVE NEGATIVE Final   C Diff toxin NEGATIVE NEGATIVE Final   C Diff interpretation No C. difficile detected.  Final    Comment:  Performed at [facility], [address], [place], [place] [address]  Radiology Studies: DG CHEST PORT 1 VIEW Result Date: 02/12/2024 CLINICAL DATA:  Shortness of breath EXAM: PORTABLE CHEST 1 VIEW COMPARISON:  Chest radiograph dated 09/21/2014 FINDINGS: Low lung volumes with bronchovascular crowding. Bibasilar patchy and linear opacities. No pleural effusion or pneumothorax. The heart size and mediastinal contours are within normal limits. No acute osseous abnormality. IMPRESSION: Low lung volumes with bronchovascular crowding. Bibasilar patchy and linear opacities, likely atelectasis. Aspiration or pneumonia can be considered in the appropriate clinical setting. Electronically Signed   By: Limin  Xu M.D.   On: 02/12/2024 13:54        Scheduled Meds:  apixaban   5 mg Oral BID   Chlorhexidine  Gluconate Cloth  6 each Topical Daily   diltiazem   360 mg Oral Daily   donepezil   10 mg Oral Daily   gabapentin   100 mg Oral TID   hydrochlorothiazide   25 mg Oral Daily   insulin  aspart  0-9 Units Subcutaneous TID WC   losartan   100 mg Oral Daily   pantoprazole   40 mg Oral Daily   phosphorus  250 mg Oral BID   Continuous Infusions:  ampicillin -sulbactam (UNASYN ) IV        LOS: 1 day    Time spent: 40 minutes    Toribio Hummer, MD [facility]   To contact the attending provider between 7A-7P or the covering provider during after hours 7P-7A, please log into the web site [email or web address] and access using universal [place] password for that web site. If you do not have the password, please call the hospital operator.  02/14/2024, 9:29 AM

## 2024-02-14 NOTE — Progress Notes (Signed)
 Progress Note  Patient Name: Stanley Watkins Date of Encounter: 02/14/2024  Primary Cardiologist: Vinie JAYSON Maxcy, MD   Subjective   Somnolent. Episode of projectile vomiting noted.   Inpatient Medications    Scheduled Meds:  apixaban   5 mg Oral BID   Chlorhexidine  Gluconate Cloth  6 each Topical Daily   diltiazem   360 mg Oral Daily   donepezil   10 mg Oral Daily   gabapentin   100 mg Oral TID   hydrochlorothiazide   25 mg Oral Daily   insulin  aspart  0-9 Units Subcutaneous TID WC   losartan   100 mg Oral Daily   pantoprazole   40 mg Oral Daily   phosphorus  250 mg Oral BID   Continuous Infusions:  ampicillin -sulbactam (UNASYN ) IV     PRN Meds: acetaminophen , alum & mag hydroxide-simeth, hydrALAZINE , ondansetron  (ZOFRAN ) IV, mouth rinse   Vital Signs    Vitals:   02/14/24 0700 02/14/24 0800 02/14/24 0837 02/14/24 0900  BP:    139/87  Pulse: (!) 105   84  Resp: (!) 8   (!) 29  Temp:  98 F (36.7 C)    TempSrc:  Oral    SpO2: (!) 87%   97%  Weight:   123.1 kg   Height:        Intake/Output Summary (Last 24 hours) at 02/14/2024 0930 Last data filed at 02/14/2024 0743 Gross per 24 hour  Intake 480 ml  Output 525 ml  Net -45 ml   Filed Weights   02/10/24 1844 02/11/24 1127 02/14/24 0837  Weight: 126.5 kg 126.5 kg 123.1 kg    Telemetry    nsr - Personally Reviewed  ECG    none - Personally Reviewed  Physical Exam   GEN: somnolent, no acute distress.   Neck: No JVD Cardiac: RRR, no murmurs, rubs, or gallops.  Respiratory: Clear to auscultation bilaterally. GI: Soft, nontender, non-distended  MS: No edema; No deformity. Neuro:  Nonfocal  Psych: Normal affect   Labs    Chemistry Recent Labs  Lab 02/10/24 1327 02/10/24 2337 02/12/24 0336 02/13/24 0253 02/14/24 0258  NA 139   < > 136 136 135  K 4.3   < > 3.7 3.9 4.0  CL 103   < > 103 102 101  CO2 29   < > 24 23 23   GLUCOSE 120*   < > 124* 123* 121*  BUN 17   < > 9 11 16   CREATININE  1.39*   < > 0.97 1.18 1.14  CALCIUM 8.8*   < > 9.1 9.1 9.0  PROT 7.1  --   --   --   --   ALBUMIN 3.7  --   --   --   --   AST 19  --   --   --   --   ALT 24  --   --   --   --   ALKPHOS 67  --   --   --   --   BILITOT 0.4  --   --   --   --   GFRNONAA 54*   < > >60 >60 >60  ANIONGAP 7   < > 10 11 12    < > = values in this interval not displayed.     Hematology Recent Labs  Lab 02/12/24 0336 02/13/24 0253 02/14/24 0258  WBC 4.2 5.6 6.9  RBC 5.39 5.63 5.78  HGB 14.5 15.2 15.7  HCT 45.3 48.5 49.2  MCV  84.0 86.1 85.1  MCH 26.9 27.0 27.2  MCHC 32.0 31.3 31.9  RDW 16.2* 16.5* 16.2*  PLT 158 160 175    Cardiac EnzymesNo results for input(s): TROPONINI in the last 168 hours. No results for input(s): TROPIPOC in the last 168 hours.   BNPNo results for input(s): BNP, PROBNP in the last 168 hours.   DDimer No results for input(s): DDIMER in the last 168 hours.   Radiology    DG CHEST PORT 1 VIEW Result Date: 02/12/2024 CLINICAL DATA:  Shortness of breath EXAM: PORTABLE CHEST 1 VIEW COMPARISON:  Chest radiograph dated 09/21/2014 FINDINGS: Low lung volumes with bronchovascular crowding. Bibasilar patchy and linear opacities. No pleural effusion or pneumothorax. The heart size and mediastinal contours are within normal limits. No acute osseous abnormality. IMPRESSION: Low lung volumes with bronchovascular crowding. Bibasilar patchy and linear opacities, likely atelectasis. Aspiration or pneumonia can be considered in the appropriate clinical setting. Electronically Signed   By: Limin  Xu M.D.   On: 02/12/2024 13:54    Cardiac Studies   none  Patient Profile     73 y.o. male admitted with nausea,vomiting and diarrhea and atrial fib.   Assessment & Plan    Atrial fib with a CVR/RVR - he is back in NSR. Will increase his dose of beta blocker as bp and HR ok. HTN - see above.  Projectile vomiting - etiology unclear. Hopefully no obstruction.     For questions or  updates, please contact [facility] Please consult [email or web address] for contact info under Cardiology/STEMI.      Signed, Danelle Birmingham, MD  02/14/2024, 9:30 AM

## 2024-02-14 NOTE — Progress Notes (Addendum)
 Pt had large episode of projectile vomit around 0630 this morning. Emesis appears to be brown/black. Patient denies nausea at this time. Patient stated I feel much better since vomiting episode. RN made NP aware. Charge nurse made aware.

## 2024-02-15 ENCOUNTER — Other Ambulatory Visit (HOSPITAL_COMMUNITY): Payer: Self-pay

## 2024-02-15 DIAGNOSIS — A0811 Acute gastroenteropathy due to Norwalk agent: Secondary | ICD-10-CM

## 2024-02-15 LAB — GLUCOSE, CAPILLARY
Glucose-Capillary: 164 mg/dL — ABNORMAL HIGH (ref 70–99)
Glucose-Capillary: 105 mg/dL — ABNORMAL HIGH (ref 70–99)
Glucose-Capillary: 129 mg/dL — ABNORMAL HIGH (ref 70–99)

## 2024-02-15 LAB — CBC
HCT: 43.9 % (ref 39.0–52.0)
Hemoglobin: 13.7 g/dL (ref 13.0–17.0)
MCH: 27 pg (ref 26.0–34.0)
MCHC: 31.2 g/dL (ref 30.0–36.0)
MCV: 86.4 fL (ref 80.0–100.0)
Platelets: 167 K/uL (ref 150–400)
RBC: 5.08 MIL/uL (ref 4.22–5.81)
RDW: 16.4 % — ABNORMAL HIGH (ref 11.5–15.5)
WBC: 6.1 K/uL (ref 4.0–10.5)
nRBC: 0 % (ref 0.0–0.2)

## 2024-02-15 LAB — RENAL FUNCTION PANEL
Albumin: 3.6 g/dL (ref 3.5–5.0)
Anion gap: 8 (ref 5–15)
BUN: 21 mg/dL (ref 8–23)
CO2: 27 mmol/L (ref 22–32)
Calcium: 8.2 mg/dL — ABNORMAL LOW (ref 8.9–10.3)
Chloride: 103 mmol/L (ref 98–111)
Creatinine, Ser: 1.2 mg/dL (ref 0.61–1.24)
GFR, Estimated: >60 mL/min (ref >60)
Glucose, Bld: 114 mg/dL — ABNORMAL HIGH (ref 70–99)
Phosphorus: 3 mg/dL (ref 2.5–4.6)
Potassium: 3.8 mmol/L (ref 3.5–5.1)
Sodium: 138 mmol/L (ref 135–145)

## 2024-02-15 LAB — MAGNESIUM: Magnesium: 2.4 mg/dL (ref 1.7–2.4)

## 2024-02-15 MED ORDER — CARVEDILOL 6.25 MG PO TABS
6.2500 mg | ORAL_TABLET | Freq: Two times a day (BID) | ORAL | Status: DC
  Administered 2024-02-15 – 2024-02-16 (×4): 6.25 mg via ORAL
  Filled 2024-02-15 (×5): qty 1

## 2024-02-15 NOTE — Progress Notes (Signed)
SATURATION QUALIFICATIONS:   Patient Saturations on Room Air at Rest = 97%  Patient Saturations on Room Air while Ambulating = 92%

## 2024-02-15 NOTE — Evaluation (Signed)
 Occupational Therapy Evaluation Patient Details Name: Stanley Watkins MRN: [id number] DOB: 19-Sep-1950 Today's Date: 02/15/2024   History of Present Illness   Pt admitted from home with GIB and new onset afib with RVR.  Pt with hx of DM, dementia and schizoaffective disorder     Clinical Impressions PTA, pt lives with spouse, typically completely Independent with ADLs, IADLs, driving and mobility without AD. Pt presents now with minor deficits in dynamic standing balance and overall endurance. Pt able to trial hallway mobility without AD with CGA for safety though no overt LOB. Pt able to manage majority of ADLs with Setup-Supervision. Pt endorses adequate assist at home if needed. Will continue to follow acutely though anticipate no OT needs upon DC.  SpO2 95% at rest, 92% with activity on RA     If plan is discharge home, recommend the following:   Other (comment) (PRN)     Functional Status Assessment   Patient has had a recent decline in their functional status and demonstrates the ability to make significant improvements in function in a reasonable and predictable amount of time.     Equipment Recommendations   None recommended by OT     Recommendations for Other Services         Precautions/Restrictions   Precautions Precautions: Fall Precaution/Restrictions Comments: monitor O2 Restrictions Weight Bearing Restrictions Per Provider Order: No     Mobility Bed Mobility Overal bed mobility: Modified Independent Bed Mobility: Supine to Sit, Sit to Supine                Transfers Overall transfer level: Needs assistance Equipment used: None Transfers: Sit to/from Stand Sit to Stand: Supervision                  Balance Overall balance assessment: Needs assistance Sitting-balance support: No upper extremity supported, Feet supported Sitting balance-Leahy Scale: Good     Standing balance support: No upper extremity supported,  During functional activity Standing balance-Leahy Scale: Good Standing balance comment: Good-                           ADL either performed or assessed with clinical judgement   ADL Overall ADL's : Needs assistance/impaired Eating/Feeding: Independent   Grooming: Supervision/safety;Standing   Upper Body Bathing: Set up;Sitting   Lower Body Bathing: Supervison/ safety;Sitting/lateral leans;Sit to/from stand   Upper Body Dressing : Set up;Sitting   Lower Body Dressing: Supervision/safety;Sitting/lateral leans;Sit to/from stand;Minimal assistance Lower Body Dressing Details (indicate cue type and reason): Min A for socks, SPV for all other simulated dressing Toilet Transfer: Contact guard assist;Ambulation   Toileting- Clothing Manipulation and Hygiene: Supervision/safety;Sit to/from stand;Sitting/lateral lean       Functional mobility during ADLs: Contact guard assist General ADL Comments: Pt able to mobilize around unit without AD, mild unsteadiness but functional. SpO2 92% on RA with activity. Pt denied concerns managing ADls, reports wife can assist as needed     Vision Ability to See in Adequate Light: 0 Adequate Patient Visual Report: No change from baseline Vision Assessment?: No apparent visual deficits     Perception         Praxis         Pertinent Vitals/Pain Pain Assessment Pain Assessment: No/denies pain     Extremity/Trunk Assessment Upper Extremity Assessment Upper Extremity Assessment: Overall WFL for tasks assessed;Right hand dominant   Lower Extremity Assessment Lower Extremity Assessment: Defer to PT evaluation   Cervical /  Trunk Assessment Cervical / Trunk Assessment: Normal   Communication Communication Communication: No apparent difficulties   Cognition Arousal: Alert Behavior During Therapy: WFL for tasks assessed/performed, Flat affect Cognition: No apparent impairments             OT - Cognition Comments: minor  slower processing but overall functional                 Following commands: Impaired Following commands impaired: Follows one step commands with increased time, Follows multi-step commands with increased time     Cueing  General Comments   Cueing Techniques: Verbal cues;Gestural cues      Exercises     Shoulder Instructions      Home Living Family/patient expects to be discharged to:: Private residence Living Arrangements: Spouse/significant other Available Help at Discharge: Family;Available 24 hours/day Type of Home: House Home Access: Stairs to enter [employer] of Steps: 1   Home Layout: One level     Bathroom Shower/Tub: Producer, Television/film/video: Standard     Home Equipment: None          Prior Functioning/Environment Prior Level of Function : Independent/Modified Independent;Driving             Mobility Comments: No assistive device ADLs Comments: Indep with ADLs, IADLs, driving    OT Problem List: Decreased strength;Decreased activity tolerance;Impaired balance (sitting and/or standing);Cardiopulmonary status limiting activity   OT Treatment/Interventions: Therapeutic exercise;Self-care/ADL training;Energy conservation;DME and/or AE instruction;Therapeutic activities;Patient/family education;Balance training      OT Goals(Current goals can be found in the care plan section)   Acute Rehab OT Goals Patient Stated Goal: agreeable for OOB activity OT Goal Formulation: With patient Time For Goal Achievement: 02/29/24 Potential to Achieve Goals: Good   OT Frequency:  Min 2X/week    Co-evaluation              AM-PAC OT 6 Clicks Daily Activity     Outcome Measure Help from another person eating meals?: None Help from another person taking care of personal grooming?: A Little Help from another person toileting, which includes using toliet, bedpan, or urinal?: A Little Help from another person bathing (including  washing, rinsing, drying)?: A Little Help from another person to put on and taking off regular upper body clothing?: A Little Help from another person to put on and taking off regular lower body clothing?: A Little 6 Click Score: 19   End of Session Equipment Utilized During Treatment: Gait belt Nurse Communication: Mobility status;Other (comment) (O2)  Activity Tolerance: Patient tolerated treatment well Patient left: in bed;with bed alarm set;with call bell/phone within reach  OT Visit Diagnosis: Unsteadiness on feet (R26.81);Muscle weakness (generalized) (M62.81)                Time: 8686-8667 OT Time Calculation (min): 19 min Charges:  OT General Charges $OT Visit: 1 Visit OT Evaluation $OT Eval Moderate Complexity: 1 Mod  Mliss NOVAK, OTR/L Acute Rehab Services Office: [phone]   Mliss Fish 02/15/2024, 1:52 PM

## 2024-02-15 NOTE — Plan of Care (Signed)
  Problem: Education: Goal: Understanding of cardiac disease, CV risk reduction, and recovery process will improve Outcome: Progressing Goal: Individualized Educational Video(s) Outcome: Progressing   Problem: Coping: Goal: Ability to adjust to condition or change in health will improve Outcome: Progressing   Problem: Nutritional: Goal: Maintenance of adequate nutrition will improve Outcome: Progressing   Problem: Skin Integrity: Goal: Risk for impaired skin integrity will decrease Outcome: Progressing   Problem: Education: Goal: Knowledge of General Education information will improve Description: Including pain rating scale, medication(s)/side effects and non-pharmacologic comfort measures Outcome: Progressing   Problem: Activity: Goal: Risk for activity intolerance will decrease Outcome: Progressing   Problem: Nutrition: Goal: Adequate nutrition will be maintained Outcome: Progressing   Problem: Coping: Goal: Level of anxiety will decrease Outcome: Progressing   Problem: Elimination: Goal: Will not experience complications related to bowel motility Outcome: Progressing Goal: Will not experience complications related to urinary retention Outcome: Progressing   Problem: Pain Managment: Goal: General experience of comfort will improve and/or be controlled Outcome: Progressing   Problem: Safety: Goal: Ability to remain free from injury will improve Outcome: Progressing   Problem: Skin Integrity: Goal: Risk for impaired skin integrity will decrease Outcome: Progressing

## 2024-02-15 NOTE — Plan of Care (Signed)

## 2024-02-15 NOTE — Progress Notes (Addendum)
 Progress Note  Patient Name: Stanley Watkins Date of Encounter: 02/15/2024 [facility] Cardiologist: Vinie JAYSON Maxcy, MD   Interval Summary   Denies any melena, hematuria, hematochezia, chest pain, shortness of breath, fever, chills, lower extremity edema, abdominal swelling, or orthopnea.  Vital Signs Vitals:   02/15/24 0400 02/15/24 0420 02/15/24 0500 02/15/24 0600  BP: (!) 186/83 (!) 160/79 (!) 158/86 (!) 140/86  Pulse: 69 65 (!) 58 63  Resp: 14 11 10 14   Temp:      TempSrc:      SpO2: 96% 93% 91% 98%  Weight:   123.4 kg   Height:        Intake/Output Summary (Last 24 hours) at 02/15/2024 0755 Last data filed at 02/15/2024 9378 Gross per 24 hour  Intake 2035 ml  Output 400 ml  Net 1635 ml      02/15/2024    5:00 AM 02/14/2024    8:37 AM 02/11/2024   11:27 AM  Last 3 Weights  Weight (lbs) 272 lb 0.8 oz 271 lb 6.2 oz 278 lb 14.1 oz  Weight (kg) 123.4 kg 123.1 kg 126.5 kg      Telemetry/ECG  Normal sinus rhythm with heart rates in the 60s to 80s- Personally Reviewed  Physical Exam  GEN: No acute distress on 2 L nasal cannula.   Neck: No JVD Cardiac: RRR, no murmurs, rubs, or gallops.  Respiratory: Clear to auscultation bilaterally. GI: Soft, nontender, non-distended  MS: No edema  Assessment & Plan   Stanley Watkins is a 73 year old male who has a history of dementia, hypertension, schizoaffective disorder, and T2DM who presented to the ED for nausea, emesis, and diarrhea. He was admitted for suspected UGI bleed and was found to be in new onset A-fib with RVR  New onset atrial fibrillation with RVR CHA2DS2-VASc Score = 3 [CHF History: 0, HTN History: 1, Diabetes History: 1, Stroke History: 0, Vascular Disease History: 0, Age Score: 1, Gender Score: 0].  Therefore, the patient's annual risk of stroke is 3.2 %.    Initially was on IV Cardizem  but appears to be transferred to oral Cardizem .  Anticoagulation was initially held off due to concerns of  upper GI bleed.  GI did an endoscopy on 02/11/2024.  No active GI bleed was seen.  It was suspected the melena was secondary to gastroenteritis. The patient was was started on Eliquis . Echocardiogram on 02/11/2024 showed a normal LVEF of 55 to 60%, no RWMA, G1 DD, normal RV systolic function, and grossly normal valve function. Continue Eliquis  5 mg twice daily Continue Cardizem  CD 360 mg daily Stop metoprolol  succinate 25 mg daily Start Coreg  6.25 mg twice daily Has follow-up appointment with A-fib clinic on 03/08/2024   Hypertension Continues to have an elevated blood pressure.  BP at 8:10 AM this morning was 152/84. Continue hydrochlorothiazide  25 mg daily Continue losartan  100 mg daily Manage Coreg  and Cardizem  as per A-fib above. Recommend keeping a daily blood pressure log at home   OSA Patient had an abnormal sleep study in the past but never followed up for CPAP.  Is agreeable to considering a CPAP.  This would likely help his atrial fibrillation.   Otherwise management per primary   [facility] will sign off.   The patient is ready for discharge today from a cardiac standpoint. Medication Recommendations: As above Follow up as an outpatient: Follow-up with A-fib clinic on 03/08/2024 as scheduled For questions or updates, please contact [facility] Please consult [email or web address] for contact info under        Signed, Morse Clause, PA-C   Patient seen and examined, note reviewed with the signed Resident Physician/Advanced Practice Provider. I personally reviewed laboratory data, imaging studies and relevant notes. I independently examined the patient and formulated the important aspects of the plan. I have personally discussed the plan with the patient and/or family. Comments or changes to the note/plan are indicated below.  My Physical Exam:   General: Well nourished, well developed, in no acute distress Head: Atraumatic, normal size  Eyes: PEERLA, EOMI   Neck: Supple, no JVD Endocrine: No thryomegaly Cardiac: Normal S1, S2; RRR; no murmurs, rubs, or gallops Lungs: Clear to auscultation bilaterally, no wheezing, rhonchi or rales  Abd: Soft, nontender, no hepatomegaly  Ext: No edema, pulses 2+ Musculoskeletal: No deformities, BUE and BLE strength normal and equal Skin: Warm and dry, no rashes   Neuro: Alert and oriented to person, place, time, and situation, CNII-XII grossly intact, no focal deficits  Psych: Normal mood and affect   Data Reviewed by Me:  Telemetry: SR 60s EKG: Afib 136 bpm Echo: LVEF 55-60% Cath/Nuc/CCTA: none  Assessment & Plan: 73 year old male with history of dementia, hypertension, schizoaffective disorder, diabetes admitted on 02/10/2024 for viral gastroenteritis.  Cardiology consulted for new onset A-fib.  # New onset A-fib - Triggered by acute GI illness.  Currently maintaining sinus rhythm.  Continue diltiazem  extended release 360 mg daily.  Continue Eliquis  5 mg twice daily. - Plan for follow-up in A-fib clinic on 03/08/2024. - Encouraged use of CPAP.  OSA is a big trigger for A-fib.  # Hypertension - Continue current home regimen.  Coreg  6.25 mg twice daily, diltiazem  3060 mg daily, HCTZ 25 mg daily, losartan  100 mg daily.  # Viral gastroenteritis - Improved.   [facility] will sign off.   Medication Recommendations:  As above Other recommendations (labs, testing, etc):  none Follow up as an outpatient: Atrial fibrillation clinic on 03/08/2024  Signed, Darryle T. Barbaraann, MD [facility]  [facility]  02/15/2024 12:14 PM

## 2024-02-15 NOTE — Progress Notes (Signed)
 Report called. Patient stable at time of transfer.

## 2024-02-15 NOTE — Progress Notes (Signed)
 PROGRESS NOTE    Stanley Watkins  FMW:[id number] DOB: 25-Nov-1950 DOA: 02/10/2024 PCP: Shelda Atlas, MD    Chief Complaint  Patient presents with   Emesis    Brief Narrative:  Patient 73 year old gentleman history of dementia, depression, hypertension, vitamin D  deficiency, prediabetic, schizoaffective disorder presented to the ED from urgent care with nausea vomiting diarrhea.  Patient also noted to have dark black vomitus, no bright red blood visible on exam.  No history of NSAID use.  Patient admitted due to concerns for upper GI bleed, gastroenteritis.  During the initial hospitalization patient noted to go into A-fib placed on the Cardizem  drip.  Cardiology consulted.  GI consulted.  Patient underwent upper endoscopy 02/11/2024.   Assessment & Plan:   Principal Problem:   GIB (gastrointestinal bleeding) Active Problems:   Atrial fibrillation with rapid ventricular response (HCC)   Schizoaffective disorder (HCC)   AKI (acute kidney injury)   Type 2 diabetes mellitus (HCC)   Depression   Essential hypertension   Dementia without behavioral disturbance (HCC)   Nausea & vomiting   Paroxysmal atrial fibrillation (HCC)   Hypoxia   Aspiration pneumonia (HCC)   Norovirus GI and GII detected  #1 concern for upper GI bleed/nausea,??  Melena likely secondary to gastroenteritis - Patient noted to have presented with black vomitus, nausea, noted to have some diarrhea that was dark in color. - Considered admission was for upper GI bleed. - Hemoglobin on admission noted at 16.1 which was felt to be secondary to hemoconcentration. - Repeat hemoglobin of 13.7 today. - Patient with no further nausea vomiting or diarrhea early on in the hospitalization however had some projectile vomiting early the morning, 02/14/2024. - C. difficile PCR negative.   - GI pathogen panel positive for norovirus G1/GII.   - Patient seen in consultation by GI, underwent upper endoscopy, 02/11/2024 which  noted a 5 cm hiatal hernia, normal esophagus, normal stomach biopsy, normal duodenum. - Continue oral PPI. - Follow H&H. - Appreciate GI input and recommendations.  2.  Acute viral gastroenteritis/norovirus gastroenteritis -Patient noted to have presented nausea vomiting diarrhea felt likely secondary to a gastroenteritis. - Patient with clinical improvement during the hospitalization until patient with projectile vomiting the morning of, 02/14/2024 per RN with emesis noted to be brown/black.  Patient with 1 episode of diarrhea the evening of 02/13/2024.  - C. difficile PCR negative. - GI pathogen panel with norovirus G1/G II detected.  - Supportive care.   3.  Hypoxia/??  Aspiration pneumonia -Patient noted to be hypoxic the morning of 02/14/2024, after having a bout of projectile vomiting. -Chest x-ray no acute cardiopulmonary disease noted. -Patient started on IV Unasyn  with improvement with hypoxia. -Continue IV Unasyn  for another 24 hours and could transition to Augmentin  to complete an empiric 5 to 7-day course of antibiotic treatment.  4.  New onset atrial fibrillation -Likely secondary to problems #1 and 2. - TSH noted at 2.540. - 2D echo with EF of 55 to 60%, grade 1 DD. - Patient seen in consultation by cardiology and patient placed on a Cardizem  drip initially on admission. -Patient noted on 02/11/2024 to be back in normal sinus rhythm with multiple PVCs and patient transitioned to oral Cardizem  60 mg p.o. every 6 hours -Patient noted on telemetry to have multiple couplets, going in and out of atrial fibrillation the evening of 02/12/2024..   - Cardizem  consolidated to Cardizem  CD 360 mg daily per cardiology.   - Patient noted to be going in  and out of A-fib with some sustained elevated heart rates early on in the hospitalization which has since improved.  - Patient status post Lopressor  5 mg IV x 1 (02/12/2024) for heart rates that was sustaining in the 140s to 160s with improvement.   -Continue Cardizem  CD 360 mg daily. -Patient started on Toprol -XL per cardiology on 02/14/2024 and has been changed to Coreg  6.25 mg twice daily per cardiology today. -Keep potassium approximately 4, magnesium  approximately 2. - Patient underwent upper endoscopy with no signs of bleeding, patient started on anticoagulation per cardiology on 02/12/2024 with Eliquis .   - Cost may be an issue with anticoagulation, per wife patient with a secondary insurance of [employer] and as such we will have reassess co-pay..   - TOC consulted for medication assistance.   - Per cardiology.   5.  AKI -Likely secondary to prerenal azotemia secondary to problems #1 and 2 in the setting of losartan -HCTZ. - Renal function improved and AKI resolved  - losartan /HCTZ restarted.   - Renal function stable.   6.  Hypophosphatemia - Phosphorus at 3.0. - Status post 3 days of K-Phos 250 mg twice daily.  - Repeat labs in AM.  7.  Schizoaffective disorder -Continue Neurontin . - Per med rec patient was on Wellbutrin  and currently not taking. - Outpatient follow-up.  8.  Dementia without behavioral disturbance - Continue Aricept .  9.  Hypertension - Was on Cardizem  60 mg p.o. every 6 hours which has been consolidated per cardiology to Cardizem  CD 360 mg daily.  - Patient noted to be on Norvasc , losartan , HCTZ prior to admission. - BP improved on current regimen of losartan  100 mg daily, HCTZ 25 mg daily, Cardizem  CD 360 daily. -Patient started on Toprol -XL and has been changed to Coreg  6.25 mg twice daily per cardiology today. -Follow.  10.  Depression -Per med rec patient was on Wellbutrin  but currently not taking. -Outpatient follow-up.  11.  Diabetes mellitus type 2 -Hemoglobin A1c 7.2. - Patient not on any hypoglycemic agents prior to admission. - CBG noted at 164 this morning. - SSI. - Outpatient follow-up with PCP.   DVT prophylaxis: SCDs Code Status: Full Family Communication: Updated patient and  wife at bedside. Disposition: Likely home when clinically improved and stable, hopefully in the next 24 hours  Status is: Inpatient Remains inpatient appropriate because: Severity of illness   Consultants:  Cardiology: Dr. Mona 02/10/2024 GI: Dr. Saintclair 02/10/2024  Procedures:  2D echo 02/11/2024 Upper endoscopy 02/11/2024 per Dr. Saintclair  Antimicrobials:  Anti-infectives (From admission, onward)    Start     Dose/Rate Route Frequency Ordered Stop   02/14/24 0900  Ampicillin -Sulbactam (UNASYN ) 3 g in sodium chloride  0.9 % 100 mL IVPB        3 g 200 mL/hr over 30 Minutes Intravenous Every 6 hours 02/14/24 0807           Subjective: Patient laying in bed.  Denies any chest pain or shortness of breath.  No abdominal pain.  No further projectile vomiting over the past 24 hours.  No diarrhea.  Overall feeling well.  In normal sinus rhythm with occasional PVCs.  Wife at bedside.      Objective: Vitals:   02/15/24 0800 02/15/24 0810 02/15/24 0900 02/15/24 1000  BP: (!) 168/88 (!) 152/84 (!) 147/100 135/83  Pulse: 70 71 85 72  Resp: 18 14 (!) 25 12  Temp:      TempSrc:      SpO2: 93% 97% 97% 97%  Weight:      Height:        Intake/Output Summary (Last 24 hours) at 02/15/2024 1056 Last data filed at 02/15/2024 1014 Gross per 24 hour  Intake 2155 ml  Output 1175 ml  Net 980 ml   Filed Weights   02/11/24 1127 02/14/24 0837 02/15/24 0500  Weight: 126.5 kg 123.1 kg 123.4 kg    Examination:  General exam: NAD Respiratory system: Lungs clear to auscultation bilaterally.  No wheezes, no crackles, no rhonchi.  Fair air movement.  Speaking in full sentences.  No use of accessory muscles of respiration.  Cardiovascular system: Regular rate rhythm no murmurs rubs or gallops.  No JVD.  No pitting lower extremity edema. Gastrointestinal system: Abdomen is soft, nontender, nondistended, positive bowel sounds.  No rebound.  No guarding.  Central nervous system: Alert and oriented. No  focal neurological deficits. Extremities: Symmetric 5 x 5 power. Skin: No rashes, lesions or ulcers Psychiatry: Judgement and insight appear poor to fair. Mood & affect appropriate.     Data Reviewed: I have personally reviewed following labs and imaging studies  CBC: Recent Labs  Lab 02/11/24 1513 02/12/24 0336 02/13/24 0253 02/14/24 0258 02/15/24 0238  WBC 4.7 4.2 5.6 6.9 6.1  NEUTROABS  --   --   --  4.4  --   HGB 14.8 14.5 15.2 15.7 13.7  HCT 47.2 45.3 48.5 49.2 43.9  MCV 86.4 84.0 86.1 85.1 86.4  PLT 119* 158 160 175 167    Basic Metabolic Panel: Recent Labs  Lab 02/10/24 1735 02/10/24 2337 02/11/24 0341 02/12/24 0336 02/13/24 0253 02/14/24 0258 02/15/24 0238  NA  --  138 136 136 136 135 138  K  --  3.7 3.9 3.7 3.9 4.0 3.8  CL  --  104 103 103 102 101 103  CO2  --  26 27 24 23 23 27   GLUCOSE  --  123* 142* 124* 123* 121* 114*  BUN  --  15 14 9 11 16 21   CREATININE  --  1.12 1.08 0.97 1.18 1.14 1.20  CALCIUM  --  8.5* 8.9 9.1 9.1 9.0 8.2*  MG 2.0 1.9  --  2.3  --  2.6* 2.4  PHOS  --  2.0* 2.1*  --   --  3.0 3.0    GFR: Estimated Creatinine Clearance: 75.5 mL/min (by C-G formula based on SCr of 1.2 mg/dL).  Liver Function Tests: Recent Labs  Lab 02/10/24 1327 02/15/24 0238  AST 19  --   ALT 24  --   ALKPHOS 67  --   BILITOT 0.4  --   PROT 7.1  --   ALBUMIN 3.7 3.6    CBG: Recent Labs  Lab 02/14/24 0811 02/14/24 1223 02/14/24 1624 02/14/24 2113 02/15/24 0752  GLUCAP 138* 105* 138* 121* 164*     Recent Results (from the past 240 hours)  MRSA Next Gen by PCR, Nasal     Status: None   Collection Time: 02/10/24  7:19 PM   Specimen: Nasal Mucosa; Nasal Swab  Result Value Ref Range Status   MRSA by PCR Next Gen NOT DETECTED NOT DETECTED Final    Comment: (NOTE) The GeneXpert MRSA Assay (FDA approved for NASAL specimens only), is one component of a comprehensive MRSA colonization surveillance program. It is not intended to diagnose MRSA  infection nor to guide or monitor treatment for MRSA infections. Test performance is not FDA approved in patients less than 58 years old. Performed at  [facility], [address], [place], [place] [address]   Gastrointestinal Panel by PCR , Stool     Status: Abnormal   Collection Time: 02/13/24  6:13 AM   Specimen: Stool  Result Value Ref Range Status   Campylobacter species NOT DETECTED NOT DETECTED Final   Plesimonas shigelloides NOT DETECTED NOT DETECTED Final   Salmonella species NOT DETECTED NOT DETECTED Final   Yersinia enterocolitica NOT DETECTED NOT DETECTED Final   Vibrio species NOT DETECTED NOT DETECTED Final   Vibrio cholerae NOT DETECTED NOT DETECTED Final   Enteroaggregative E coli (EAEC) NOT DETECTED NOT DETECTED Final   Enteropathogenic E coli (EPEC) NOT DETECTED NOT DETECTED Final   Enterotoxigenic E coli (ETEC) NOT DETECTED NOT DETECTED Final   Shiga like toxin producing E coli (STEC) NOT DETECTED NOT DETECTED Final   Shigella/Enteroinvasive E coli (EIEC) NOT DETECTED NOT DETECTED Final   Cryptosporidium NOT DETECTED NOT DETECTED Final   Cyclospora cayetanensis NOT DETECTED NOT DETECTED Final   Entamoeba histolytica NOT DETECTED NOT DETECTED Final   Giardia lamblia NOT DETECTED NOT DETECTED Final   Adenovirus F40/41 NOT DETECTED NOT DETECTED Final   Astrovirus NOT DETECTED NOT DETECTED Final   Norovirus GI/GII DETECTED (A) NOT DETECTED Final    Comment: RESULT CALLED TO, READ BACK BY AND VERIFIED WITH: COURTNEY GRIFFIN ON 02/14/24 AT 1558 QSD    Rotavirus A NOT DETECTED NOT DETECTED Final   Sapovirus (I, II, IV, and V) NOT DETECTED NOT DETECTED Final    Comment: Performed at [facility], [address] [place], [place] [address]  C Difficile Quick Screen w PCR reflex     Status: None   Collection Time: 02/13/24  6:13 AM   Specimen: STOOL  Result Value Ref Range Status   C Diff antigen NEGATIVE NEGATIVE Final   C Diff toxin  NEGATIVE NEGATIVE Final   C Diff interpretation No C. difficile detected.  Final    Comment: Performed at [facility], [address] [place], [place] [address]         Radiology Studies: DG Abd Portable 1V Result Date: 02/14/2024 EXAM: 1 VIEW XRAY OF THE ABDOMEN 02/14/2024 08:14:00 AM COMPARISON: None available. CLINICAL HISTORY: Hypoxia, emphysema, GI bleed. FINDINGS: BOWEL: Nonobstructive bowel gas pattern. SOFT TISSUES: No abnormal calcifications. BONES: No acute fracture. DISCS/DEGENERATIVE CHANGES: Lower thoracic and lumbar spondylosis. IMPRESSION: 1. No acute abdominal findings. 2. Lower thoracic and lumbar spondylosis. Electronically signed by: Ryan Salvage MD 02/14/2024 12:41 PM EST RP Workstation: [id number]   DG CHEST PORT 1 VIEW Result Date: 02/14/2024 EXAM: 1 VIEW XRAY OF THE CHEST 02/14/2024 08:14:00 AM COMPARISON: 02/12/2024 CLINICAL HISTORY: Hypoxia, emphysema, gastrointestinal bleed. FINDINGS: LUNGS AND PLEURA: No focal pulmonary opacity. No pleural effusion. No pneumothorax. HEART AND MEDIASTINUM: No acute abnormality of the cardiac and mediastinal silhouettes. BONES AND SOFT TISSUES: No acute osseous abnormality. IMPRESSION: 1. No acute cardiopulmonary process. Electronically signed by: Ryan Salvage MD 02/14/2024 12:40 PM EST RP Workstation: [id number]        Scheduled Meds:  apixaban   5 mg Oral BID   carvedilol   6.25 mg Oral BID WC   Chlorhexidine  Gluconate Cloth  6 each Topical Daily   diltiazem   360 mg Oral Daily   donepezil   10 mg Oral Daily   gabapentin   100 mg Oral TID   hydrochlorothiazide   25 mg Oral Daily   insulin  aspart  0-9 Units Subcutaneous TID WC   losartan   100 mg Oral Daily  pantoprazole   40 mg Oral Daily   Continuous Infusions:  sodium chloride  125 mL/hr at 02/15/24 9177   ampicillin -sulbactam (UNASYN ) IV Stopped (02/15/24 0854)      LOS: 1 day    Time spent: 40 minutes    Toribio Hummer, MD [facility]   To contact the attending provider between 7A-7P or the covering provider during after hours 7P-7A, please log into the web site [email or web address] and access using universal [place] password for that web site. If you do not have the password, please call the hospital operator.  02/15/2024, 10:56 AM

## 2024-02-16 ENCOUNTER — Other Ambulatory Visit (HOSPITAL_COMMUNITY): Payer: Self-pay

## 2024-02-16 LAB — GLUCOSE, CAPILLARY
Glucose-Capillary: 130 mg/dL — ABNORMAL HIGH (ref 70–99)
Glucose-Capillary: 116 mg/dL — ABNORMAL HIGH (ref 70–99)
Glucose-Capillary: 126 mg/dL — ABNORMAL HIGH (ref 70–99)

## 2024-02-16 LAB — BASIC METABOLIC PANEL WITH GFR
Anion gap: 8 (ref 5–15)
BUN: 13 mg/dL (ref 8–23)
CO2: 27 mmol/L (ref 22–32)
Calcium: 9 mg/dL (ref 8.9–10.3)
Chloride: 100 mmol/L (ref 98–111)
Creatinine, Ser: 0.95 mg/dL (ref 0.61–1.24)
GFR, Estimated: >60 mL/min (ref >60)
Glucose, Bld: 122 mg/dL — ABNORMAL HIGH (ref 70–99)
Potassium: 4 mmol/L (ref 3.5–5.1)
Sodium: 135 mmol/L (ref 135–145)

## 2024-02-16 LAB — CBC
HCT: 43.5 % (ref 39.0–52.0)
Hemoglobin: 13.8 g/dL (ref 13.0–17.0)
MCH: 27.3 pg (ref 26.0–34.0)
MCHC: 31.7 g/dL (ref 30.0–36.0)
MCV: 86 fL (ref 80.0–100.0)
Platelets: 163 K/uL (ref 150–400)
RBC: 5.06 MIL/uL (ref 4.22–5.81)
RDW: 16.1 % — ABNORMAL HIGH (ref 11.5–15.5)
WBC: 5.7 K/uL (ref 4.0–10.5)
nRBC: 0 % (ref 0.0–0.2)

## 2024-02-16 LAB — MAGNESIUM: Magnesium: 2.4 mg/dL (ref 1.7–2.4)

## 2024-02-16 MED ORDER — CARVEDILOL 6.25 MG PO TABS
6.2500 mg | ORAL_TABLET | Freq: Two times a day (BID) | ORAL | 2 refills | Status: AC
  Filled 2024-02-16: qty 60, 30d supply, fill #0

## 2024-02-16 MED ORDER — APIXABAN 5 MG PO TABS
5.0000 mg | ORAL_TABLET | Freq: Two times a day (BID) | ORAL | 2 refills | Status: AC
  Filled 2024-02-16: qty 60, 30d supply, fill #0

## 2024-02-16 MED ORDER — PANTOPRAZOLE SODIUM 40 MG PO TBEC
40.0000 mg | DELAYED_RELEASE_TABLET | Freq: Every day | ORAL | 0 refills | Status: AC
  Filled 2024-02-16: qty 30, 30d supply, fill #0

## 2024-02-16 MED ORDER — AMOXICILLIN-POT CLAVULANATE 875-125 MG PO TABS
1.0000 | ORAL_TABLET | Freq: Two times a day (BID) | ORAL | Status: DC
  Administered 2024-02-16: 1 via ORAL
  Filled 2024-02-16: qty 1

## 2024-02-16 MED ORDER — AMOXICILLIN-POT CLAVULANATE 875-125 MG PO TABS
1.0000 | ORAL_TABLET | Freq: Two times a day (BID) | ORAL | 0 refills | Status: AC
  Filled 2024-02-16: qty 10, 5d supply, fill #0

## 2024-02-16 MED ORDER — DILTIAZEM HCL ER COATED BEADS 360 MG PO CP24
360.0000 mg | ORAL_CAPSULE | Freq: Every day | ORAL | 2 refills | Status: DC
  Filled 2024-02-16: qty 30, 30d supply, fill #0

## 2024-02-16 NOTE — Progress Notes (Signed)
 AVS reviewed with patient and his wife - both verbalized an understanding. PIV x 2 removed as noted. Tele box removed - returned to main desk. Patient dressing for discharge to home.

## 2024-02-16 NOTE — Care Management Important Message (Signed)
 Important Message  Patient Details IM Letter given. Name: KHUSH PASION MRN: [id number] Date of Birth: 01-Jun-1950   Important Message Given:  Yes - Medicare IM     Melba Ates 02/16/2024, 3:21 PM

## 2024-02-16 NOTE — TOC Transition Note (Signed)
 Transition of Care [facility]) - Discharge Note   Patient Details  Name: Stanley Watkins MRN: [id number] Date of Birth: 1951-01-04  Transition of Care [facility]) CM/SW Contact:  Bascom Service, RN Phone Number: 02/16/2024, 11:52 AM   Clinical Narrative: Spoke to spouse Apolinar about d/c plans-d/c home w/HHC-[facility] rep cory accepted for HHPT/OT/aide. Noted pharmacy f/u meds. Meds to bed.Has own transport home. No further CM needs.      Final next level of care: Home w Home Health Services Barriers to Discharge: No Barriers Identified   Patient Goals and CMS Choice Patient states their goals for this hospitalization and ongoing recovery are:: home CMS Medicare.gov Compare Post Acute Care list provided to:: Patient Represenative (must comment) (Wanda(spouse)) Choice offered to / list presented to : Spouse [place] ownership interest in [facility].provided to:: Spouse    Discharge Placement                       Discharge Plan and Services Additional resources added to the After Visit Summary for   In-house Referral: NA Discharge Planning Services: CM Consult Post Acute Care Choice: Resumption of Svcs/PTA Provider          DME Arranged: N/A DME Agency: NA       HH Arranged: NA HH Agency: NA        Social Drivers of Health (SDOH) Interventions SDOH Screenings   Food Insecurity: No Food Insecurity (02/10/2024)  Housing: Low Risk  (02/10/2024)  Transportation Needs: No Transportation Needs (02/10/2024)  Utilities: Not At Risk (02/10/2024)  Social Connections: Socially Isolated (02/10/2024)  Tobacco Use: Low Risk  (02/11/2024)     Readmission Risk Interventions    02/12/2024    4:50 PM  Readmission Risk Prevention Plan  Post Dischage Appt Complete  Medication Screening Complete  Transportation Screening Complete

## 2024-02-16 NOTE — Progress Notes (Signed)
 Discharge meds in a secure bag delivered to patient's nurse

## 2024-02-16 NOTE — Plan of Care (Signed)
  Problem: Activity: Goal: Ability to tolerate increased activity will improve Outcome: Progressing   Problem: Fluid Volume: Goal: Ability to maintain a balanced intake and output will improve Outcome: Progressing   Problem: Metabolic: Goal: Ability to maintain appropriate glucose levels will improve Outcome: Progressing   Problem: Clinical Measurements: Goal: Ability to maintain clinical measurements within normal limits will improve Outcome: Progressing   Problem: Activity: Goal: Risk for activity intolerance will decrease Outcome: Progressing   Problem: Elimination: Goal: Will not experience complications related to bowel motility Outcome: Progressing   Problem: Elimination: Goal: Will not experience complications related to urinary retention Outcome: Progressing   Problem: Safety: Goal: Ability to remain free from injury will improve Outcome: Progressing   Problem: Skin Integrity: Goal: Risk for impaired skin integrity will decrease Outcome: Progressing

## 2024-02-16 NOTE — Discharge Summary (Signed)
 Physician Discharge Summary  Stanley Watkins FMW:[id number] DOB: [birth year] DOA: 02/10/2024  PCP: Shelda Atlas, MD  Admit date: 02/10/2024 Discharge date: 02/16/2024  Time spent: 60 minutes  Recommendations for Outpatient Follow-up:  Follow-up in atrial fibrillation clinic as scheduled on 03/08/2024 at 8:30 AM. Follow-up with Shelda Atlas, MD in 2 weeks.  On follow-up patient will need a comprehensive metabolic profile, magnesium  level, phosphorus level, CBC done to follow-up on electrolytes, renal function and counts.    Discharge Diagnoses:  Principal Problem:   GIB (gastrointestinal bleeding) Active Problems:   Atrial fibrillation with rapid ventricular response (HCC)   Schizoaffective disorder (HCC)   AKI (acute kidney injury)   Type 2 diabetes mellitus (HCC)   Depression   Essential hypertension   Dementia without behavioral disturbance (HCC)   Nausea & vomiting   Paroxysmal atrial fibrillation (HCC)   Hypoxia   Aspiration pneumonia (HCC)   Norovirus GI and GII detected   Discharge Condition: Stable and improved.  Diet recommendation: Heart healthy  Filed Weights   02/11/24 1127 02/14/24 0837 02/15/24 0500  Weight: 126.5 kg 123.1 kg 123.4 kg    History of present illness:  HPI per Dr. Willette Blair A Galeas is a 73 year old male with extensive history of dementia, depression, HTN, vitamin D  deficiency, prediabetic schizoaffective disorder ... Presented to ED after evaluation at urgent care for chief complaint of nausea vomiting, diarrhea since yesterday.  Vomiting dark, black material no bright red or visible blood in her vomitus.  Last episode of vomitus this a.m.  No history of NSAID use, denies of having any abdominal pain or cramping.  Denies any fever or chills.   ED Evaluation: Blood pressure 118/85, pulse (!) 104, temperature 98.9 F (37.2 C), temperature source Oral, resp. rate 18, height 5' 11 (1.803 m), weight 131.5 kg, SpO2 98%.    LABs: CBC CMP reviewed, hemoglobin 16.1, BUN 17, creatinine 1.39, calcium 8.8, Glucose 120, Fecal Hemoccult positive x 2   Patient received IV Protonix , GI was consulted per EDP Requested patient to be admitted for evaluation      Patient Denies having: Fever, Chills, Cough, SOB, Chest Pain, Abd pain, N/V/D, headache, dizziness, lightheadedness,  Dysuria, Joint pain, rash, open wounds   ED Course:   Blood pressure 118/85, pulse (!) 104, temperature 98.7 F (37.1 C), temperature source Oral, resp. rate 18, height 5' 11 (1.803 m), weight 131.5 kg, SpO2 98%. Abnormal labs;     Hospital Course:  #1 concern for upper GI bleed/nausea,??  Melena likely secondary to gastroenteritis/GI bleed ruled out. - Patient noted to have presented with black vomitus, nausea, noted to have some diarrhea that was dark in color. - Initial admission was for upper GI bleed. - Hemoglobin on admission noted at 16.1 which was felt to be secondary to hemoconcentration. - Hemoglobin stabilized at 13.8 by day of discharge.  - Patient with no further nausea vomiting or diarrhea early on in the hospitalization however had some projectile vomiting early the morning, 02/14/2024. - C. difficile PCR negative.   - GI pathogen panel positive for norovirus G1/GII.   - Patient seen in consultation by GI, underwent upper endoscopy, 02/11/2024 which noted a 5 cm hiatal hernia, normal esophagus, normal stomach biopsy, normal duodenum. -Patient maintained on oral PPI. -Outpatient follow-up with PCP.   2.  Acute viral gastroenteritis/norovirus gastroenteritis -Patient noted to have presented nausea vomiting diarrhea felt likely secondary to a gastroenteritis. - Patient with clinical improvement during the hospitalization until patient with projectile vomiting  the morning of, 02/14/2024 per RN with emesis noted to be brown/black.  Patient with 1 episode of diarrhea the evening of 02/13/2024.  - C. difficile PCR negative. - GI  pathogen panel with norovirus G1/G II detected.  -Patient maintained on supportive care. -Patient improved clinically and be discharged home in stable and improved condition.   3.  Hypoxia/??  Aspiration pneumonia -Patient noted to be hypoxic the morning of 02/14/2024, after having a bout of projectile vomiting. -Chest x-ray no acute cardiopulmonary disease noted. -Patient started on IV Unasyn  with improvement with hypoxia. -Patient was discharged on 5 more days of Augmentin  to complete an empiric course of antibiotic treatment.   4.  New onset atrial fibrillation -Likely secondary to problems #1 and 2. - TSH noted at 2.540. - 2D echo with EF of 55 to 60%, grade 1 DD. - Patient seen in consultation by cardiology and patient placed on a Cardizem  drip initially on admission. -Patient noted on 02/11/2024 to be back in normal sinus rhythm with multiple PVCs and patient transitioned to oral Cardizem  60 mg p.o. every 6 hours -Patient noted on telemetry to have multiple couplets, going in and out of atrial fibrillation the evening of 02/12/2024..   - Cardizem  consolidated to Cardizem  CD 360 mg daily per cardiology.   - Patient noted to be going in and out of A-fib with some sustained elevated heart rates early on in the hospitalization which has since improved.  - Patient status post Lopressor  5 mg IV x 1 (02/12/2024) for heart rates that was sustaining in the 140s to 160s with improvement.  - Patient maintained on Cardizem  CD 360 mg daily. -Patient started on Toprol -XL per cardiology on 02/14/2024 and changed to Coreg  6.25 mg twice daily per cardiology today. -Keep potassium approximately 4, magnesium  approximately 2. - Patient underwent upper endoscopy with no signs of bleeding, patient started on anticoagulation per cardiology on 02/12/2024 with Eliquis .   - Cost may be an issue with anticoagulation, per wife patient with a secondary insurance of [employer] and as such we will have reassess co-pay..   -  TOC consulted for medication assistance.   - Outpatient follow-up in A-fib clinic.   5.  AKI -Likely secondary to prerenal azotemia secondary to problems #1 and 2 in the setting of losartan -HCTZ. - Renal function improved and AKI resolved.  - losartan /HCTZ resumed.   - Renal function remained stable.   - Outpatient follow-up with PCP.    6.  Hypophosphatemia - Repleted during the hospitalization.   - Outpatient follow-up with PCP.    7.  Schizoaffective disorder - Patient maintained on home regimen Neurontin .  - Per med rec patient was on Wellbutrin  and currently not taking. - Outpatient follow-up with PCP.   8.  Dementia without behavioral disturbance - Patient maintained on home regimen Aricept .     9.  Hypertension - Was on Cardizem  60 mg p.o. every 6 hours which has been consolidated per cardiology to Cardizem  CD 360 mg daily.  - Patient noted to be on Norvasc , losartan , HCTZ prior to admission. - BP improved on regimen of losartan  100 mg daily, HCTZ 25 mg daily, Cardizem  CD 360 daily. -Patient started on Toprol -XL and changed to Coreg  6.25 mg twice daily per cardiology. -Blood pressure remained stable.   10.  Depression -Per med rec patient was on Wellbutrin  but currently not taking. -Outpatient follow-up with PCP.   11.  Diabetes mellitus type 2 -Hemoglobin A1c 7.2. - Patient not on any hypoglycemic  agents prior to admission. - Patient maintained on sliding scale insulin  during the hospitalization.  -Outpatient follow-up with PCP.     Procedures: 2D echo 02/11/2024 Upper endoscopy 02/11/2024 per Dr. Saintclair  Consultations: Cardiology: Dr. Mona 02/10/2024 GI: Dr. Saintclair 02/10/2024    Discharge Exam: Vitals:   02/16/24 0640 02/16/24 1332  BP: 117/64 132/80  Pulse: 62 77  Resp: 18 20  Temp: 98.3 F (36.8 C) 98.5 F (36.9 C)  SpO2: 95% 97%    General: NAD Cardiovascular: RRR no murmurs rubs or gallops.  No JVD.  No lower extremity edema. Respiratory: CTAB.   No wheezes, no crackles, no rhonchi.  Fair air movement.  Speaking in full sentences.  Discharge Instructions   Discharge Instructions     Diet - low sodium heart healthy   Complete by: As directed    Increase activity slowly   Complete by: As directed    Pulmonary Visit   Complete by: As directed    Reason for referral: Sleep/Apnea      Allergies as of 02/16/2024   No Known Allergies      Medication List     PAUSE taking these medications    aspirin EC 81 MG tablet Wait to take this until your doctor or other care provider tells you to start again. Take 81 mg by mouth daily. Swallow whole.       STOP taking these medications    amLODipine  10 MG tablet Commonly known as: NORVASC        TAKE these medications    amoxicillin -clavulanate 875-125 MG tablet Commonly known as: AUGMENTIN  Take 1 tablet by mouth every 12 (twelve) hours for 5 days.   apixaban  5 MG Tabs tablet Commonly known as: ELIQUIS  Take 1 tablet (5 mg total) by mouth 2 (two) times daily.   buPROPion  150 MG 24 hr tablet Commonly known as: WELLBUTRIN  XL Take 150 mg by mouth daily.   carvedilol  6.25 MG tablet Commonly known as: COREG  Take 1 tablet (6.25 mg total) by mouth 2 (two) times daily with a meal.   diltiazem  360 MG 24 hr capsule Commonly known as: CARDIZEM  CD Take 1 capsule (360 mg total) by mouth daily. Start taking on: February 17, 2024   donepezil  10 MG tablet Commonly known as: ARICEPT  Take 10 mg by mouth daily.   gabapentin  100 MG capsule Commonly known as: NEURONTIN  Take 100 mg by mouth 2 (two) times daily.   losartan -hydrochlorothiazide  100-25 MG tablet Commonly known as: HYZAAR Take 1 tablet by mouth daily.   pantoprazole  40 MG tablet Commonly known as: PROTONIX  Take 1 tablet (40 mg total) by mouth daily. Start taking on: February 17, 2024   Vitamin D  (Ergocalciferol ) 1.25 MG (50000 UNIT) Caps capsule Commonly known as: DRISDOL Take 50,000 Units by mouth once a  week.       No Known Allergies  Contact information for follow-up providers     Shelda Atlas, MD. Schedule an appointment as soon as possible for a visit in 2 week(s).   Specialty: Internal Medicine Contact information: [address] [place] [address] [phone]         Atrial Fib Clinic at [facility] Dept of [facility] Follow up on 03/08/2024.   Specialty: Cardiology Why: Follow-up at 8:30 AM. Contact information: [address] [place]  [address] [phone]             Contact information for after-discharge care  Home Medical Care     [facility]) .   Service: Home Health Services Why: PT/OT/aide Contact information: [address] [place]  [address] [phone]                      The results of significant diagnostics from this hospitalization (including imaging, microbiology, ancillary and laboratory) are listed below for reference.    Significant Diagnostic Studies: DG Abd Portable 1V Result Date: 02/14/2024 EXAM: 1 VIEW XRAY OF THE ABDOMEN 02/14/2024 08:14:00 AM COMPARISON: None available. CLINICAL HISTORY: Hypoxia, emphysema, GI bleed. FINDINGS: BOWEL: Nonobstructive bowel gas pattern. SOFT TISSUES: No abnormal calcifications. BONES: No acute fracture. DISCS/DEGENERATIVE CHANGES: Lower thoracic and lumbar spondylosis. IMPRESSION: 1. No acute abdominal findings. 2. Lower thoracic and lumbar spondylosis. Electronically signed by: Ryan Salvage MD 02/14/2024 12:41 PM EST RP Workstation: [id number]   DG CHEST PORT 1 VIEW Result Date: 02/14/2024 EXAM: 1 VIEW XRAY OF THE CHEST 02/14/2024 08:14:00 AM COMPARISON: 02/12/2024 CLINICAL HISTORY: Hypoxia, emphysema, gastrointestinal bleed. FINDINGS: LUNGS AND PLEURA: No focal pulmonary opacity. No pleural effusion. No pneumothorax. HEART AND MEDIASTINUM: No acute abnormality of the cardiac and  mediastinal silhouettes. BONES AND SOFT TISSUES: No acute osseous abnormality. IMPRESSION: 1. No acute cardiopulmonary process. Electronically signed by: Ryan Salvage MD 02/14/2024 12:40 PM EST RP Workstation: [id number]   DG CHEST PORT 1 VIEW Result Date: 02/12/2024 CLINICAL DATA:  Shortness of breath EXAM: PORTABLE CHEST 1 VIEW COMPARISON:  Chest radiograph dated 09/21/2014 FINDINGS: Low lung volumes with bronchovascular crowding. Bibasilar patchy and linear opacities. No pleural effusion or pneumothorax. The heart size and mediastinal contours are within normal limits. No acute osseous abnormality. IMPRESSION: Low lung volumes with bronchovascular crowding. Bibasilar patchy and linear opacities, likely atelectasis. Aspiration or pneumonia can be considered in the appropriate clinical setting. Electronically Signed   By: Limin  Xu M.D.   On: 02/12/2024 13:54   ECHOCARDIOGRAM COMPLETE Result Date: 02/11/2024    ECHOCARDIOGRAM REPORT   Patient Name:   PELLEGRINO KENNARD Date of Exam: 02/11/2024 Medical Rec #:  [id number]             Height:       71.0 in Accession #:    [id number]            Weight:       278.9 lb Date of Birth:  1951-02-12            BSA:          2.429 m Patient Age:    72 years              BP:           155/75 mmHg Patient Gender: M                     HR:           86 bpm. Exam Location:  Inpatient Procedure: 2D Echo, Cardiac Doppler, Color Doppler and Intracardiac            Opacification Agent (Both Spectral and Color Flow Doppler were            utilized during procedure). Indications:    Atrial Fibrillation  History:        Patient has no prior history of Echocardiogram examinations.                 Arrythmias:Atrial Fibrillation; Risk Factors:Hypertension and  Diabetes.  Sonographer:    Philomena Daring Referring Phys: BELLICUS.BORA KENNETH C HILTY  Sonographer Comments: Technically difficult study due to poor echo windows, suboptimal parasternal window and patient is obese.  IMPRESSIONS  1. Left ventricular ejection fraction, by estimation, is 55 to 60%. The left ventricle has normal function. Left ventricular endocardial border not optimally defined to evaluate regional wall motion. Left ventricular diastolic parameters are consistent with Grade I diastolic dysfunction (impaired relaxation).  2. Right ventricular systolic function is normal. The right ventricular size is normal.  3. The mitral valve is normal in structure. No evidence of mitral valve regurgitation. No evidence of mitral stenosis.  4. The aortic valve was not well visualized. Aortic valve regurgitation is not visualized. No aortic stenosis is present. Comparison(s): No prior Echocardiogram. Technically difficult evaluation but overall normal EF. FINDINGS  Left Ventricle: Left ventricular ejection fraction, by estimation, is 55 to 60%. The left ventricle has normal function. Left ventricular endocardial border not optimally defined to evaluate regional wall motion. Definity  contrast agent was given IV to delineate the left ventricular endocardial borders. The left ventricular internal cavity size was normal in size. There is no left ventricular hypertrophy. Left ventricular diastolic parameters are consistent with Grade I diastolic dysfunction (impaired relaxation). Right Ventricle: The right ventricular size is normal. No increase in right ventricular wall thickness. Right ventricular systolic function is normal. Left Atrium: Left atrial size was normal in size. Right Atrium: Right atrial size was normal in size. Pericardium: There is no evidence of pericardial effusion. Mitral Valve: The mitral valve is normal in structure. No evidence of mitral valve regurgitation. No evidence of mitral valve stenosis. Tricuspid Valve: The tricuspid valve is normal in structure. Tricuspid valve regurgitation is not demonstrated. No evidence of tricuspid stenosis. Aortic Valve: The aortic valve was not well visualized. Aortic valve  regurgitation is not visualized. No aortic stenosis is present. Pulmonic Valve: The pulmonic valve was not well visualized. Pulmonic valve regurgitation is not visualized. No evidence of pulmonic stenosis. Aorta: The aortic root is normal in size and structure. Venous: The inferior vena cava was not well visualized. IAS/Shunts: No atrial level shunt detected by color flow Doppler.  LEFT VENTRICLE PLAX 2D LVIDd:         3.40 cm   Diastology LVIDs:         2.40 cm   LV e' medial:    4.46 cm/s LV PW:         1.10 cm   LV E/e' medial:  11.1 LV IVS:        1.10 cm   LV e' lateral:   6.96 cm/s LVOT diam:     2.00 cm   LV E/e' lateral: 7.1 LV SV:         66 LV SV Index:   27 LVOT Area:     3.14 cm  RIGHT VENTRICLE TAPSE (M-mode): 2.0 cm LEFT ATRIUM             Index        RIGHT ATRIUM           Index LA diam:        2.90 cm 1.19 cm/m   RA Area:     12.00 cm LA Vol (A2C):   21.4 ml 8.81 ml/m   RA Volume:   27.90 ml  11.49 ml/m LA Vol (A4C):   26.4 ml 10.87 ml/m LA Biplane Vol: 25.4 ml 10.46 ml/m  AORTIC VALVE LVOT Vmax:  110.00 cm/s LVOT Vmean:  74.100 cm/s LVOT VTI:    0.209 m MITRAL VALVE MV Area (PHT): 5.58 cm    SHUNTS MV Decel Time: 136 msec    Systemic VTI:  0.21 m MV E velocity: 49.30 cm/s  Systemic Diam: 2.00 cm MV A velocity: 83.10 cm/s MV E/A ratio:  0.59 Franck Azobou Tonleu Electronically signed by Joelle Cedars Tonleu Signature Date/Time: 02/11/2024/11:29:42 AM    Final     Microbiology: Recent Results (from the past 240 hours)  MRSA Next Gen by PCR, Nasal     Status: None   Collection Time: 02/10/24  7:19 PM   Specimen: Nasal Mucosa; Nasal Swab  Result Value Ref Range Status   MRSA by PCR Next Gen NOT DETECTED NOT DETECTED Final    Comment: (NOTE) The GeneXpert MRSA Assay (FDA approved for NASAL specimens only), is one component of a comprehensive MRSA colonization surveillance program. It is not intended to diagnose MRSA infection nor to guide or monitor treatment for MRSA  infections. Test performance is not FDA approved in patients less than 2 years old. Performed at [facility], [address] [place], [place] [address]   Gastrointestinal Panel by PCR , Stool     Status: Abnormal   Collection Time: 02/13/24  6:13 AM   Specimen: Stool  Result Value Ref Range Status   Campylobacter species NOT DETECTED NOT DETECTED Final   Plesimonas shigelloides NOT DETECTED NOT DETECTED Final   Salmonella species NOT DETECTED NOT DETECTED Final   Yersinia enterocolitica NOT DETECTED NOT DETECTED Final   Vibrio species NOT DETECTED NOT DETECTED Final   Vibrio cholerae NOT DETECTED NOT DETECTED Final   Enteroaggregative E coli (EAEC) NOT DETECTED NOT DETECTED Final   Enteropathogenic E coli (EPEC) NOT DETECTED NOT DETECTED Final   Enterotoxigenic E coli (ETEC) NOT DETECTED NOT DETECTED Final   Shiga like toxin producing E coli (STEC) NOT DETECTED NOT DETECTED Final   Shigella/Enteroinvasive E coli (EIEC) NOT DETECTED NOT DETECTED Final   Cryptosporidium NOT DETECTED NOT DETECTED Final   Cyclospora cayetanensis NOT DETECTED NOT DETECTED Final   Entamoeba histolytica NOT DETECTED NOT DETECTED Final   Giardia lamblia NOT DETECTED NOT DETECTED Final   Adenovirus F40/41 NOT DETECTED NOT DETECTED Final   Astrovirus NOT DETECTED NOT DETECTED Final   Norovirus GI/GII DETECTED (A) NOT DETECTED Final    Comment: RESULT CALLED TO, READ BACK BY AND VERIFIED WITH: COURTNEY GRIFFIN ON 02/14/24 AT 1558 QSD    Rotavirus A NOT DETECTED NOT DETECTED Final   Sapovirus (I, II, IV, and V) NOT DETECTED NOT DETECTED Final    Comment: Performed at [facility], [address] [place], [place] [address]  C Difficile Quick Screen w PCR reflex     Status: None   Collection Time: 02/13/24  6:13 AM   Specimen: STOOL  Result Value Ref Range Status   C Diff antigen NEGATIVE NEGATIVE Final   C Diff toxin NEGATIVE NEGATIVE Final   C Diff interpretation No C.  difficile detected.  Final    Comment: Performed at [facility], [address], [place], [place] [address]     Labs: Basic Metabolic Panel: Recent Labs  Lab 02/10/24 2337 02/11/24 0341 02/12/24 0336 02/13/24 0253 02/14/24 0258 02/15/24 0238 02/16/24 0525  NA 138 136 136 136 135 138 135  K 3.7 3.9 3.7 3.9 4.0 3.8 4.0  CL 104 103 103 102 101 103 100  CO2 26 27  24 23 23 27 27   GLUCOSE 123* 142* 124* 123* 121* 114* 122*  BUN 15 14 9 11 16 21 13   CREATININE 1.12 1.08 0.97 1.18 1.14 1.20 0.95  CALCIUM 8.5* 8.9 9.1 9.1 9.0 8.2* 9.0  MG 1.9  --  2.3  --  2.6* 2.4 2.4  PHOS 2.0* 2.1*  --   --  3.0 3.0  --    Liver Function Tests: Recent Labs  Lab 02/10/24 1327 02/15/24 0238  AST 19  --   ALT 24  --   ALKPHOS 67  --   BILITOT 0.4  --   PROT 7.1  --   ALBUMIN 3.7 3.6   Recent Labs  Lab 02/10/24 1327  LIPASE 23   No results for input(s): AMMONIA in the last 168 hours. CBC: Recent Labs  Lab 02/12/24 0336 02/13/24 0253 02/14/24 0258 02/15/24 0238 02/16/24 0525  WBC 4.2 5.6 6.9 6.1 5.7  NEUTROABS  --   --  4.4  --   --   HGB 14.5 15.2 15.7 13.7 13.8  HCT 45.3 48.5 49.2 43.9 43.5  MCV 84.0 86.1 85.1 86.4 86.0  PLT 158 160 175 167 163   Cardiac Enzymes: No results for input(s): CKTOTAL, CKMB, CKMBINDEX, TROPONINI in the last 168 hours. BNP: BNP (last 3 results) No results for input(s): BNP in the last 8760 hours.  ProBNP (last 3 results) No results for input(s): PROBNP in the last 8760 hours.  CBG: Recent Labs  Lab 02/15/24 0752 02/15/24 1144 02/15/24 1801 02/16/24 0751 02/16/24 1135  GLUCAP 164* 105* 129* 130* 116*       Signed:  Toribio Hummer MD.  [facility] 02/16/2024, 2:56 PM

## 2024-02-17 ENCOUNTER — Other Ambulatory Visit (HOSPITAL_COMMUNITY): Payer: Self-pay

## 2024-03-08 ENCOUNTER — Ambulatory Visit (HOSPITAL_COMMUNITY): Admitting: Internal Medicine

## 2024-03-08 NOTE — Progress Notes (Signed)
 "  Primary Care Physician: Shelda Atlas, MD Primary Cardiologist: Vinie JAYSON Maxcy, MD Electrophysiologist: None  Referring Physician: Vinie JAYSON Maxcy, MD   Stanley Watkins is a 73 y.o. male with a history of PAF, HTN, dementia, schizoaffective disorder, prediabetes, who presents for follow up in the [facility].  The patient was initially diagnosed with A-fib on 02/10/2024 when he was admitted with complaint of nausea/vomiting, black stools and diarrhea.  EKG was completed showing new onset A-fib with RVR and rate as high as 158.  He was given 5 mg of IV Lopressor  and was started on Cardizem  drip along with fluid repletion.  He converted to sinus rhythm spontaneously overnight and underwent diagnostic EGD that showed no clear source of bleeding.  GI pathogen panel showed positive test for norovirus.  Echocardiogram on 02/11/2024 showed a normal LVEF of 55 to 60%, no RWMA, G1 DD, normal RV systolic function, and grossly normal valve function.  He continued to have episodes of paroxysmal A-fib with frequent PACs in bigeminal pattern with continued episodes of projectile vomiting.  He was started on Eliquis  and Cardizem  CD 360 mg daily.  He was discharged with 14-day ZIO to evaluate AF burden.  Stanley Watkins presents today with his wife for posthospital follow-up and to establish care in the A-fib clinic.  On exam today he is in sinus rhythm and reports no recurrence of atrial fibrillation.  He just completed a 14-day ZIO monitor with results pending.  He is aware of his arrhythmia and during today's visit we discussed possible treatment moving forward if A-fib occurs again.  He understands and agrees with the need to be on Eliquis  long-term due to stroke risk.  He has been compliant with his Eliquis  and denies any missed doses since initiating.  We reviewed further rhythm control and rate control strategies that may be needed if A-fib reoccurs based on the results of his ZIO  monitor.  We reviewed triggers and the need to pursue management of sleep apnea.  He had all questions answered regarding atrial fibrillation today and will contact our office if he has any further questions.  Today, he denies symptoms of palpitations, chest pain, shortness of breath, orthopnea, PND, lower extremity edema, dizziness, presyncope, syncope, snoring, daytime somnolence, bleeding, or neurologic sequela. The patient is tolerating medications without difficulties and is otherwise without complaint today.    Notes: -Does not use alcohol - Previously diagnosed with OSA  Atrial Fibrillation Management history:  Previous antiarrhythmic drugs: None Previous cardioversions: None Previous ablations: None Anticoagulation history: Eliquis   ROS- All systems are reviewed and negative except as per the HPI above.  Past Medical History:  Diagnosis Date   Dementia (HCC)    Depression, major    Diabetes mellitus without complication (HCC)    pre diabetes   Hypertension    non complaint   Schizoaffective disorder (HCC)     Current Outpatient Medications  Medication Sig Dispense Refill   apixaban  (ELIQUIS ) 5 MG TABS tablet Take 1 tablet (5 mg total) by mouth 2 (two) times daily. 60 tablet 2   [Paused] aspirin EC 81 MG tablet Take 81 mg by mouth daily. Swallow whole.     buPROPion  (WELLBUTRIN  XL) 150 MG 24 hr tablet Take 150 mg by mouth daily. (Patient not taking: Reported on 02/10/2024)     carvedilol  (COREG ) 6.25 MG tablet Take 1 tablet (6.25 mg total) by mouth 2 (two) times daily with a meal. 60 tablet 2  diltiazem  (CARDIZEM  CD) 360 MG 24 hr capsule Take 1 capsule (360 mg total) by mouth daily. 60 capsule 2   donepezil  (ARICEPT ) 10 MG tablet Take 10 mg by mouth daily.     gabapentin  (NEURONTIN ) 100 MG capsule Take 100 mg by mouth 2 (two) times daily.     losartan -hydrochlorothiazide  (HYZAAR) 100-25 MG tablet Take 1 tablet by mouth daily.     pantoprazole  (PROTONIX ) 40 MG tablet Take  1 tablet (40 mg total) by mouth daily. 30 tablet 0   Vitamin D , Ergocalciferol , (DRISDOL) 1.25 MG (50000 UNIT) CAPS capsule Take 50,000 Units by mouth once a week.     No current facility-administered medications for this visit.    Physical Exam: There were no vitals taken for this visit.  GEN: Well nourished, well developed in no acute distress NECK: No JVD; No carotid bruits CARDIAC: Regular rate and rhythm, no murmurs, rubs, gallops RESPIRATORY:  Clear to auscultation without rales, wheezing or rhonchi  ABDOMEN: Soft, non-tender, non-distended EXTREMITIES:  No edema; No deformity   Wt Readings from Last 3 Encounters:  02/15/24 123.4 kg  11/19/15 87.5 kg  09/21/14 85.1 kg     EKG today demonstrates:  EKG Interpretation Date/Time:  Wednesday March 09 2024 10:36:12 EST Ventricular Rate:  106 PR Interval:  156 QRS Duration:  86 QT Interval:  332 QTC Calculation: 441 R Axis:   -47  Text Interpretation: Sinus tachycardia with occasional Premature ventricular complexes Biatrial enlargement Left axis deviation Confirmed by Wyn Manus [phone]) on 03/09/2024 11:30:39 AM    Echo Completed 02/11/2024: 1. Left ventricular ejection fraction, by estimation, is 55 to 60%. The  left ventricle has normal function. Left ventricular endocardial border  not optimally defined to evaluate regional wall motion. Left ventricular  diastolic parameters are consistent  with Grade I diastolic dysfunction (impaired relaxation).   2. Right ventricular systolic function is normal. The right ventricular  size is normal.   3. The mitral valve is normal in structure. No evidence of mitral valve  regurgitation. No evidence of mitral stenosis.   4. The aortic valve was not well visualized. Aortic valve regurgitation  is not visualized. No aortic stenosis is present.    CHA2DS2-VASc Score = 3  The patient's score is based upon: CHF History: 0 HTN History: 1 Diabetes History: 1 Stroke History:  0 Vascular Disease History: 0 Age Score: 1 Gender Score: 0       ASSESSMENT AND PLAN: Paroxysmal Atrial Fibrillation (ICD10:  I48.0) The patient's CHA2DS2-VASc score is 3, indicating a 3.2% annual risk of stroke.   -  new onset atrial fibrillation and today maintaining sinus rhythm.  Patient reports no missed doses of Eliquis  and reviewed rhythm and rate control strategies in the future.  He has tolerated Eliquis  without any bleeding. -We will check CBC and 1 month -Continue Eliquis  5 mg twice daily - Continue Cardizem  360 mg daily and carvedilol  6.25 mg twice daily - Patient will follow-up in 6 months or sooner based on results of ZIO monitor.  HTN: BP well controlled. Continue current antihypertensive regimen.   OSA: - Previously diagnosed with OSA without CPAP follow-up - Ambulatory referral to [facility] sleep medicine  Secondary Hypercoagulable State (ICD10:  [phone]) The patient is at significant risk for stroke/thromboembolism based upon his CHA2DS2-VASc Score of 3.  Continue Apixaban  (Eliquis ).    Signed,  Wyn Raddle, Manus Shove, NP    03/09/2024 7:47 AM    Follow up with the AF Clinic in 6  months    "

## 2024-03-09 ENCOUNTER — Encounter (HOSPITAL_COMMUNITY): Payer: Self-pay | Admitting: Internal Medicine

## 2024-03-09 ENCOUNTER — Ambulatory Visit (HOSPITAL_COMMUNITY): Admission: RE | Admit: 2024-03-09 | Admitting: Internal Medicine

## 2024-03-09 VITALS — BP 118/78 | HR 106 | Ht 71.0 in | Wt 278.7 lb

## 2024-03-09 DIAGNOSIS — D6859 Other primary thrombophilia: Secondary | ICD-10-CM | POA: Diagnosis not present

## 2024-03-09 DIAGNOSIS — G4733 Obstructive sleep apnea (adult) (pediatric): Secondary | ICD-10-CM | POA: Diagnosis not present

## 2024-03-09 DIAGNOSIS — I10 Essential (primary) hypertension: Secondary | ICD-10-CM | POA: Diagnosis not present

## 2024-03-09 DIAGNOSIS — I480 Paroxysmal atrial fibrillation: Secondary | ICD-10-CM

## 2024-03-09 NOTE — Patient Instructions (Signed)
 Please get lab done in 1 month at [facility]  Someone from [facility] will reach out to schedule sleep study  Depending on monitor results will call for earlier follow-up

## 2024-03-09 NOTE — Addendum Note (Signed)
 Encounter addended by: Wyn Jackee VEAR Mickey., NP on: 03/09/2024 12:51 PM  Actions taken: In Basket message sent

## 2024-03-11 ENCOUNTER — Other Ambulatory Visit: Payer: Self-pay

## 2024-03-11 ENCOUNTER — Emergency Department (HOSPITAL_COMMUNITY)

## 2024-03-11 ENCOUNTER — Inpatient Hospital Stay (HOSPITAL_COMMUNITY)
Admission: EM | Admit: 2024-03-11 | Discharge: 2024-03-14 | DRG: 312 | Disposition: A | Discharge status: Home / Self Care | Attending: Internal Medicine | Admitting: Internal Medicine

## 2024-03-11 ENCOUNTER — Encounter (HOSPITAL_COMMUNITY): Payer: Self-pay | Admitting: Pulmonary Disease

## 2024-03-11 DIAGNOSIS — E119 Type 2 diabetes mellitus without complications: Secondary | ICD-10-CM | POA: Diagnosis present

## 2024-03-11 DIAGNOSIS — E876 Hypokalemia: Secondary | ICD-10-CM | POA: Diagnosis not present

## 2024-03-11 DIAGNOSIS — I952 Hypotension due to drugs: Secondary | ICD-10-CM | POA: Diagnosis present

## 2024-03-11 DIAGNOSIS — I493 Ventricular premature depolarization: Secondary | ICD-10-CM | POA: Diagnosis present

## 2024-03-11 DIAGNOSIS — Z79899 Other long term (current) drug therapy: Secondary | ICD-10-CM | POA: Diagnosis not present

## 2024-03-11 DIAGNOSIS — F03A0 Unspecified dementia, mild, without behavioral disturbance, psychotic disturbance, mood disturbance, and anxiety: Secondary | ICD-10-CM | POA: Diagnosis present

## 2024-03-11 DIAGNOSIS — Z7901 Long term (current) use of anticoagulants: Secondary | ICD-10-CM | POA: Diagnosis not present

## 2024-03-11 DIAGNOSIS — I480 Paroxysmal atrial fibrillation: Secondary | ICD-10-CM | POA: Diagnosis not present

## 2024-03-11 DIAGNOSIS — E785 Hyperlipidemia, unspecified: Secondary | ICD-10-CM | POA: Diagnosis present

## 2024-03-11 DIAGNOSIS — F251 Schizoaffective disorder, depressive type: Secondary | ICD-10-CM | POA: Diagnosis present

## 2024-03-11 DIAGNOSIS — Z6839 Body mass index (BMI) 39.0-39.9, adult: Secondary | ICD-10-CM | POA: Diagnosis not present

## 2024-03-11 DIAGNOSIS — T50995A Adverse effect of other drugs, medicaments and biological substances, initial encounter: Secondary | ICD-10-CM | POA: Diagnosis present

## 2024-03-11 DIAGNOSIS — I959 Hypotension, unspecified: Principal | ICD-10-CM | POA: Diagnosis present

## 2024-03-11 DIAGNOSIS — E66812 Obesity, class 2: Secondary | ICD-10-CM | POA: Diagnosis present

## 2024-03-11 DIAGNOSIS — R579 Shock, unspecified: Secondary | ICD-10-CM | POA: Diagnosis present

## 2024-03-11 DIAGNOSIS — N179 Acute kidney failure, unspecified: Secondary | ICD-10-CM | POA: Diagnosis present

## 2024-03-11 DIAGNOSIS — Z833 Family history of diabetes mellitus: Secondary | ICD-10-CM

## 2024-03-11 DIAGNOSIS — I10 Essential (primary) hypertension: Secondary | ICD-10-CM | POA: Diagnosis present

## 2024-03-11 LAB — CBC WITH DIFFERENTIAL/PLATELET
Abs Immature Granulocytes: 0.01 K/uL (ref 0.00–0.07)
Basophils Absolute: 0 K/uL (ref 0.0–0.1)
Basophils Relative: 0 %
Eosinophils Absolute: 0 K/uL (ref 0.0–0.5)
Eosinophils Relative: 1 %
HCT: 44.6 % (ref 39.0–52.0)
Hemoglobin: 14.5 g/dL (ref 13.0–17.0)
Immature Granulocytes: 0 %
Lymphocytes Relative: 30 %
Lymphs Abs: 1.6 K/uL (ref 0.7–4.0)
MCH: 27.4 pg (ref 26.0–34.0)
MCHC: 32.5 g/dL (ref 30.0–36.0)
MCV: 84.2 fL (ref 80.0–100.0)
Monocytes Absolute: 0.7 K/uL (ref 0.1–1.0)
Monocytes Relative: 14 %
Neutro Abs: 2.9 K/uL (ref 1.7–7.7)
Neutrophils Relative %: 55 %
Platelets: 146 K/uL — ABNORMAL LOW (ref 150–400)
RBC: 5.3 MIL/uL (ref 4.22–5.81)
RDW: 15.6 % — ABNORMAL HIGH (ref 11.5–15.5)
WBC: 5.2 K/uL (ref 4.0–10.5)
nRBC: 0 % (ref 0.0–0.2)

## 2024-03-11 LAB — URINALYSIS, W/ REFLEX TO CULTURE (INFECTION SUSPECTED)
Bacteria, UA: NONE SEEN
Bilirubin Urine: NEGATIVE
Glucose, UA: NEGATIVE mg/dL
Hgb urine dipstick: NEGATIVE
Ketones, ur: 5 mg/dL — AB
Leukocytes,Ua: NEGATIVE
Nitrite: NEGATIVE
Protein, ur: NEGATIVE mg/dL
Specific Gravity, Urine: 1.009 (ref 1.005–1.030)
pH: 5 (ref 5.0–8.0)

## 2024-03-11 LAB — COMPREHENSIVE METABOLIC PANEL WITH GFR
ALT: 34 U/L (ref 0–44)
AST: 35 U/L (ref 15–41)
Albumin: 3.7 g/dL (ref 3.5–5.0)
Alkaline Phosphatase: 69 U/L (ref 38–126)
Anion gap: 14 (ref 5–15)
BUN: 29 mg/dL — ABNORMAL HIGH (ref 8–23)
CO2: 24 mmol/L (ref 22–32)
Calcium: 9 mg/dL (ref 8.9–10.3)
Chloride: 97 mmol/L — ABNORMAL LOW (ref 98–111)
Creatinine, Ser: 1.87 mg/dL — ABNORMAL HIGH (ref 0.61–1.24)
GFR, Estimated: 38 mL/min — ABNORMAL LOW
Glucose, Bld: 152 mg/dL — ABNORMAL HIGH (ref 70–99)
Potassium: 3.9 mmol/L (ref 3.5–5.1)
Sodium: 136 mmol/L (ref 135–145)
Total Bilirubin: 0.4 mg/dL (ref 0.0–1.2)
Total Protein: 7.6 g/dL (ref 6.5–8.1)

## 2024-03-11 LAB — I-STAT CHEM 8, ED
BUN: 30 mg/dL — ABNORMAL HIGH (ref 8–23)
Calcium, Ion: 1.06 mmol/L — ABNORMAL LOW (ref 1.15–1.40)
Chloride: 99 mmol/L (ref 98–111)
Creatinine, Ser: 2 mg/dL — ABNORMAL HIGH (ref 0.61–1.24)
Glucose, Bld: 150 mg/dL — ABNORMAL HIGH (ref 70–99)
HCT: 45 % (ref 39.0–52.0)
Hemoglobin: 15.3 g/dL (ref 13.0–17.0)
Potassium: 3.7 mmol/L (ref 3.5–5.1)
Sodium: 137 mmol/L (ref 135–145)
TCO2: 23 mmol/L (ref 22–32)

## 2024-03-11 LAB — I-STAT CG4 LACTIC ACID, ED
Lactic Acid, Venous: 2.1 mmol/L (ref 0.5–1.9)
Lactic Acid, Venous: 3 mmol/L (ref 0.5–1.9)

## 2024-03-11 LAB — CBG MONITORING, ED: Glucose-Capillary: 133 mg/dL — ABNORMAL HIGH (ref 70–99)

## 2024-03-11 LAB — TYPE AND SCREEN
ABO/RH(D): AB POS
Antibody Screen: NEGATIVE

## 2024-03-11 LAB — PROTIME-INR
INR: 1.4 — ABNORMAL HIGH (ref 0.8–1.2)
Prothrombin Time: 18.1 s — ABNORMAL HIGH (ref 11.4–15.2)

## 2024-03-11 LAB — MRSA NEXT GEN BY PCR, NASAL: MRSA by PCR Next Gen: NOT DETECTED

## 2024-03-11 LAB — LACTIC ACID, PLASMA: Lactic Acid, Venous: 2.2 mmol/L (ref 0.5–1.9)

## 2024-03-11 LAB — PROCALCITONIN: Procalcitonin: 0.31 ng/mL

## 2024-03-11 MED ORDER — LACTATED RINGERS IV BOLUS (SEPSIS)
2000.0000 mL | Freq: Once | INTRAVENOUS | Status: AC
  Administered 2024-03-11: 2000 mL via INTRAVENOUS

## 2024-03-11 MED ORDER — DOXYCYCLINE HYCLATE 100 MG PO TABS
100.0000 mg | ORAL_TABLET | Freq: Two times a day (BID) | ORAL | Status: AC
  Administered 2024-03-11 – 2024-03-13 (×5): 100 mg via ORAL
  Filled 2024-03-11 (×5): qty 1

## 2024-03-11 MED ORDER — SODIUM CHLORIDE 0.9 % IV SOLN
250.0000 mL | INTRAVENOUS | Status: DC
  Administered 2024-03-11: 250 mL via INTRAVENOUS

## 2024-03-11 MED ORDER — NOREPINEPHRINE 4 MG/250ML-% IV SOLN: 0.0000 ug/min | INTRAVENOUS | Status: DC

## 2024-03-11 MED ORDER — INSULIN ASPART 100 UNIT/ML IJ SOLN
0.0000 [IU] | INTRAMUSCULAR | Status: DC
  Administered 2024-03-12 (×2): 2 [IU] via SUBCUTANEOUS
  Administered 2024-03-12: 3 [IU] via SUBCUTANEOUS
  Administered 2024-03-13 – 2024-03-14 (×4): 2 [IU] via SUBCUTANEOUS
  Administered 2024-03-14: 3 [IU] via SUBCUTANEOUS
  Filled 2024-03-11 (×2): qty 3
  Filled 2024-03-11 (×3): qty 2
  Filled 2024-03-11: qty 3
  Filled 2024-03-11: qty 2

## 2024-03-11 MED ORDER — CHLORHEXIDINE GLUCONATE CLOTH 2 % EX PADS
6.0000 | MEDICATED_PAD | Freq: Every day | CUTANEOUS | Status: DC
  Administered 2024-03-11 – 2024-03-14 (×3): 6 via TOPICAL

## 2024-03-11 MED ORDER — SODIUM CHLORIDE 0.9 % IV SOLN: 250.0000 mL | INTRAVENOUS | Status: DC

## 2024-03-11 MED ORDER — CALCIUM GLUCONATE-NACL 1-0.675 GM/50ML-% IV SOLN
1.0000 g | Freq: Once | INTRAVENOUS | Status: AC
  Administered 2024-03-11: 1000 mg via INTRAVENOUS
  Filled 2024-03-11: qty 50

## 2024-03-11 MED ORDER — LACTATED RINGERS IV BOLUS
1000.0000 mL | Freq: Once | INTRAVENOUS | Status: AC
  Administered 2024-03-11: 1000 mL via INTRAVENOUS

## 2024-03-11 MED ORDER — SENNA 8.6 MG PO TABS: 1.0000 | ORAL_TABLET | Freq: Two times a day (BID) | ORAL | Status: DC | PRN

## 2024-03-11 MED ORDER — ORAL CARE MOUTH RINSE: 15.0000 mL | OROMUCOSAL | Status: DC | PRN

## 2024-03-11 MED ORDER — HEPARIN (PORCINE) 25000 UT/250ML-% IV SOLN
950.0000 [IU]/h | INTRAVENOUS | Status: DC
  Administered 2024-03-11: 1400 [IU]/h via INTRAVENOUS
  Filled 2024-03-11 (×2): qty 250

## 2024-03-11 MED ORDER — NOREPINEPHRINE 4 MG/250ML-% IV SOLN
0.0000 ug/min | INTRAVENOUS | Status: DC
  Administered 2024-03-11: 10 ug/min via INTRAVENOUS

## 2024-03-11 MED ORDER — POLYETHYLENE GLYCOL 3350 17 G PO PACK: 17.0000 g | PACK | Freq: Every day | ORAL | Status: DC | PRN

## 2024-03-11 NOTE — ED Triage Notes (Addendum)
 Pt bib [facility] coming from home called out for shortness of breath. On EMS arrival, patient not short of breath but lethargic. Pt recently discharged from hospital where he was also in the ICU. Pt reports nausea but denies pain. Pt lethargic in triage but oriented. EMS reports giving 800mL NS and starting pt on epi gtt. GCS 14.  EMS VS: 78/42 70 HR a-fib 98% RA Cbg 163

## 2024-03-11 NOTE — H&P (Signed)
 "  NAME:  Stanley Watkins, MRN:  [id number], DOB:  [birth year], LOS: 0 ADMISSION DATE:  03/11/2024, CONSULTATION DATE:  03/11/24 REFERRING MD:  Ula CHIEF COMPLAINT:  Fatigue   History of Present Illness:  Stanley Watkins is a 74 y.o. male who has a PMH including but not limited to recently diagnosed A-fib on Eliquis , HTN, diabetes, dementia as well as recent admission 02/10/2024 through 02/16/2024 for aspiration pneumonia, norovirus (he had had some melena and black-colored emesis, initially felt to be GI bleed but this was ruled out on EGD 02/11/2024).  His A-fib was a new diagnosis on that admission it was felt to be secondary to norovirus.  He presented to [facility] ED on 03/11/2024 with dyspnea and fatigue.  He had apparently been complaining of nausea since the night prior but had no vomiting or diarrhea.  He denied any fevers, chills, sweats, chest pain, cough, abdominal pain.  He did complain of some dizziness and EMS felt that he was orthostatic.  His initial blood pressure in the field was 60 systolic for which he received 500 cc of fluid without any improvement.  He was started on an epi infusion and was transported to the ED.  While in the ED, he received 3 more liters of fluids still remained hypotensive.  He was subsequently started on norepinephrine infusion.  CXR was negative.  He was found to have mild AKI with serum creatinine 1.87, initial  lactate 2.1.  Pertinent  Medical History:  has Schizoaffective disorder (HCC); Depression; AKI (acute kidney injury); Hypernatremia; Polycythemia; Type 2 diabetes mellitus (HCC); Sinus tachycardia; Protein-calorie malnutrition, severe; Thrombocytopenia; GIB (gastrointestinal bleeding); Nausea & vomiting; Essential hypertension; Dementia without behavioral disturbance (HCC); Atrial fibrillation with rapid ventricular response (HCC); Paroxysmal atrial fibrillation (HCC); Hypoxia; Aspiration pneumonia (HCC); and Norovirus GI and GII detected on their  problem list.  Significant Hospital Events: Including procedures, antibiotic start and stop dates in addition to other pertinent events   1/2 admit  Interim History / Subjective:  Spoke with patient and spouse who was at bedside Just general fatigue for the last few days Decreased intake, no diarrhea, no constipation, no sick contacts Little cough, some shortness of breath Since about decreased intake, sleeping a lot He had a cardiology follow-up appointment during which he had complained about coughing, was advised to take over-the-counter Delsym  Objective:  Blood pressure 101/60, pulse 92, temperature 97.7 F (36.5 C), temperature source Oral, resp. rate 18, SpO2 98%.       No intake or output data in the 24 hours ending 03/11/24 1838 There were no vitals filed for this visit.   Physical Exam: General: Elderly, does not appear to be in distress Neuro: Awake alert oriented HEENT: Dry oral mucosa Cardiovascular: S1-S2 appreciated Lungs: Clear breath sounds bilaterally Abdomen: Soft, bowel sounds appreciated Musculoskeletal: No clubbing, no edema Skin: Skin is warm and dry GU:   I reviewed last 24 h vitals and pain scores, last 48 h intake and output, last 24 h labs and trends, and last 24 h imaging results.  Chest x-ray reviewed by myself showing no infiltrative process No significant leukocytosis Bump in his BUN/creatinine from baseline   Assessment & Plan:    Hypotension - no obvious source of concern for sepsis picture at this point.  He had a recent echocardiogram 02/11/2024 that revealed normal EF with G1DD.  At this point, suspect that his hypotension is primarily related to his recent initiation of multiple cardiac meds including amlodipine , carvedilol , diltiazem ,  hydrochlorothiazide . - Hold all home meds for now. - Continue NE as needed for goal MAP > 65. - Will hold further fluid boluses given his known G1DD. - Repeat lactate. - Will empirically start him on  doxycycline-will follow cultures, this can be de-escalated quickly if no other signs of infection -Obtain procalcitonin - Follow BCx's, add UA. - Add on PCT.  AKI - 2/2 above + hydrochlorothiazide  use. Hypocalcemia. - Hold home meds. - Supportive care. - 1g Ca gluconate. - Follow BMP.  Hx A.fib, HTN, HLD. - Hold PTA meds. - Heparin  gtt in lieu of PTA Apixaban  for now incase he were to require procedures etc (doubtful).  Hx DM. - SSI.  Hx Dementia, Depression, Schizoaffective disorder - Hold PTA Aricept , Neurontin  for now.   Labs   CBC: Recent Labs  Lab 03/11/24 1503 03/11/24 1504  WBC  --  5.2  NEUTROABS  --  2.9  HGB 15.3 14.5  HCT 45.0 44.6  MCV  --  84.2  PLT  --  146*    Basic Metabolic Panel: Recent Labs  Lab 03/11/24 1503 03/11/24 1504  NA 137 136  K 3.7 3.9  CL 99 97*  CO2  --  24  GLUCOSE 150* 152*  BUN 30* 29*  CREATININE 2.00* 1.87*  CALCIUM  --  9.0   GFR: Estimated Creatinine Clearance: 48.3 mL/min (A) (by C-G formula based on SCr of 1.87 mg/dL (H)). Recent Labs  Lab 03/11/24 1504  WBC 5.2  LATICACIDVEN 2.1*    Liver Function Tests: Recent Labs  Lab 03/11/24 1504  AST 35  ALT 34  ALKPHOS 69  BILITOT 0.4  PROT 7.6  ALBUMIN 3.7   No results for input(s): LIPASE, AMYLASE in the last 168 hours. No results for input(s): AMMONIA in the last 168 hours.  ABG    Component Value Date/Time   TCO2 23 03/11/2024 1503     Coagulation Profile: Recent Labs  Lab 03/11/24 1504  INR 1.4*    Cardiac Enzymes: No results for input(s): CKTOTAL, CKMB, CKMBINDEX, TROPONINI in the last 168 hours.  HbA1C: Hgb A1c MFr Bld  Date/Time Value Ref Range Status  02/10/2024 05:35 PM 7.2 (H) 4.8 - 5.6 % Final    Comment:    (NOTE)         Prediabetes: 5.7 - 6.4         Diabetes: >6.4         Glycemic control for adults with diabetes: <7.0   11/19/2015 04:31 AM 6.6 (H) 4.8 - 5.6 % Final    Comment:    (NOTE)          Pre-diabetes: 5.7 - 6.4         Diabetes: >6.4         Glycemic control for adults with diabetes: <7.0     CBG: No results for input(s): GLUCAP in the last 168 hours.  Review of Systems:   Fatigue, tiredness Cough, shortness of breath  Past Medical History:  He,  has a past medical history of Dementia (HCC), Depression, major, Diabetes mellitus without complication (HCC), Hypertension, and Schizoaffective disorder (HCC).   Surgical History:   Past Surgical History:  Procedure Laterality Date   ESOPHAGOGASTRODUODENOSCOPY N/A 02/11/2024   Procedure: EGD (ESOPHAGOGASTRODUODENOSCOPY);  Surgeon: Saintclair Jasper, MD;  Location: [facility];  Service: Gastroenterology;  Laterality: N/A;     Social History:   reports that he has never smoked. He has never used smokeless tobacco. He reports that he  does not currently use drugs. He reports that he does not drink alcohol.   Family History:  His family history includes Cancer in his father; Diabetes in his mother; Schizophrenia in his maternal aunt.   Allergies Allergies[1]   The patient is critically ill with multiple organ systems failure and requires high complexity decision making for assessment and support, frequent evaluation and titration of therapies, application of advanced monitoring technologies and extensive interpretation of multiple databases. Critical Care Time devoted to patient care services described in this note independent of APP/resident time (if applicable)  is 30 minutes.   Jennet Epley MD [facility] Personal pager: See Amion If unanswered, please page CCM On-call: #[phone]      [1] No Known Allergies  "

## 2024-03-11 NOTE — ED Notes (Signed)
 Ellouise, DO notified of patient arrival and unstable vital signs.

## 2024-03-11 NOTE — ED Provider Notes (Signed)
 I was asked to follow-up on the patient's workup.  Patient presented today with hypotension.  Has been afebrile at home.  Patient was started on peripheral pressors in the field for blood pressures in the 60s systolic.  Was converted to peripheral Levophed  here.  I was called by nursing staff to reevaluate the patient initially as his blood pressures were in the 60s.  On my assessment with half a liter and so far the patient does have radial pulses and is mentating at his baseline.  He has blood pressures in the 90s systolic.  Will continue fluid resuscitation.  Initial i-STAT hemoglobin is normal so we will hold off on blood administration at this time.  Physical Exam  BP (!) 69/54   Pulse 72   Temp 97.7 F (36.5 C) (Oral)   Resp 11   SpO2 97%   Physical Exam  Procedures  Procedures  ED Course / MDM   Clinical Course as of 03/11/24 1521  Fri Mar 11, 2024  1520 Patient signed out to Dr. Ula pending work up with plan for admission.  [VK]    Clinical Course User Index [VK] Kingsley, Victoria K, DO   Medical Decision Making Amount and/or Complexity of Data Reviewed Labs: ordered. Radiology: ordered.  Risk Prescription drug management. Decision regarding hospitalization.   The patient's blood pressure initially responded to fluids but did drop down to maps of less than 65.  He was started back on peripheral Levophed .  A call is placed to the ICU team for admission.  He did not have any fever or obvious signs of infection here in the emergency department.  CRITICAL CARE Performed by: Prentice JONELLE Ula   Total critical care time: 35 minutes  Critical care time was exclusive of separately billable procedures and treating other patients.  Critical care was necessary to treat or prevent imminent or life-threatening deterioration.  Critical care was time spent personally by me on the following activities: development of treatment plan with patient and/or surrogate as well as nursing,  discussions with consultants, evaluation of patient's response to treatment, examination of patient, obtaining history from patient or surrogate, ordering and performing treatments and interventions, ordering and review of laboratory studies, ordering and review of radiographic studies, pulse oximetry and re-evaluation of patient's condition.        Ula Prentice JONELLE, MD 03/12/24 [phone]

## 2024-03-11 NOTE — Progress Notes (Addendum)
 eLink Physician-Brief Progress Note Patient Name: Stanley Watkins DOB: [birth year] MRN: [id number]   Date of Service  03/11/2024  HPI/Events of Note  55M dementia, afib, HTN, DM, recent admission for pna/norovirus who p/w decreased PO intake, DOE and fatigue on 1/2 and found to be hypotensive by EMS. Started on epi gtt and had refractory hypotension despite 3L IVF. Transitioned to levophed gtt and found with AKI.  eICU Interventions  Hypotension - pressor support in the ED. Weaned off at 6 pm. Recent antihypertensive agents started  Likely be able to transfer out of ICU tomorrow AM  TRH will pick up in am   10:20 PM LA 2.2 Improved. Remains off pressors  Intervention Category Evaluation Type: New Patient Evaluation  [name] Staff 03/11/2024, 9:17 PM

## 2024-03-11 NOTE — Progress Notes (Signed)
 PHARMACY - ANTICOAGULATION CONSULT NOTE  Pharmacy Consult for heparin , Eliquis  PTA  Indication: atrial fibrillation  Allergies[1]  Patient Measurements: Heparin  dosing weight 103.8kg     Vital Signs: Temp: 97.7 F (36.5 C) (01/02 1440) Temp Source: Oral (01/02 1440) BP: 101/60 (01/02 1830) Pulse Rate: 92 (01/02 1830)  Labs: Recent Labs    03/11/24 1503 03/11/24 1504  HGB 15.3 14.5  HCT 45.0 44.6  PLT  --  146*  LABPROT  --  18.1*  INR  --  1.4*  CREATININE 2.00* 1.87*    Estimated Creatinine Clearance: 48.3 mL/min (A) (by C-G formula based on SCr of 1.87 mg/dL (H)).   Medical History: Past Medical History:  Diagnosis Date   Dementia (HCC)    Depression, major    Diabetes mellitus without complication (HCC)    pre diabetes   Hypertension    non complaint   Schizoaffective disorder (HCC)    Assessment: Patient admitted with CC of dyspnea and fatigue. Found to be hypotensive requiring vasopressors, suspected due to several BP agents. PMH includes Afib on Eliquis  PTA, last dose 1/2 @ 09:00. Heparin  GTT in the interim in case of procedures per CCM.    Goal of Therapy:  Heparin  level 0.3-0.7 units/ml Monitor platelets by anticoagulation protocol: Yes aPTT 66-102    Plan:  No bolus given recent DOAC intake.  Start heparin  infusion at 1400 units/hr, will time to start tonight at 21:00.  Check aPTT level in 8 hours and daily while on heparin  Continue to monitor H&H and platelets F/u ability to transition back to Eliquis  - suspect can be done quickly.   Powell Blush, PharmD, BCCCP  03/11/2024,6:44 PM      [1] No Known Allergies

## 2024-03-11 NOTE — Plan of Care (Signed)

## 2024-03-11 NOTE — ED Provider Notes (Signed)
 " [place] EMERGENCY DEPARTMENT AT [facility] Provider Note   CSN: [id number] Arrival date & time: 03/11/24  1435     Patient presents with: Fatigue   Stanley Watkins is a 74 y.o. male.   Patient is a 74 year old male with past medical history of recently diagnosed A-fib on Eliquis , hypertension, diabetes, dementia and recent admission for norovirus, aspiration pneumonia and GI bleed presenting to the emergency department with lethargy and fatigue.  Per EMS, they were initially called out to the house for shortness of breath however on their arrival there the complaint was more lethargy and fatigue.  They report that his wife stated that he had been nauseous since last night but denied any vomiting or diarrhea.  The patient denies any fevers or abdominal pain.  They state that he was complaining of dizziness and did seem to be orthostatic for them.  They state that his initial blood pressure was in the 60s and they gave him a 500 cc fluid bolus without any improvement and started him on epi drip with improvement of blood pressures to the 70s.  The patient denies having any recent black or bloody stools that he has noticed.  The history is provided by the patient and the EMS personnel. History limited by: Dementia.       Prior to Admission medications  Medication Sig Start Date End Date Taking? Authorizing Provider  apixaban  (ELIQUIS ) 5 MG TABS tablet Take 1 tablet (5 mg total) by mouth 2 (two) times daily. 02/16/24   Sebastian Toribio GAILS, MD  buPROPion  (WELLBUTRIN  XL) 150 MG 24 hr tablet Take 150 mg by mouth daily. 08/02/23   [provider]  carvedilol  (COREG ) 6.25 MG tablet Take 1 tablet (6.25 mg total) by mouth 2 (two) times daily with a meal. 02/16/24   Sebastian Toribio GAILS, MD  diltiazem  (CARDIZEM  CD) 360 MG 24 hr capsule Take 1 capsule (360 mg total) by mouth daily. 02/17/24   Sebastian Toribio GAILS, MD  donepezil  (ARICEPT ) 10 MG tablet Take 10 mg by mouth daily.  12/23/23   [provider]  gabapentin  (NEURONTIN ) 100 MG capsule Take 100 mg by mouth 2 (two) times daily.    [provider]  losartan -hydrochlorothiazide  (HYZAAR) 100-25 MG tablet Take 1 tablet by mouth daily.    [provider]  pantoprazole  (PROTONIX ) 40 MG tablet Take 1 tablet (40 mg total) by mouth daily. 02/17/24   Sebastian Toribio GAILS, MD  Vitamin D , Ergocalciferol , (DRISDOL) 1.25 MG (50000 UNIT) CAPS capsule Take 50,000 Units by mouth once a week. 12/01/23   [provider]    Allergies: Patient has no known allergies.    Review of Systems  Updated Vital Signs BP 93/66   Pulse 90   Temp 97.7 F (36.5 C) (Oral)   Resp 15   SpO2 98%   Physical Exam Vitals and nursing note reviewed.  Constitutional:      General: He is not in acute distress.    Appearance: Normal appearance. He is ill-appearing.  HENT:     Head: Normocephalic and atraumatic.     Nose: Nose normal.     Mouth/Throat:     Mouth: Mucous membranes are moist.     Pharynx: Oropharynx is clear.  Eyes:     Extraocular Movements: Extraocular movements intact.     Conjunctiva/sclera: Conjunctivae normal.  Cardiovascular:     Rate and Rhythm: Normal rate. Rhythm irregular.     Heart sounds: Normal heart sounds.  Pulmonary:     Effort: Pulmonary effort is normal.     Breath sounds: Normal breath sounds.  Abdominal:     General: Abdomen is flat.     Palpations: Abdomen is soft.     Tenderness: There is no abdominal tenderness.  Musculoskeletal:        General: Normal range of motion.     Cervical back: Normal range of motion.  Skin:    General: Skin is dry.     Comments: Cool extremities  Neurological:     General: No focal deficit present.     Mental Status: He is alert.     Comments: Oriented to person and place; knows it is winter but does not know month/year  Psychiatric:        Mood and Affect: Mood normal.        Behavior: Behavior normal.     (all labs  ordered are listed, but only abnormal results are displayed) Labs Reviewed  CULTURE, BLOOD (ROUTINE X 2)  CULTURE, BLOOD (ROUTINE X 2)  COMPREHENSIVE METABOLIC PANEL WITH GFR  CBC WITH DIFFERENTIAL/PLATELET  PROTIME-INR  URINALYSIS, W/ REFLEX TO CULTURE (INFECTION SUSPECTED)  I-STAT CG4 LACTIC ACID, ED  I-STAT CHEM 8, ED  TYPE AND SCREEN    EKG: EKG Interpretation Date/Time:  Friday March 11 2024 15:16:35 EST Ventricular Rate:  87 PR Interval:    QRS Duration:  102 QT Interval:  381 QTC Calculation: 459 R Axis:   -46  Text Interpretation: Atrial fibrillation Left anterior fascicular block Abnormal R-wave progression, early transition Nonspecific T abnormalities, diffuse leads Confirmed by Ula Barter [phone]) on 03/11/2024 3:20:26 PM  Radiology: No results found.   .Critical Care  Performed by: Kingsley, Tabathia Knoche K, DO Authorized by: Ellouise Richerd POUR, DO   Critical care provider statement:    Critical care time (minutes):  30   Critical care was necessary to treat or prevent imminent or life-threatening deterioration of the following conditions:  Shock   Critical care was time spent personally by me on the following activities:  Development of treatment plan with patient or surrogate, discussions with consultants, evaluation of patient's response to treatment, examination of patient, ordering and review of laboratory studies, ordering and review of radiographic studies, ordering and performing treatments and interventions, pulse oximetry, re-evaluation of patient's condition and review of old charts    Medications Ordered in the ED  lactated ringers  bolus 2,000 mL (2,000 mLs Intravenous New Bag/Given 03/11/24 1510)  0.9 %  sodium chloride  infusion (250 mLs Intravenous New Bag/Given 03/11/24 1458)  norepinephrine (LEVOPHED) 4mg  in (0.016 mg/mL) premix infusion (10 mcg/min Intravenous New Bag/Given 03/11/24 1459)    Clinical Course as of 03/11/24 1520  Fri Mar 11, 2024   1520 Patient signed out to Dr. Ula pending work up with plan for admission.  [VK]    Clinical Course User Index [VK] Kingsley, Bartolo Montanye K, DO                                 Medical Decision Making This patient presents to the ED with chief complaint(s) of fatigue with pertinent past medical history of A fib on Eliquis , HTN, dementia, DM which further complicates the presenting complaint. The complaint involves an extensive differential diagnosis and also carries with it a high risk of complications and morbidity.    The differential diagnosis includes patient presents with undifferentiated shock, concerning for anemia/GI bleed, sepsis,  dehydration, electrolyte derangement, arrhythmia  Additional history obtained: Additional history obtained from EMS  Records reviewed previous admission documents  ED Course and Reassessment: On patient's arrival he is hypotensive to the 80s and otherwise stable in no acute distress and does appear to be at his neurologic baseline.  IV access was obtained and patient was started on IV fluids as well as Levophed drip.  Unclear etiology of his shock at this time and will have labs to evaluate for possible sepsis versus anemia with his recent admission for GI bleed.  Patient will be closely reassessed.  Independent labs interpretation:  Pending  Independent visualization of imaging: - Pending    Amount and/or Complexity of Data Reviewed Labs: ordered. Radiology: ordered.  Risk Prescription drug management.       Final diagnoses:  None    ED Discharge Orders     None          Ellouise Richerd POUR, DO 03/11/24 1520  "

## 2024-03-12 DIAGNOSIS — I952 Hypotension due to drugs: Secondary | ICD-10-CM

## 2024-03-12 LAB — GLUCOSE, CAPILLARY
Glucose-Capillary: 152 mg/dL — ABNORMAL HIGH (ref 70–99)
Glucose-Capillary: 137 mg/dL — ABNORMAL HIGH (ref 70–99)
Glucose-Capillary: 132 mg/dL — ABNORMAL HIGH (ref 70–99)
Glucose-Capillary: 120 mg/dL — ABNORMAL HIGH (ref 70–99)

## 2024-03-12 LAB — APTT
aPTT: 133 s — ABNORMAL HIGH (ref 24–36)
aPTT: 99 s — ABNORMAL HIGH (ref 24–36)

## 2024-03-12 LAB — CBC
HCT: 39.8 % (ref 39.0–52.0)
Hemoglobin: 13.2 g/dL (ref 13.0–17.0)
MCH: 27.4 pg (ref 26.0–34.0)
MCHC: 33.2 g/dL (ref 30.0–36.0)
MCV: 82.7 fL (ref 80.0–100.0)
Platelets: 119 K/uL — ABNORMAL LOW (ref 150–400)
RBC: 4.81 MIL/uL (ref 4.22–5.81)
RDW: 15.4 % (ref 11.5–15.5)
WBC: 3.8 K/uL — ABNORMAL LOW (ref 4.0–10.5)
nRBC: 0 % (ref 0.0–0.2)

## 2024-03-12 LAB — MAGNESIUM: Magnesium: 1.9 mg/dL (ref 1.7–2.4)

## 2024-03-12 LAB — BASIC METABOLIC PANEL WITH GFR
Anion gap: 13 (ref 5–15)
BUN: 27 mg/dL — ABNORMAL HIGH (ref 8–23)
CO2: 22 mmol/L (ref 22–32)
Calcium: 8.7 mg/dL — ABNORMAL LOW (ref 8.9–10.3)
Chloride: 100 mmol/L (ref 98–111)
Creatinine, Ser: 1.34 mg/dL — ABNORMAL HIGH (ref 0.61–1.24)
GFR, Estimated: 56 mL/min — ABNORMAL LOW
Glucose, Bld: 120 mg/dL — ABNORMAL HIGH (ref 70–99)
Potassium: 3.4 mmol/L — ABNORMAL LOW (ref 3.5–5.1)
Sodium: 135 mmol/L (ref 135–145)

## 2024-03-12 LAB — PHOSPHORUS: Phosphorus: 3.3 mg/dL (ref 2.5–4.6)

## 2024-03-12 MED ORDER — APIXABAN 5 MG PO TABS
5.0000 mg | ORAL_TABLET | Freq: Two times a day (BID) | ORAL | Status: DC
  Administered 2024-03-12 – 2024-03-14 (×5): 5 mg via ORAL
  Filled 2024-03-12 (×5): qty 1

## 2024-03-12 MED ORDER — DONEPEZIL HCL 10 MG PO TABS
10.0000 mg | ORAL_TABLET | Freq: Every day | ORAL | Status: DC
  Administered 2024-03-12 – 2024-03-14 (×3): 10 mg via ORAL
  Filled 2024-03-12: qty 2
  Filled 2024-03-12 (×2): qty 1

## 2024-03-12 MED ORDER — CARVEDILOL 6.25 MG PO TABS: 6.2500 mg | ORAL_TABLET | Freq: Two times a day (BID) | ORAL | Status: DC

## 2024-03-12 MED ORDER — POTASSIUM CHLORIDE CRYS ER 20 MEQ PO TBCR
40.0000 meq | EXTENDED_RELEASE_TABLET | Freq: Once | ORAL | Status: AC
  Administered 2024-03-12: 40 meq via ORAL
  Filled 2024-03-12: qty 2

## 2024-03-12 MED ORDER — MAGNESIUM SULFATE 2 GM/50ML IV SOLN
2.0000 g | Freq: Once | INTRAVENOUS | Status: AC
  Administered 2024-03-12: 2 g via INTRAVENOUS
  Filled 2024-03-12: qty 50

## 2024-03-12 MED ORDER — PANTOPRAZOLE SODIUM 40 MG PO TBEC
40.0000 mg | DELAYED_RELEASE_TABLET | Freq: Every day | ORAL | Status: DC
  Administered 2024-03-12 – 2024-03-14 (×3): 40 mg via ORAL
  Filled 2024-03-12 (×3): qty 1

## 2024-03-12 MED ORDER — CARVEDILOL 3.125 MG PO TABS
3.1250 mg | ORAL_TABLET | Freq: Two times a day (BID) | ORAL | Status: DC
  Administered 2024-03-12: 3.125 mg via ORAL
  Filled 2024-03-12 (×2): qty 1

## 2024-03-12 MED ORDER — GABAPENTIN 100 MG PO CAPS
100.0000 mg | ORAL_CAPSULE | Freq: Two times a day (BID) | ORAL | Status: DC
  Administered 2024-03-12 – 2024-03-14 (×5): 100 mg via ORAL
  Filled 2024-03-12 (×5): qty 1

## 2024-03-12 MED ORDER — BUPROPION HCL ER (XL) 150 MG PO TB24
150.0000 mg | ORAL_TABLET | Freq: Every day | ORAL | Status: DC
  Administered 2024-03-12 – 2024-03-14 (×3): 150 mg via ORAL
  Filled 2024-03-12 (×4): qty 1

## 2024-03-12 NOTE — Progress Notes (Addendum)
 PHARMACY - ANTICOAGULATION CONSULT NOTE  Pharmacy Consult for heparin , Eliquis  PTA  Indication: atrial fibrillation  Allergies[1]  Patient Measurements: Heparin  dosing weight 103.8kg  Height: 5' 10.98 (180.3 cm) Weight: 127 kg (279 lb 15.8 oz) IBW/kg (Calculated) : 75.26 HEPARIN  DW (KG): 103.8  Vital Signs: Temp: 97.8 F (36.6 C) (01/03 1132) Temp Source: Oral (01/03 1132) BP: 139/91 (01/03 0900) Pulse Rate: 51 (01/03 1132)  Labs: Recent Labs    03/11/24 1503 03/11/24 1504 03/12/24 0253 03/12/24 1144  HGB 15.3 14.5 13.2  --   HCT 45.0 44.6 39.8  --   PLT  --  146* 119*  --   APTT  --   --  133* 99*  LABPROT  --  18.1*  --   --   INR  --  1.4*  --   --   CREATININE 2.00* 1.87* 1.34*  --     Estimated Creatinine Clearance: 67.7 mL/min (A) (by C-G formula based on SCr of 1.34 mg/dL (H)).   Medical History: Past Medical History:  Diagnosis Date   Dementia (HCC)    Depression, major    Diabetes mellitus without complication (HCC)    pre diabetes   Hypertension    non complaint   Schizoaffective disorder (HCC)    Assessment: Patient admitted with CC of dyspnea and fatigue. Found to be hypotensive requiring vasopressors, suspected due to several BP agents. PMH includes Afib on Eliquis  PTA, last dose 1/2 @ 09:00. Heparin  GTT in the interim in case of procedures per CCM.   1/3: aPTT therapeutic at upper end of 99 after recent decrease to 1000 units/hr.    Goal of Therapy:  Heparin  level 0.3-0.7 units/ml Monitor platelets by anticoagulation protocol: Yes aPTT 66-102    Plan:  Decrease heparin  infusion to 950 units/hr Check confirmatory aPTT level in 8 hours and daily while on heparin  Continue to monitor H&H and platelets F/u ability to transition back to Eliquis   Rankin Sams, PharmD, BCPS, BCCCP Clinical Pharmacist   Addendum:  Pharmacy consulted to dose Eliquis   Stop heparin  now and resume apixaban  5 mg BID   Rankin Sams, PharmD, BCPS,  BCCCP Clinical Pharmacist     [1] No Known Allergies

## 2024-03-12 NOTE — Progress Notes (Signed)
 PHARMACY - ANTICOAGULATION CONSULT NOTE  Pharmacy Consult for heparin , Eliquis  PTA  Indication: atrial fibrillation  Allergies[1]  Patient Measurements: Heparin  dosing weight 103.8kg  Height: 5' 10.98 (180.3 cm) Weight: 126.4 kg (278 lb 10.6 oz) IBW/kg (Calculated) : 75.26 HEPARIN  DW (KG): 103.8  Vital Signs: Temp: 98.1 F (36.7 C) (01/02 2300) Temp Source: Oral (01/02 2300) BP: 111/73 (01/03 0300) Pulse Rate: 74 (01/03 0300)  Labs: Recent Labs    03/11/24 1503 03/11/24 1504 03/12/24 0253  HGB 15.3 14.5 13.2  HCT 45.0 44.6 39.8  PLT  --  146* 119*  APTT  --   --  133*  LABPROT  --  18.1*  --   INR  --  1.4*  --   CREATININE 2.00* 1.87*  --     Estimated Creatinine Clearance: 48.3 mL/min (A) (by C-G formula based on SCr of 1.87 mg/dL (H)).   Medical History: Past Medical History:  Diagnosis Date   Dementia (HCC)    Depression, major    Diabetes mellitus without complication (HCC)    pre diabetes   Hypertension    non complaint   Schizoaffective disorder (HCC)    Assessment: Patient admitted with CC of dyspnea and fatigue. Found to be hypotensive requiring vasopressors, suspected due to several BP agents. PMH includes Afib on Eliquis  PTA, last dose 1/2 @ 09:00. Heparin  GTT in the interim in case of procedures per CCM.   AM: aPTT supra-therapeutic on 1400 units/hr. Per RN, drawn appropriately with no signs/symptoms of bleeding. CBC shows Hgb 14.5 > 13.2, plts 146 >119   Goal of Therapy:  Heparin  level 0.3-0.7 units/ml Monitor platelets by anticoagulation protocol: Yes aPTT 66-102    Plan:  Decrease heparin  infusion to 1000 units/hr Check aPTT level in 8 hours and daily while on heparin  Continue to monitor H&H and platelets F/u ability to transition back to Eliquis   Lynwood Poplar, PharmD, BCPS Clinical Pharmacist 03/12/2024 3:56 AM         [1] No Known Allergies

## 2024-03-12 NOTE — Plan of Care (Signed)

## 2024-03-12 NOTE — Progress Notes (Signed)
 " PROGRESS NOTE  Stanley Watkins FMW:[id number] DOB: Aug 02, 1950 DOA: 03/11/2024 PCP: Shelda Atlas, MD   LOS: 1 day   Brief Narrative / Interim history: 74 year old male with A-fib on Eliquis , HTN, DM2, mild dementia, recent admission in December 2025 for aspiration pneumonia, norovirus, melena status post EGD which ruled out a GI bleed, comes into the hospital with dyspnea and fatigue.  He tells me that he was extremely dizzy.  He was hypotensive with a blood pressure in the field of 60 systolic, received fluids without improvement.  He received 3 more liters of fluids in the ED, without improvement, was placed on Levophed  and admitted to the ICU.  Hypotension resolved, Levophed  was weaned off and TRH was asked to take over on 1/3, however I do not see any transfer orders out of the ICU  Subjective / 24h Interval events: He is doing well this morning, complains of some abdominal gas, no nausea or vomiting.  Denies any dizziness.  Assesement and Plan: Principal problem Hypotension, shock -etiology not entirely clear, he is not septic appearing.  Most recent 2D echocardiogram in December 2025 showed normal EF with grade 1 diastolic dysfunction.  He did develop A-fib during prior hospitalization, and was started on medications for that.  He appears to be on Coreg , amlodipine , diltiazem , along with HCTZ alone and losartan  HCTZ combo.  Per discharge summary in December he was supposed to be only on losartan /HCTZ and amlodipine  was recommended to be discontinued - Per pharmacy reconciliation he appears to be taking all of this medication, including duplicate HCTZ.  At this point, this appears to be the most likely etiology for his hypotension, overmedication. - Obtain orthostatics.  Will try to mobilize with PT if able today  Active problems PAF -currently appears to be in sinus rhythm with rates into the 50s-60s on the monitor.  Will see what his blood pressure does over the next 24 hours and  gently reintroduce rate controlling agents.  He was on diltiazem  and Coreg , may need to be on just one of them versus switching to metoprolol  alone as it does not affect the blood pressure too much - Eliquis  is on hold and he was placed on heparin  infusion.  If he has good p.o. intake and tolerating food, will place back on Eliquis   Acute kidney injury -patient's most recent creatinine during his prior hospitalization ranged tween 0.9 and 1.2, creatinine during this admission at 2.0.  It is now improving  DM 2 -continue sliding scale  Essential hypertension-now hypotensive, hold medications and monitor blood pressure trends.  Obtain orthostatics.  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  He would benefit from weight loss  Scheduled Meds:  Chlorhexidine  Gluconate Cloth  6 each Topical Daily   doxycycline   100 mg Oral Q12H   insulin  aspart  0-15 Units Subcutaneous Q4H   potassium chloride   40 mEq Oral Once   Continuous Infusions:  sodium chloride  Stopped (03/11/24 1920)   heparin  1,000 Units/hr (03/12/24 0700)   norepinephrine  (LEVOPHED ) Adult infusion Stopped (03/11/24 1858)   PRN Meds:.mouth rinse, polyethylene glycol, senna  Current Outpatient Medications  Medication Instructions   amLODipine  (NORVASC ) 10 mg, Daily   buPROPion  (WELLBUTRIN  XL) 150 mg, Daily   carvedilol  (COREG ) 6.25 mg, Oral, 2 times daily with meals   diltiazem  (CARDIZEM  CD) 360 mg, Oral, Daily   donepezil  (ARICEPT ) 10 mg, Daily   Eliquis  5 mg, Oral, 2 times daily   gabapentin  (NEURONTIN )  100 mg, 2 times daily   hydrochlorothiazide  (HYDRODIURIL ) 25 mg, Daily   losartan -hydrochlorothiazide  (HYZAAR) 100-25 MG tablet 1 tablet, Daily   pantoprazole  (PROTONIX ) 40 mg, Oral, Daily   Vitamin D  (Ergocalciferol ) (DRISDOL) 50,000 Units, Weekly    Diet Orders (From admission, onward)     Start     Ordered   03/11/24 1843  Diet Heart Room service  appropriate? Yes; Fluid consistency: Thin  Diet effective now       Question Answer Comment  Room service appropriate? Yes   Fluid consistency: Thin      03/11/24 1843            DVT prophylaxis: SCDs Start: 03/11/24 1842   Lab Results  Component Value Date   PLT 119 (L) 03/12/2024      Code Status: Full Code  Family Communication: No family at bedside  Status is: Inpatient Remains inpatient appropriate because: Severity of illness   Level of care: ICU  Consultants:  None  Objective: Vitals:   03/12/24 0500 03/12/24 0600 03/12/24 0700 03/12/24 0704  BP: (!) 130/90 (!) 131/99  122/84  Pulse: 90 74 71 62  Resp: (!) 22 15 (!) 23 13  Temp:      TempSrc:      SpO2: 93% 93% 94% 91%  Weight:      Height:        Intake/Output Summary (Last 24 hours) at 03/12/2024 0743 Last data filed at 03/12/2024 0700 Gross per 24 hour  Intake 1687.36 ml  Output 550 ml  Net 1137.36 ml   Wt Readings from Last 3 Encounters:  03/12/24 127 kg  03/09/24 126.4 kg  02/15/24 123.4 kg    Examination:  Constitutional: NAD Eyes: no scleral icterus ENMT: Mucous membranes are moist.  Neck: normal, supple Respiratory: clear to auscultation bilaterally, no wheezing, no crackles. Normal respiratory effort. No accessory muscle use.  Cardiovascular: Regular rate and rhythm, no murmurs / rubs / gallops.  Trace LE edema.  Abdomen: non distended, no tenderness. Bowel sounds positive.  Musculoskeletal: no clubbing / cyanosis.    Data Reviewed: I have independently reviewed following labs and imaging studies   CBC Recent Labs  Lab 03/11/24 1503 03/11/24 1504 03/12/24 0253  WBC  --  5.2 3.8*  HGB 15.3 14.5 13.2  HCT 45.0 44.6 39.8  PLT  --  146* 119*  MCV  --  84.2 82.7  MCH  --  27.4 27.4  MCHC  --  32.5 33.2  RDW  --  15.6* 15.4  LYMPHSABS  --  1.6  --   MONOABS  --  0.7  --   EOSABS  --  0.0  --   BASOSABS  --  0.0  --     Recent Labs  Lab 03/11/24 1503 03/11/24 1504  03/11/24 1912 03/11/24 2134 03/12/24 0253  NA 137 136  --   --  135  K 3.7 3.9  --   --  3.4*  CL 99 97*  --   --  100  CO2  --  24  --   --  22  GLUCOSE 150* 152*  --   --  120*  BUN 30* 29*  --   --  27*  CREATININE 2.00* 1.87*  --   --  1.34*  CALCIUM   --  9.0  --   --  8.7*  AST  --  35  --   --   --   ALT  --  34  --   --   --   ALKPHOS  --  69  --   --   --   BILITOT  --  0.4  --   --   --   ALBUMIN  --  3.7  --   --   --   MG  --   --   --   --  1.9  PROCALCITON  --   --   --  0.31  --   LATICACIDVEN  --  2.1* 3.0* 2.2*  --   INR  --  1.4*  --   --   --     ------------------------------------------------------------------------------------------------------------------ No results for input(s): CHOL, HDL, LDLCALC, TRIG, CHOLHDL, LDLDIRECT in the last 72 hours.  Lab Results  Component Value Date   HGBA1C 7.2 (H) 02/10/2024   ------------------------------------------------------------------------------------------------------------------ No results for input(s): TSH, T4TOTAL, T3FREE, THYROIDAB in the last 72 hours.  Invalid input(s): FREET3  Cardiac Enzymes No results for input(s): CKMB, TROPONINI, MYOGLOBIN in the last 168 hours.  Invalid input(s): CK ------------------------------------------------------------------------------------------------------------------ No results found for: BNP  CBG: Recent Labs  Lab 03/11/24 1958  GLUCAP 133*    Recent Results (from the past 240 hours)  MRSA Next Gen by PCR, Nasal     Status: None   Collection Time: 03/11/24  6:42 PM   Specimen: Nasal Mucosa; Nasal Swab  Result Value Ref Range Status   MRSA by PCR Next Gen NOT DETECTED NOT DETECTED Final    Comment: (NOTE) The GeneXpert MRSA Assay (FDA approved for NASAL specimens only), is one component of a comprehensive MRSA colonization surveillance program. It is not intended to diagnose MRSA infection nor to guide or monitor treatment  for MRSA infections. Test performance is not FDA approved in patients less than 39 years old. Performed at [facility], [address], [place], [place] [address]      Radiology Studies: DG Chest Port 1 View Result Date: 03/11/2024 CLINICAL DATA:  Sepsis EXAM: PORTABLE CHEST 1 VIEW COMPARISON:  February 14, 2024 FINDINGS: The heart size and mediastinal contours are within normal limits. Both lungs are clear. The visualized skeletal structures are unremarkable. IMPRESSION: No active disease. Electronically Signed   By: Lynwood Landy Raddle M.D.   On: 03/11/2024 16:53     Nilda Fendt, MD, PhD [facility]  Between 7 am - 7 pm I am available, please contact me via Amion (for emergencies) or Securechat (non urgent messages)  Between 7 pm - 7 am I am not available, please contact night coverage MD/APP via Amion  "

## 2024-03-12 NOTE — Progress Notes (Signed)
 [facility] ADULT ICU REPLACEMENT PROTOCOL   The patient does apply for the [facility] Adult ICU Electrolyte Replacment Protocol based on the criteria listed below:   1.Exclusion criteria: TCTS, ECMO, Dialysis, and Myasthenia Gravis patients 2. Is GFR >/= 30 ml/min? Yes.    Patient's GFR today is 56 3. Is SCr </= 2? Yes.   Patient's SCr is 1.34 mg/dL 4. Did SCr increase >/= 0.5 in 24 hours? No. 5.Pt's weight >40kg  Yes.   6. Abnormal electrolyte(s): K+ = 3.4, Mg = 1.9  7. Electrolytes replaced per protocol 8.  Call MD STAT for K+ </= 2.5, Phos </= 1, or Mag </= 1 Physician:  Kassie, eMD   Rosina SAILOR Freedom Lopezperez 03/12/2024 5:24 AM

## 2024-03-12 NOTE — Evaluation (Signed)
 Physical Therapy Brief Evaluation and Discharge Note Patient Details Name: Stanley Watkins MRN: [id number] DOB: Nov 12, 1950 Today's Date: 03/12/2024   History of Present Illness  74 year old male comes into the hospital 03/11/24 with dyspnea and fatigue. Hypotension, shock, PAF. PMH: A-fib on Eliquis , HTN, DM2, mild dementia, recent admission in December 2025 for aspiration pneumonia, norovirus, melena status post EGD which ruled out a GI bleed.   Clinical Impression  Patient evaluated by Physical Therapy with no further acute PT needs identified. All education has been completed and the patient has no further questions. Denies dizziness during visit but does not quite feel 100% he reports. Demonstrates some LE weakness with transfers, bracing bed with back of legs to rise, and feels more confident with RW to steady himself initially. Able to ambulate at supervision level with and with RW, no evidence of LOB. SpO2 94% on RA, HR avg in 90s while ambulating. HR did fluctuate low 50s-120s between supine and standing positions (see orthos below.) Would likely benefit from OPPT to maximize function and independence. See below for any follow-up Physical Therapy or equipment needs. PT is signing off. Thank you for this referral.        PT Assessment All further PT needs can be met in the next venue of care  Assistance Needed at Discharge  PRN    Equipment Recommendations Rolling walker (2 wheels)  Recommendations for Other Services  OT consult    Precautions/Restrictions Precautions Precautions: Fall Recall of Precautions/Restrictions: Intact Restrictions Weight Bearing Restrictions Per Provider Order: No        Mobility  Bed Mobility Rolling: Modified independent (Device/Increase time) Supine/Sidelying to sit: Supervision   General bed mobility comments: Supervision to rise to EOB, slow and effortful but performed without physical assistance.  Transfers Overall transfer level:  Needs assistance Equipment used: Rolling walker (2 wheels) Transfers: Sit to/from Stand Sit to Stand: Supervision           General transfer comment: Supervision for safey, bracing back of legs against bed for support, stable upon rising with light RW support. Denies dizziness.    Ambulation/Gait Ambulation/Gait assistance: Supervision Gait Distance (Feet): 315 Feet Assistive device: Rolling walker (2 wheels), None Gait Pattern/deviations: Step-through pattern, Wide base of support, Trunk flexed Gait Speed: Pace WFL General Gait Details: Grossly stable with and without RW for support. Educated on use, initially feeling that he would like the additional stability but towards end of distance able to remove RW and mobilize without evidence of LOB. Denies dizziness, no dyspnea. SpO2 94% on RA, HR in 90s. Supervision for safety.  Home Activity Instructions Home Activity Instructions: Activity as tolerated, would use RW initially if still feels like he needs additional support. Monitor for symtoms of low BP.  Stairs            Modified Rankin (Stroke Patients Only)        Balance Overall balance assessment: Mild deficits observed, not formally tested Sitting-balance support: No upper extremity supported, Feet supported Sitting balance-Leahy Scale: Good     Standing balance support: No upper extremity supported, During functional activity Standing balance-Leahy Scale: Good            Pertinent Vitals/Pain PT - Brief Vital Signs All Vital Signs Stable: Other (comment) (HR fluctuates low 50s to 120s.) Pain Assessment Pain Assessment: No/denies pain     Home Living Family/patient expects to be discharged to:: Private residence Living Arrangements: Spouse/significant other Available Help at Discharge: Family;Available 24 hours/day  Home Equipment: None        Prior Function Level of Independence: Independent Comments: Denies falls    UE/LE Assessment         LE ROM/Strength/Tone/Coordination: Generalized weakness      Communication   Communication Communication: No apparent difficulties     Cognition Overall Cognitive Status: No family/caregiver present to determine baseline cognitive functioning (Hx of dementia)       General Comments General comments (skin integrity, edema, etc.): See orthostatics tab    Exercises     Assessment/Plan    PT Problem List Decreased strength;Decreased cognition;Decreased knowledge of use of DME;Obesity       PT Visit Diagnosis Other abnormalities of gait and mobility (R26.89);Muscle weakness (generalized) (M62.81)    No Skilled PT     Co-evaluation                AMPAC 6 Clicks Help needed turning from your back to your side while in a flat bed without using bedrails?: A Little Help needed moving from lying on your back to sitting on the side of a flat bed without using bedrails?: A Little Help needed moving to and from a bed to a chair (including a wheelchair)?: A Little Help needed standing up from a chair using your arms (e.g., wheelchair or bedside chair)?: A Little Help needed to walk in hospital room?: A Little Help needed climbing 3-5 steps with a railing? : A Little 6 Click Score: 18      End of Session Equipment Utilized During Treatment: Gait belt Activity Tolerance: Patient tolerated treatment well Patient left: in chair;with call bell/phone within reach;with chair alarm set Nurse Communication: Mobility status PT Visit Diagnosis: Other abnormalities of gait and mobility (R26.89);Muscle weakness (generalized) (M62.81)     Time: 9152-9087 PT Time Calculation (min) (ACUTE ONLY): 25 min  Charges:   PT Evaluation $PT Eval Low Complexity: 1 Low PT Treatments $Gait Training: 8-22 mins    Leontine Roads, PT, DPT [facility]  Rehabilitation Services Physical Therapist Office: [phone] Website: [email or web address]   Leontine GORMAN Roads  03/12/2024, 9:27 AM

## 2024-03-13 ENCOUNTER — Other Ambulatory Visit: Payer: Self-pay

## 2024-03-13 DIAGNOSIS — I952 Hypotension due to drugs: Secondary | ICD-10-CM | POA: Diagnosis not present

## 2024-03-13 LAB — COMPREHENSIVE METABOLIC PANEL WITH GFR
ALT: 29 U/L (ref 0–44)
AST: 25 U/L (ref 15–41)
Albumin: 3.7 g/dL (ref 3.5–5.0)
Alkaline Phosphatase: 60 U/L (ref 38–126)
Anion gap: 12 (ref 5–15)
BUN: 17 mg/dL (ref 8–23)
CO2: 24 mmol/L (ref 22–32)
Calcium: 8.8 mg/dL — ABNORMAL LOW (ref 8.9–10.3)
Chloride: 100 mmol/L (ref 98–111)
Creatinine, Ser: 1.07 mg/dL (ref 0.61–1.24)
GFR, Estimated: >60 mL/min
Glucose, Bld: 105 mg/dL — ABNORMAL HIGH (ref 70–99)
Potassium: 3.4 mmol/L — ABNORMAL LOW (ref 3.5–5.1)
Sodium: 135 mmol/L (ref 135–145)
Total Bilirubin: 0.4 mg/dL (ref 0.0–1.2)
Total Protein: 6.9 g/dL (ref 6.5–8.1)

## 2024-03-13 LAB — CBC
HCT: 39.4 % (ref 39.0–52.0)
Hemoglobin: 13.1 g/dL (ref 13.0–17.0)
MCH: 27.5 pg (ref 26.0–34.0)
MCHC: 33.2 g/dL (ref 30.0–36.0)
MCV: 82.6 fL (ref 80.0–100.0)
Platelets: 116 K/uL — ABNORMAL LOW (ref 150–400)
RBC: 4.77 MIL/uL (ref 4.22–5.81)
RDW: 15.3 % (ref 11.5–15.5)
WBC: 3.8 K/uL — ABNORMAL LOW (ref 4.0–10.5)
nRBC: 0 % (ref 0.0–0.2)

## 2024-03-13 LAB — GLUCOSE, CAPILLARY
Glucose-Capillary: 101 mg/dL — ABNORMAL HIGH (ref 70–99)
Glucose-Capillary: 105 mg/dL — ABNORMAL HIGH (ref 70–99)
Glucose-Capillary: 129 mg/dL — ABNORMAL HIGH (ref 70–99)
Glucose-Capillary: 135 mg/dL — ABNORMAL HIGH (ref 70–99)
Glucose-Capillary: 142 mg/dL — ABNORMAL HIGH (ref 70–99)
Glucose-Capillary: 88 mg/dL (ref 70–99)

## 2024-03-13 LAB — MAGNESIUM: Magnesium: 1.8 mg/dL (ref 1.7–2.4)

## 2024-03-13 LAB — PHOSPHORUS: Phosphorus: 3.1 mg/dL (ref 2.5–4.6)

## 2024-03-13 MED ORDER — METOPROLOL TARTRATE 5 MG/5ML IV SOLN: 5.0000 mg | INTRAVENOUS | Status: DC

## 2024-03-13 MED ORDER — METOPROLOL TARTRATE 5 MG/5ML IV SOLN
5.0000 mg | INTRAVENOUS | Status: AC
  Administered 2024-03-13: 5 mg via INTRAVENOUS
  Filled 2024-03-13: qty 5

## 2024-03-13 MED ORDER — METOPROLOL TARTRATE 5 MG/5ML IV SOLN
2.5000 mg | INTRAVENOUS | Status: DC | PRN
  Administered 2024-03-13: 2.5 mg via INTRAVENOUS
  Filled 2024-03-13: qty 5

## 2024-03-13 MED ORDER — DILTIAZEM HCL 30 MG PO TABS
30.0000 mg | ORAL_TABLET | Freq: Four times a day (QID) | ORAL | Status: DC
  Administered 2024-03-13 – 2024-03-14 (×4): 30 mg via ORAL
  Filled 2024-03-13 (×5): qty 1

## 2024-03-13 MED ORDER — METOPROLOL TARTRATE 5 MG/5ML IV SOLN: 2.5000 mg | INTRAVENOUS | Status: DC | PRN

## 2024-03-13 MED ORDER — POTASSIUM CHLORIDE CRYS ER 20 MEQ PO TBCR
40.0000 meq | EXTENDED_RELEASE_TABLET | Freq: Once | ORAL | Status: AC
  Administered 2024-03-13: 40 meq via ORAL
  Filled 2024-03-13: qty 2

## 2024-03-13 MED ORDER — METOPROLOL TARTRATE 5 MG/5ML IV SOLN
5.0000 mg | INTRAVENOUS | Status: DC | PRN
  Filled 2024-03-13: qty 5

## 2024-03-13 NOTE — Plan of Care (Signed)

## 2024-03-13 NOTE — Evaluation (Signed)
 Occupational Therapy Evaluation and Discharge Patient Details Name: Stanley Watkins MRN: [id number] DOB: 16-Dec-1950 Today's Date: 03/13/2024   History of Present Illness   74 year old male comes into the hospital 03/11/24 with dyspnea and fatigue. Hypotension, shock. PMH: A-fib on Eliquis , HTN, DM2, mild dementia, recent admission in December 2025 for aspiration pneumonia, norovirus, melena status post EGD which ruled out a GI bleed,     Clinical Impressions PTA Pt reports he was independent with functional mobility and ADL/IADL tasks. Pt currently requires up to supervision for functional transfers with RW and up to Min A for LB ADL tasks. Pt educated on energy conservation strategies and verbalized understanding, expressing how to implement strategies into ADL routine at home. At this time, Pt expresses he is functioning close to his baseline abilities and does not indicate a need for continued OT services in acute or post acute care. Education provided to Pt. OT to sign off at this time.     If plan is discharge home, recommend the following:   A little help with walking and/or transfers;A little help with bathing/dressing/bathroom;Assistance with cooking/housework;Assist for transportation;Help with stairs or ramp for entrance     Functional Status Assessment   Patient has had a recent decline in their functional status and demonstrates the ability to make significant improvements in function in a reasonable and predictable amount of time.     Equipment Recommendations   None recommended by OT     Recommendations for Other Services         Precautions/Restrictions   Precautions Precautions: Fall Recall of Precautions/Restrictions: Intact Restrictions Weight Bearing Restrictions Per Provider Order: No     Mobility Bed Mobility Overal bed mobility: Modified Independent             General bed mobility comments: Mod I with increased time     Transfers Overall transfer level: Needs assistance Equipment used: Rolling walker (2 wheels) Transfers: Sit to/from Stand Sit to Stand: Supervision           General transfer comment: Supervision for safety to rise from bed slightly elevated. Pt required initial verbal cue for proper hand placement on RW. Took about 3 steps to R towards [facility] as Pt requested to rest to let himself settle from breakfast.      Balance Overall balance assessment: Mild deficits observed, not formally tested Sitting-balance support: No upper extremity supported, Feet supported Sitting balance-Leahy Scale: Good     Standing balance support: Bilateral upper extremity supported, During functional activity Standing balance-Leahy Scale: Good                             ADL either performed or assessed with clinical judgement   ADL Overall ADL's : Needs assistance/impaired Eating/Feeding: Independent   Grooming: Supervision/safety   Upper Body Bathing: Sitting;Independent   Lower Body Bathing: Minimal assistance;Sitting/lateral leans   Upper Body Dressing : Independent   Lower Body Dressing: Minimal assistance;Sitting/lateral leans   Toilet Transfer: Supervision/safety;Rolling walker (2 wheels);Ambulation   Toileting- Clothing Manipulation and Hygiene: Independent               Vision Patient Visual Report: No change from baseline       Perception         Praxis         Pertinent Vitals/Pain Pain Assessment Pain Assessment: No/denies pain     Extremity/Trunk Assessment Upper Extremity Assessment Upper Extremity Assessment: Overall WFL for tasks  assessed   Lower Extremity Assessment Lower Extremity Assessment: Defer to PT evaluation   Cervical / Trunk Assessment Cervical / Trunk Assessment: Normal   Communication Communication Communication: No apparent difficulties   Cognition Arousal: Alert Behavior During Therapy: WFL for tasks  assessed/performed Cognition: No apparent impairments                               Following commands: Intact       Cueing  General Comments   Cueing Techniques: Verbal cues  Pt educated on energy conservation strategies and how to implement strategies into ADL routine. Handout provided to Pt. Allowed Pt time to respond to [facility] education and ask questions as needed. Pt verbalized understanding of strategies.   Exercises     Shoulder Instructions      Home Living Family/patient expects to be discharged to:: Private residence Living Arrangements: Spouse/significant other Available Help at Discharge: Family;Available 24 hours/day Type of Home: House Home Access: Stairs to enter [employer] of Steps: a few   Home Layout: Multi-level;Bed/bath upstairs Alternate Level Stairs-Number of Steps: flight   Bathroom Shower/Tub: Walk-in shower         Home Equipment: None          Prior Functioning/Environment Prior Level of Function : Independent/Modified Independent             Mobility Comments: No assistive device ADLs Comments: Indep with ADLs, IADLs    OT Problem List:     OT Treatment/Interventions:        OT Goals(Current goals can be found in the care plan section)   Acute Rehab OT Goals Patient Stated Goal: to go home   OT Frequency:       Co-evaluation              AM-PAC OT 6 Clicks Daily Activity     Outcome Measure Help from another person eating meals?: None Help from another person taking care of personal grooming?: A Little Help from another person toileting, which includes using toliet, bedpan, or urinal?: A Little Help from another person bathing (including washing, rinsing, drying)?: A Little Help from another person to put on and taking off regular upper body clothing?: None Help from another person to put on and taking off regular lower body clothing?: A Little 6 Click Score: 20   End of Session  Equipment Utilized During Treatment: Rolling walker (2 wheels)  Activity Tolerance: Patient tolerated treatment well Patient left: in bed;with call bell/phone within reach                   Time: 0825-0839 OT Time Calculation (min): 14 min Charges:  OT General Charges $OT Visit: 1 Visit OT Evaluation $OT Eval Low Complexity: 1 Low  Maurilio CROME, OTR/L.  [facility] Acute Rehabilitation  Office: [phone]   Maurilio PARAS Ferlando Lia 03/13/2024, 10:36 AM

## 2024-03-13 NOTE — Plan of Care (Signed)
 Patient is in and out A-fib RVR.  History of asthma atrial fibrillation.  Admitted for hypotension in the setting of polypharmacy.  Currently on Coreg  3.125 mg twice daily.    Initially gave IV Lopressor  2.5 mg and giving second time IV Lopressor  5 mg.  If second Lopressor  will not help improvement of heart rate in that case will give amiodarone bolus followed by amnio drip.  Continue Eliquis  5 mg twice daily.  Somer Trotter, MD [facility] 03/13/2024, 1:28 AM

## 2024-03-13 NOTE — Discharge Instructions (Signed)

## 2024-03-13 NOTE — Progress Notes (Signed)
 " PROGRESS NOTE  Stanley Watkins FMW:[id number] DOB: December 31, 1950 DOA: 03/11/2024 PCP: Shelda Atlas, MD   LOS: 2 days   Brief Narrative / Interim history: 74 year old male with A-fib on Eliquis , HTN, DM2, mild dementia, recent admission in December 2025 for aspiration pneumonia, norovirus, melena status post EGD which ruled out a GI bleed, comes into the hospital with dyspnea and fatigue.  He tells me that he was extremely dizzy.  He was hypotensive with a blood pressure in the field of 60 systolic, received fluids without improvement.  He received 3 more liters of fluids in the ED, without improvement, was placed on Levophed  and admitted to the ICU.  Hypotension resolved, Levophed  was weaned off and TRH was asked to take over on 1/3, however I do not see any transfer orders out of the ICU  Subjective / 24h Interval events: He is doing well today, no chest pain, no shortness of breath.  No abdominal pain, nausea or vomiting.  He has been eating well  Assesement and Plan: Principal problem Hypotension, shock -etiology not entirely clear, he is not septic appearing.  Most recent 2D echocardiogram in December 2025 showed normal EF with grade 1 diastolic dysfunction.  He did develop A-fib during prior hospitalization, and was started on medications for that.  He appears to be on Coreg , amlodipine , diltiazem , along with HCTZ alone and losartan  HCTZ combo.  Per discharge summary in December he was supposed to be only on losartan /HCTZ and amlodipine  was recommended to be discontinued - Per pharmacy reconciliation he appears to be taking all of this medication, including duplicate HCTZ.  At this point, this appears to be the most likely etiology for his hypotension, overmedication. - Obtain orthostatics.  Will try to mobilize with PT if able today  Active problems PAF -yesterday morning he was in sinus rhythm, however he appears to be intermittently flipping back-and-forth into A-fib with RVR in  sinus.  At home he was on Coreg  as well as diltiazem , however he is now normotensive/blood pressure soft at times, and I am concerned about resuming everything at once.  He did require IV metoprolol  overnight.  For improved rate control would favor to initiate diltiazem , but not use a 24-hour formulation and instead Q6 and assess response in terms of his pressure and heart rate - Possibly consolidate tomorrow.  If hypotensive could potentially be transition to oral metoprolol   Acute kidney injury -patient's most recent creatinine during his prior hospitalization ranged tween 0.9 and 1.2, creatinine during this admission at 2.0.  It is now improving, creatinine 1.0 today  Hypokalemia-replenish potassium and continue to monitor.  Magnesium  is stable  DM 2 -continue sliding scale.  CBGs reviewed and acceptable  Essential hypertension-on diltiazem  alone for now.  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  He would benefit from weight loss  Scheduled Meds:  apixaban   5 mg Oral BID   buPROPion   150 mg Oral Daily   Chlorhexidine  Gluconate Cloth  6 each Topical Daily   diltiazem   30 mg Oral Q6H   donepezil   10 mg Oral Daily   doxycycline   100 mg Oral Q12H   gabapentin   100 mg Oral BID   insulin  aspart  0-15 Units Subcutaneous Q4H   pantoprazole   40 mg Oral Daily   Continuous Infusions:   PRN Meds:.mouth rinse, polyethylene glycol, senna  Current Outpatient Medications  Medication Instructions   amLODipine  (NORVASC ) 10  mg, Daily   buPROPion  (WELLBUTRIN  XL) 150 mg, Daily   carvedilol  (COREG ) 6.25 mg, Oral, 2 times daily with meals   diltiazem  (CARDIZEM  CD) 360 mg, Oral, Daily   donepezil  (ARICEPT ) 10 mg, Daily   Eliquis  5 mg, Oral, 2 times daily   gabapentin  (NEURONTIN ) 100 mg, 2 times daily   hydrochlorothiazide  (HYDRODIURIL ) 25 mg, Daily   losartan -hydrochlorothiazide  (HYZAAR) 100-25 MG  tablet 1 tablet, Daily   pantoprazole  (PROTONIX ) 40 mg, Oral, Daily   Vitamin D  (Ergocalciferol ) (DRISDOL) 50,000 Units, Weekly    Diet Orders (From admission, onward)     Start     Ordered   03/11/24 1843  Diet Heart Room service appropriate? Yes; Fluid consistency: Thin  Diet effective now       Question Answer Comment  Room service appropriate? Yes   Fluid consistency: Thin      03/11/24 1843            DVT prophylaxis: SCDs Start: 03/11/24 1842 apixaban  (ELIQUIS ) tablet 5 mg   Lab Results  Component Value Date   PLT 116 (L) 03/13/2024      Code Status: Full Code  Family Communication: No family at bedside  Status is: Inpatient Remains inpatient appropriate because: Severity of illness   Level of care: Telemetry  Consultants:  None  Objective: Vitals:   03/13/24 0500 03/13/24 0540 03/13/24 0804 03/13/24 1014  BP:  119/81 104/69 104/69  Pulse:  92 62   Resp:  18 19   Temp:  98.6 F (37 C) 98.6 F (37 C)   TempSrc:      SpO2:  95% 97%   Weight: 120.6 kg     Height:        Intake/Output Summary (Last 24 hours) at 03/13/2024 1254 Last data filed at 03/13/2024 0805 Gross per 24 hour  Intake 454.94 ml  Output 0 ml  Net 454.94 ml   Wt Readings from Last 3 Encounters:  03/13/24 120.6 kg  03/09/24 126.4 kg  02/15/24 123.4 kg    Examination:  Constitutional: NAD Eyes: lids and conjunctivae normal, no scleral icterus ENMT: mmm Neck: normal, supple Respiratory: clear to auscultation bilaterally, no wheezing, no crackles. Normal respiratory effort.  Cardiovascular: Regular rate and rhythm, no murmurs / rubs / gallops. No LE edema. Abdomen: soft, no distention, no tenderness. Bowel sounds positive.   Data Reviewed: I have independently reviewed following labs and imaging studies   CBC Recent Labs  Lab 03/11/24 1503 03/11/24 1504 03/12/24 0253 03/13/24 0603  WBC  --  5.2 3.8* 3.8*  HGB 15.3 14.5 13.2 13.1  HCT 45.0 44.6 39.8 39.4  PLT  --   146* 119* 116*  MCV  --  84.2 82.7 82.6  MCH  --  27.4 27.4 27.5  MCHC  --  32.5 33.2 33.2  RDW  --  15.6* 15.4 15.3  LYMPHSABS  --  1.6  --   --   MONOABS  --  0.7  --   --   EOSABS  --  0.0  --   --   BASOSABS  --  0.0  --   --     Recent Labs  Lab 03/11/24 1503 03/11/24 1504 03/11/24 1912 03/11/24 2134 03/12/24 0253 03/13/24 0603  NA 137 136  --   --  135 135  K 3.7 3.9  --   --  3.4* 3.4*  CL 99 97*  --   --  100 100  CO2  --  24  --   --  22 24  GLUCOSE 150* 152*  --   --  120* 105*  BUN 30* 29*  --   --  27* 17  CREATININE 2.00* 1.87*  --   --  1.34* 1.07  CALCIUM   --  9.0  --   --  8.7* 8.8*  AST  --  35  --   --   --  25  ALT  --  34  --   --   --  29  ALKPHOS  --  69  --   --   --  60  BILITOT  --  0.4  --   --   --  0.4  ALBUMIN  --  3.7  --   --   --  3.7  MG  --   --   --   --  1.9 1.8  PROCALCITON  --   --   --  0.31  --   --   LATICACIDVEN  --  2.1* 3.0* 2.2*  --   --   INR  --  1.4*  --   --   --   --     ------------------------------------------------------------------------------------------------------------------ No results for input(s): CHOL, HDL, LDLCALC, TRIG, CHOLHDL, LDLDIRECT in the last 72 hours.  Lab Results  Component Value Date   HGBA1C 7.2 (H) 02/10/2024   ------------------------------------------------------------------------------------------------------------------ No results for input(s): TSH, T4TOTAL, T3FREE, THYROIDAB in the last 72 hours.  Invalid input(s): FREET3  Cardiac Enzymes No results for input(s): CKMB, TROPONINI, MYOGLOBIN in the last 168 hours.  Invalid input(s): CK ------------------------------------------------------------------------------------------------------------------ No results found for: BNP  CBG: Recent Labs  Lab 03/12/24 1947 03/13/24 0023 03/13/24 0437 03/13/24 0803 03/13/24 1131  GLUCAP 120* 129* 101* 135* 142*    Recent Results (from the past 240  hours)  Blood Culture (routine x 2)     Status: None (Preliminary result)   Collection Time: 03/11/24  2:50 PM   Specimen: BLOOD  Result Value Ref Range Status   Specimen Description BLOOD RIGHT ANTECUBITAL  Final   Special Requests   Final    BOTTLES DRAWN AEROBIC AND ANAEROBIC Blood Culture adequate volume   Culture   Final    NO GROWTH 2 DAYS Performed at [facility], [address], [place], [place] [address]    Report Status PENDING  Incomplete  Blood Culture (routine x 2)     Status: None (Preliminary result)   Collection Time: 03/11/24  2:50 PM   Specimen: BLOOD  Result Value Ref Range Status   Specimen Description BLOOD SITE NOT SPECIFIED  Final   Special Requests   Final    BOTTLES DRAWN AEROBIC AND ANAEROBIC Blood Culture results may not be optimal due to an inadequate volume of blood received in culture bottles   Culture   Final    NO GROWTH 2 DAYS Performed at [facility], [address], [place], [place] [address]    Report Status PENDING  Incomplete  MRSA Next Gen by PCR, Nasal     Status: None   Collection Time: 03/11/24  6:42 PM   Specimen: Nasal Mucosa; Nasal Swab  Result Value Ref Range Status   MRSA by PCR Next Gen NOT DETECTED NOT DETECTED Final    Comment: (NOTE) The GeneXpert MRSA Assay (FDA approved for NASAL specimens only), is one component of a comprehensive MRSA colonization surveillance program. It is not intended to diagnose MRSA infection nor to  guide or monitor treatment for MRSA infections. Test performance is not FDA approved in patients less than 39 years old. Performed at [facility], [address], [place], [place] [address]      Radiology Studies: No results found.    Nilda Fendt, MD, PhD [facility]  Between 7 am - 7 pm I am available, please contact me via Amion (for emergencies) or Securechat (non urgent messages)  Between 7 pm - 7 am I am not available, please contact night coverage MD/APP via  Amion  "

## 2024-03-14 ENCOUNTER — Other Ambulatory Visit (HOSPITAL_COMMUNITY): Payer: Self-pay

## 2024-03-14 DIAGNOSIS — I952 Hypotension due to drugs: Secondary | ICD-10-CM | POA: Diagnosis not present

## 2024-03-14 LAB — BASIC METABOLIC PANEL WITH GFR
Anion gap: 11 (ref 5–15)
BUN: 12 mg/dL (ref 8–23)
CO2: 25 mmol/L (ref 22–32)
Calcium: 8.7 mg/dL — ABNORMAL LOW (ref 8.9–10.3)
Chloride: 102 mmol/L (ref 98–111)
Creatinine, Ser: 1.02 mg/dL (ref 0.61–1.24)
GFR, Estimated: >60 mL/min
Glucose, Bld: 146 mg/dL — ABNORMAL HIGH (ref 70–99)
Potassium: 3.4 mmol/L — ABNORMAL LOW (ref 3.5–5.1)
Sodium: 138 mmol/L (ref 135–145)

## 2024-03-14 LAB — GLUCOSE, CAPILLARY
Glucose-Capillary: 132 mg/dL — ABNORMAL HIGH (ref 70–99)
Glucose-Capillary: 159 mg/dL — ABNORMAL HIGH (ref 70–99)
Glucose-Capillary: 118 mg/dL — ABNORMAL HIGH (ref 70–99)
Glucose-Capillary: 102 mg/dL — ABNORMAL HIGH (ref 70–99)

## 2024-03-14 LAB — MAGNESIUM: Magnesium: 1.9 mg/dL (ref 1.7–2.4)

## 2024-03-14 MED ORDER — DILTIAZEM HCL ER COATED BEADS 120 MG PO CP24
120.0000 mg | ORAL_CAPSULE | Freq: Every day | ORAL | 0 refills | Status: AC
  Filled 2024-03-14: qty 90, 90d supply, fill #0

## 2024-03-14 MED ORDER — CARVEDILOL 3.125 MG PO TABS
3.1250 mg | ORAL_TABLET | Freq: Two times a day (BID) | ORAL | Status: DC
  Administered 2024-03-14: 3.125 mg via ORAL
  Filled 2024-03-14: qty 1

## 2024-03-14 MED ORDER — DILTIAZEM HCL ER COATED BEADS 120 MG PO CP24
120.0000 mg | ORAL_CAPSULE | Freq: Every day | ORAL | Status: DC
  Administered 2024-03-14: 120 mg via ORAL
  Filled 2024-03-14: qty 1

## 2024-03-14 NOTE — Progress Notes (Addendum)
 CBG 132 at this time. Not transferring over.   [name]

## 2024-03-14 NOTE — Discharge Summary (Signed)
 "  Physician Discharge Summary  Stanley Watkins FMW:[id number] DOB: 1950-07-30 DOA: 03/11/2024  PCP: Shelda Atlas, MD  Admit date: 03/11/2024 Discharge date: 03/14/2024  Admitted From: home Disposition:  home  Recommendations for Outpatient Follow-up:  Follow up with PCP in 1-2 weeks  Home Health: none Equipment/Devices: none  Discharge Condition: stable CODE STATUS: Full code Diet Orders (From admission, onward)     Start     Ordered   03/11/24 1843  Diet Heart Room service appropriate? Yes; Fluid consistency: Thin  Diet effective now       Question Answer Comment  Room service appropriate? Yes   Fluid consistency: Thin      03/11/24 1843           Brief Narrative / Interim history: 74 year old male with A-fib on Eliquis , HTN, DM2, mild dementia, recent admission in December 2025 for aspiration pneumonia, norovirus, melena status post EGD which ruled out a GI bleed, comes into the hospital with dyspnea and fatigue.  He tells me that he was extremely dizzy.  He was hypotensive with a blood pressure in the field of 60 systolic, received fluids without improvement.  He received 3 more liters of fluids in the ED, without improvement, was placed on Levophed  and admitted to the ICU.  Hypotension resolved, Levophed  was weaned off and TRH was asked to take over on 1/3, however I do not see any transfer orders out of the ICU  Hospital Course / Discharge diagnoses: Principal problem Hypotension, shock -etiology not entirely clear, he is not septic appearing.  Most recent 2D echocardiogram in December 2025 showed normal EF with grade 1 diastolic dysfunction.  He did develop A-fib during prior hospitalization, and was started on medications for that.  He appears to be on Coreg , amlodipine , diltiazem , along with HCTZ alone and losartan  HCTZ combo.  Per discharge summary in December he was supposed to be only on losartan /HCTZ and amlodipine  was recommended to be discontinued. Per pharmacy  reconciliation he appears to be taking all of this medication, including duplicate HCTZ.  At this point, this appears to be the most likely etiology for his hypotension, overmedication.  Shock has resolved, diltiazem  at a lower dose and Coreg  were reintroduced, blood pressure has remained stable, he will be discharged home on this to medications on discharge.  Patient and wife were advised to follow-up very closely with the PCP to recheck the blood pressure as further medication adjustments may be necessary   Active problems PAF -he has remained mostly in sinus rhythm with PVCs, with stable rates, on diltiazem  and Coreg  at that the doses below Acute kidney injury -patient's most recent creatinine during his prior hospitalization ranged tween 0.9 and 1.2, creatinine during this admission at 2.0.  It is now at baseline  Hypokalemia-replenish potassium prior to discharge and continue to monitor in an outpatient setting.  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  He would benefit from weight loss  Sepsis ruled out   Discharge Instructions   Allergies as of 03/14/2024   No Known Allergies      Medication List     STOP taking these medications    amLODipine  10 MG tablet Commonly known as: NORVASC    hydrochlorothiazide  25 MG tablet Commonly known as: HYDRODIURIL    losartan -hydrochlorothiazide  100-25 MG tablet Commonly known as: HYZAAR       TAKE these  medications    buPROPion  150 MG 24 hr tablet Commonly known as: WELLBUTRIN  XL Take 150 mg by mouth daily.   carvedilol  6.25 MG tablet Commonly known as: COREG  Take 1 tablet (6.25 mg total) by mouth 2 (two) times daily with a meal.   diltiazem  120 MG 24 hr capsule Commonly known as: CARDIZEM  CD Take 1 capsule (120 mg total) by mouth daily. Start taking  on: March 15, 2024 What changed:  medication strength how much to take   donepezil  10 MG tablet Commonly known as: ARICEPT  Take 10 mg by mouth daily.   Eliquis  5 MG Tabs tablet Generic drug: apixaban  Take 1 tablet (5 mg total) by mouth 2 (two) times daily.   gabapentin  100 MG capsule Commonly known as: NEURONTIN  Take 100 mg by mouth 2 (two) times daily.   pantoprazole  40 MG tablet Commonly known as: PROTONIX  Take 1 tablet (40 mg total) by mouth daily.   Vitamin D  (Ergocalciferol ) 1.25 MG (50000 UNIT) Caps capsule Commonly known as: DRISDOL Take 50,000 Units by mouth once a week.        Follow-up Information     Shelda Atlas, MD Follow up in 1 week(s).   Specialty: Internal Medicine Contact information[address]ETHA BARTHEL [address] [place] [address] [phone]                 Consultations: PCCM  Procedures/Studies:  DG Chest Port 1 View Result Date: 03/11/2024 CLINICAL DATA:  Sepsis EXAM: PORTABLE CHEST 1 VIEW COMPARISON:  February 14, 2024 FINDINGS: The heart size and mediastinal contours are within normal limits. Both lungs are clear. The visualized skeletal structures are unremarkable. IMPRESSION: No active disease. Electronically Signed   By: Lynwood Landy Raddle M.D.   On: 03/11/2024 16:53   DG Abd Portable 1V Result Date: 02/14/2024 EXAM: 1 VIEW XRAY OF THE ABDOMEN 02/14/2024 08:14:00 AM COMPARISON: None available. CLINICAL HISTORY: Hypoxia, emphysema, GI bleed. FINDINGS: BOWEL: Nonobstructive bowel gas pattern. SOFT TISSUES: No abnormal calcifications. BONES: No acute fracture. DISCS/DEGENERATIVE CHANGES: Lower thoracic and lumbar spondylosis. IMPRESSION: 1. No acute abdominal findings. 2. Lower thoracic and lumbar spondylosis. Electronically signed by: Ryan Salvage MD 02/14/2024 12:41 PM EST RP Workstation: [id number]   DG CHEST PORT 1 VIEW Result Date: 02/14/2024 EXAM: 1 VIEW XRAY OF THE CHEST 02/14/2024 08:14:00 AM COMPARISON: 02/12/2024 CLINICAL  HISTORY: Hypoxia, emphysema, gastrointestinal bleed. FINDINGS: LUNGS AND PLEURA: No focal pulmonary opacity. No pleural effusion. No pneumothorax. HEART AND MEDIASTINUM: No acute abnormality of the cardiac and mediastinal silhouettes. BONES AND SOFT TISSUES: No acute osseous abnormality. IMPRESSION: 1. No acute cardiopulmonary process. Electronically signed by: Ryan Salvage MD 02/14/2024 12:40 PM EST RP Workstation: [id number]     Subjective: - no chest pain, shortness of breath, no abdominal pain, nausea or vomiting.   Discharge Exam: BP 136/78   Pulse 92   Temp 98.4 F (36.9 C)   Resp 18   Ht 5' 10.98 (1.803 m)   Wt 123.4 kg   SpO2 94%   BMI 37.96 kg/m   General: Pt is alert, awake, not in acute distress Cardiovascular: RRR, S1/S2 +, no rubs, no gallops Respiratory: CTA bilaterally, no wheezing, no rhonchi Abdominal: Soft, NT, ND, bowel sounds + Extremities: no edema, no cyanosis    The results of significant diagnostics from this hospitalization (including imaging, microbiology, ancillary and laboratory) are listed below for reference.     Microbiology: Recent Results (from the past 240 hours)  Blood Culture (routine x 2)     Status:  None (Preliminary result)   Collection Time: 03/11/24  2:50 PM   Specimen: BLOOD  Result Value Ref Range Status   Specimen Description BLOOD RIGHT ANTECUBITAL  Final   Special Requests   Final    BOTTLES DRAWN AEROBIC AND ANAEROBIC Blood Culture adequate volume   Culture   Final    NO GROWTH 3 DAYS Performed at [facility], [address], [place], [place] [address]    Report Status PENDING  Incomplete  Blood Culture (routine x 2)     Status: None (Preliminary result)   Collection Time: 03/11/24  2:50 PM   Specimen: BLOOD  Result Value Ref Range Status   Specimen Description BLOOD SITE NOT SPECIFIED  Final   Special Requests   Final    BOTTLES DRAWN AEROBIC AND ANAEROBIC Blood Culture results may not be optimal due to an  inadequate volume of blood received in culture bottles   Culture   Final    NO GROWTH 3 DAYS Performed at [facility], [address], [place], [place] [address]    Report Status PENDING  Incomplete  MRSA Next Gen by PCR, Nasal     Status: None   Collection Time: 03/11/24  6:42 PM   Specimen: Nasal Mucosa; Nasal Swab  Result Value Ref Range Status   MRSA by PCR Next Gen NOT DETECTED NOT DETECTED Final    Comment: (NOTE) The GeneXpert MRSA Assay (FDA approved for NASAL specimens only), is one component of a comprehensive MRSA colonization surveillance program. It is not intended to diagnose MRSA infection nor to guide or monitor treatment for MRSA infections. Test performance is not FDA approved in patients less than 29 years old. Performed at [facility], [address] [place], [place] [address]      Labs: Basic Metabolic Panel: Recent Labs  Lab 03/11/24 1503 03/11/24 1504 03/12/24 0253 03/13/24 0603 03/14/24 0809  NA 137 136 135 135 138  K 3.7 3.9 3.4* 3.4* 3.4*  CL 99 97* 100 100 102  CO2  --  24 22 24 25   GLUCOSE 150* 152* 120* 105* 146*  BUN 30* 29* 27* 17 12  CREATININE 2.00* 1.87* 1.34* 1.07 1.02  CALCIUM   --  9.0 8.7* 8.8* 8.7*  MG  --   --  1.9 1.8 1.9  PHOS  --   --  3.3 3.1  --    Liver Function Tests: Recent Labs  Lab 03/11/24 1504 03/13/24 0603  AST 35 25  ALT 34 29  ALKPHOS 69 60  BILITOT 0.4 0.4  PROT 7.6 6.9  ALBUMIN 3.7 3.7   CBC: Recent Labs  Lab 03/11/24 1503 03/11/24 1504 03/12/24 0253 03/13/24 0603  WBC  --  5.2 3.8* 3.8*  NEUTROABS  --  2.9  --   --   HGB 15.3 14.5 13.2 13.1  HCT 45.0 44.6 39.8 39.4  MCV  --  84.2 82.7 82.6  PLT  --  146* 119* 116*   CBG: Recent Labs  Lab 03/13/24 1941 03/13/24 2350 03/14/24 0359 03/14/24 0737 03/14/24 1136  GLUCAP 105* 132* 159* 118* 102*   Hgb A1c No results for input(s): HGBA1C in the last 72 hours. Lipid Profile No results for input(s): CHOL, HDL,  LDLCALC, TRIG, CHOLHDL, LDLDIRECT in the last 72 hours. Thyroid function studies No results for input(s): TSH, T4TOTAL, T3FREE, THYROIDAB in the last 72 hours.  Invalid input(s): FREET3 Urinalysis    Component Value Date/Time   COLORURINE  YELLOW 03/11/2024 1856   APPEARANCEUR HAZY (A) 03/11/2024 1856   LABSPEC 1.009 03/11/2024 1856   PHURINE 5.0 03/11/2024 1856   GLUCOSEU NEGATIVE 03/11/2024 1856   HGBUR NEGATIVE 03/11/2024 1856   BILIRUBINUR NEGATIVE 03/11/2024 1856   KETONESUR 5 (A) 03/11/2024 1856   PROTEINUR NEGATIVE 03/11/2024 1856   UROBILINOGEN 0.2 04/10/2014 1914   NITRITE NEGATIVE 03/11/2024 1856   LEUKOCYTESUR NEGATIVE 03/11/2024 1856    FURTHER DISCHARGE INSTRUCTIONS:   Get Medicines reviewed and adjusted: Please take all your medications with you for your next visit with your Primary MD   Laboratory/radiological data: Please request your Primary MD to go over all hospital tests and procedure/radiological results at the follow up, please ask your Primary MD to get all Hospital records sent to his/her office.   In some cases, they will be blood work, cultures and biopsy results pending at the time of your discharge. Please request that your primary care M.D. goes through all the records of your hospital data and follows up on these results.   Also Note the following: If you experience worsening of your admission symptoms, develop shortness of breath, life threatening emergency, suicidal or homicidal thoughts you must seek medical attention immediately by calling 911 or calling your MD immediately  if symptoms less severe.   You must read complete instructions/literature along with all the possible adverse reactions/side effects for all the Medicines you take and that have been prescribed to you. Take any new Medicines after you have completely understood and accpet all the possible adverse reactions/side effects.    Do not drive when taking Pain  medications or sleeping medications (Benzodaizepines)   Do not take more than prescribed Pain, Sleep and Anxiety Medications. It is not advisable to combine anxiety,sleep and pain medications without talking with your primary care practitioner   Special Instructions: If you have smoked or chewed Tobacco  in the last 2 yrs please stop smoking, stop any regular Alcohol  and or any Recreational drug use.   Wear Seat belts while driving.   Please note: You were cared for by a hospitalist during your hospital stay. Once you are discharged, your primary care physician will handle any further medical issues. Please note that NO REFILLS for any discharge medications will be authorized once you are discharged, as it is imperative that you return to your primary care physician (or establish a relationship with a primary care physician if you do not have one) for your post hospital discharge needs so that they can reassess your need for medications and monitor your lab values.  Time coordinating discharge: 35 minutes  SIGNED:  Nilda Fendt, MD, PhD 03/14/2024, 2:46 PM   "

## 2024-03-14 NOTE — TOC Transition Note (Signed)
 Transition of Watkins [facility]) - Discharge Note   Patient Details  Name: Stanley Watkins MRN: [id number] Date of Birth: Apr 06, 1950  Transition of [facility]) CM/SW Contact:  Stanley Watkins, Stanley Watkins [name], RN Phone Number: 03/14/2024, 2:54 PM   Clinical Narrative:     Patient is scheduled for discharge today.  Readmission Risk Assessment done. Outpatient PT recommended, patient states he is currently active with [facility] and would like to continue at discharge. Referral sent via Hub and CM spoke with Stanley Watkins with acceptance voiced.  Home health resumption of Watkins info, outpatient f/u, Watkins f/u and discharge instructions on AVS. Prescriptions sent to [facility] pharmacy and patient will receive meds prior discharge. Wife Stanley Watkins at bedside and will transport at discharge.  No further ICM needs noted.        Final next level of Watkins: Home w Home Health Services Barriers to Discharge: Barriers Resolved   Patient Goals and CMS Choice Patient states their goals for this hospitalization and ongoing recovery are:: To return home CMS Medicare.gov Compare Post Acute Watkins list provided to:: Patient Choice offered to / list presented to : Patient, Spouse      Discharge Placement                Patient to be transferred to facility by: Wife Name of family member notified: Stanley Watkins    Discharge Plan and Services Additional resources added to the After Visit Summary for                  DME Arranged: N/A DME Agency: NA       HH Arranged: PT, OT HH Agency: Stanley Watkins Date [facility] Watkins Agency Contacted: 03/14/24 Time HH Agency Contacted: 1450 Representative spoke with at [facility] Agency: [employer] and [employer].  Social Drivers of Health (SDOH) Interventions SDOH Screenings   Food Insecurity: No Food Insecurity (03/11/2024)  Housing: Low Risk (03/11/2024)  Transportation Needs: No Transportation Needs (03/11/2024)  Utilities: Not At Risk (03/11/2024)  Social Connections: Patient  Declined (03/11/2024)  Recent Concern: Social Connections - Socially Isolated (02/10/2024)  Tobacco Use: Low Risk (03/11/2024)     Readmission Risk Interventions    03/14/2024    2:53 PM 02/12/2024    4:50 PM  Readmission Risk Prevention Plan  Post Dischage Appt  Complete  Medication Screening  Complete  Transportation Screening Complete Complete  PCP or Specialist Appt within 5-7 Days Complete   Home Watkins Screening Complete   Medication Review (RN CM) Referral to Pharmacy

## 2024-03-15 NOTE — Care Management Important Message (Signed)
 Important Message  Patient Details  Name: Stanley Watkins MRN: [id number] Date of Birth: [birth year]   Important Message Given:  Yes - Medicare IM     Claretta Deed 03/15/2024, 10:07 AM

## 2024-03-16 LAB — CULTURE, BLOOD (ROUTINE X 2)
Culture: NO GROWTH
Culture: NO GROWTH
Special Requests: ADEQUATE

## 2024-03-23 ENCOUNTER — Other Ambulatory Visit (HOSPITAL_COMMUNITY): Payer: Self-pay

## 2024-03-23 ENCOUNTER — Other Ambulatory Visit: Payer: Self-pay

## 2024-03-31 ENCOUNTER — Other Ambulatory Visit (HOSPITAL_COMMUNITY): Payer: Self-pay

## 2024-04-12 ENCOUNTER — Ambulatory Visit (INDEPENDENT_AMBULATORY_CARE_PROVIDER_SITE_OTHER): Admitting: Podiatry

## 2024-04-12 ENCOUNTER — Encounter: Payer: Self-pay | Admitting: Podiatry

## 2024-04-12 DIAGNOSIS — M79675 Pain in left toe(s): Secondary | ICD-10-CM

## 2024-04-12 DIAGNOSIS — M79674 Pain in right toe(s): Secondary | ICD-10-CM

## 2024-04-12 DIAGNOSIS — B351 Tinea unguium: Secondary | ICD-10-CM

## 2024-04-12 NOTE — Progress Notes (Signed)
 Patient presents for evaluation and treatment of tenderness and some redness around nails feet.  Tenderness around toes with walking and wearing shoes.  Physical exam:  General appearance: Alert, pleasant, and in no acute distress.  Vascular: Pedal pulses: DP 2/4 B/L, PT 0/4 B/L.  Moderate edema lower legs bilaterally.  Capillary refill time immediate bilaterally  Neurologic:  Dermatologic:  Nails thickened, disfigured, discolored 1-5 BL with subungual debris.  Redness and hypertrophic nail folds along nail folds bilaterally but no signs of drainage or infection.  Musculoskeletal:     Diagnosis: 1. Painful onychomycotic nails 1 through 5 bilaterally. 2. Pain toes 1 through 5 bilaterally.  Plan: -Debrided onychomycotic nails 1 through 5 bilaterally.  Sharply debrided nails with nail clipper and reduced with a power bur.  Return 3 months [facility]

## 2024-07-11 ENCOUNTER — Ambulatory Visit: Admitting: Podiatry

## 2024-09-06 ENCOUNTER — Ambulatory Visit (HOSPITAL_COMMUNITY): Admitting: Nurse Practitioner
